# Patient Record
Sex: Male | Born: 1980 | Race: White | Hispanic: No | Marital: Single | Smoking: Current every day smoker
Health system: Southern US, Community
[De-identification: ages and names within clinical notes are randomized; demographics above are authoritative.]

## PROBLEM LIST (undated history)

## (undated) ENCOUNTER — Emergency Department (HOSPITAL_COMMUNITY): Admission: EM | Discharge status: Absent without leave | Payer: Self-pay

## (undated) DIAGNOSIS — D649 Anemia, unspecified: Secondary | ICD-10-CM

## (undated) DIAGNOSIS — G43909 Migraine, unspecified, not intractable, without status migrainosus: Secondary | ICD-10-CM

## (undated) DIAGNOSIS — M1990 Unspecified osteoarthritis, unspecified site: Secondary | ICD-10-CM

## (undated) DIAGNOSIS — K5900 Constipation, unspecified: Secondary | ICD-10-CM

## (undated) DIAGNOSIS — N179 Acute kidney failure, unspecified: Secondary | ICD-10-CM

## (undated) DIAGNOSIS — R519 Headache, unspecified: Secondary | ICD-10-CM

## (undated) DIAGNOSIS — L409 Psoriasis, unspecified: Secondary | ICD-10-CM

## (undated) DIAGNOSIS — F1120 Opioid dependence, uncomplicated: Secondary | ICD-10-CM

## (undated) DIAGNOSIS — K219 Gastro-esophageal reflux disease without esophagitis: Secondary | ICD-10-CM

## (undated) DIAGNOSIS — N189 Chronic kidney disease, unspecified: Secondary | ICD-10-CM

## (undated) DIAGNOSIS — F419 Anxiety disorder, unspecified: Secondary | ICD-10-CM

## (undated) DIAGNOSIS — K759 Inflammatory liver disease, unspecified: Secondary | ICD-10-CM

## (undated) DIAGNOSIS — IMO0001 Reserved for inherently not codable concepts without codable children: Secondary | ICD-10-CM

## (undated) DIAGNOSIS — I5189 Other ill-defined heart diseases: Secondary | ICD-10-CM

## (undated) DIAGNOSIS — D696 Thrombocytopenia, unspecified: Secondary | ICD-10-CM

## (undated) DIAGNOSIS — R51 Headache: Secondary | ICD-10-CM

## (undated) DIAGNOSIS — Z992 Dependence on renal dialysis: Secondary | ICD-10-CM

## (undated) DIAGNOSIS — F1910 Other psychoactive substance abuse, uncomplicated: Secondary | ICD-10-CM

## (undated) DIAGNOSIS — I10 Essential (primary) hypertension: Secondary | ICD-10-CM

## (undated) HISTORY — PX: OTHER SURGICAL HISTORY: SHX169

---

## 2007-08-22 ENCOUNTER — Emergency Department (HOSPITAL_COMMUNITY): Admission: EM | Admit: 2007-08-22 | Discharge: 2007-08-22 | Payer: Self-pay | Admitting: Emergency Medicine

## 2014-10-18 DIAGNOSIS — K759 Inflammatory liver disease, unspecified: Secondary | ICD-10-CM

## 2014-10-18 HISTORY — DX: Inflammatory liver disease, unspecified: K75.9

## 2014-12-25 ENCOUNTER — Emergency Department (HOSPITAL_COMMUNITY): Payer: Medicaid Other

## 2014-12-25 ENCOUNTER — Encounter (HOSPITAL_COMMUNITY): Payer: Self-pay

## 2014-12-25 ENCOUNTER — Emergency Department (HOSPITAL_COMMUNITY)
Admission: EM | Admit: 2014-12-25 | Discharge: 2014-12-25 | Disposition: A | Discharge status: Home / Self Care | Payer: Medicaid Other | Attending: Emergency Medicine | Admitting: Emergency Medicine

## 2014-12-25 DIAGNOSIS — I10 Essential (primary) hypertension: Secondary | ICD-10-CM

## 2014-12-25 DIAGNOSIS — R519 Headache, unspecified: Secondary | ICD-10-CM

## 2014-12-25 DIAGNOSIS — Z720 Tobacco use: Secondary | ICD-10-CM | POA: Diagnosis not present

## 2014-12-25 DIAGNOSIS — Z79899 Other long term (current) drug therapy: Secondary | ICD-10-CM | POA: Insufficient documentation

## 2014-12-25 DIAGNOSIS — Z880 Allergy status to penicillin: Secondary | ICD-10-CM | POA: Insufficient documentation

## 2014-12-25 DIAGNOSIS — R51 Headache: Secondary | ICD-10-CM | POA: Insufficient documentation

## 2014-12-25 DIAGNOSIS — N289 Disorder of kidney and ureter, unspecified: Secondary | ICD-10-CM | POA: Insufficient documentation

## 2014-12-25 DIAGNOSIS — Z872 Personal history of diseases of the skin and subcutaneous tissue: Secondary | ICD-10-CM | POA: Diagnosis not present

## 2014-12-25 HISTORY — DX: Psoriasis, unspecified: L40.9

## 2014-12-25 LAB — CBC WITH DIFFERENTIAL/PLATELET
BASOS ABS: 0.1 10*3/uL (ref 0.0–0.1)
BASOS PCT: 1 % (ref 0–1)
EOS PCT: 1 % (ref 0–5)
Eosinophils Absolute: 0.1 10*3/uL (ref 0.0–0.7)
HEMATOCRIT: 42.9 % (ref 39.0–52.0)
Hemoglobin: 15 g/dL (ref 13.0–17.0)
LYMPHS ABS: 1.9 10*3/uL (ref 0.7–4.0)
LYMPHS PCT: 30 % (ref 12–46)
MCH: 30.3 pg (ref 26.0–34.0)
MCHC: 35 g/dL (ref 30.0–36.0)
MCV: 86.7 fL (ref 78.0–100.0)
Monocytes Absolute: 0.6 10*3/uL (ref 0.1–1.0)
Monocytes Relative: 10 % (ref 3–12)
Neutro Abs: 3.7 10*3/uL (ref 1.7–7.7)
Neutrophils Relative %: 58 % (ref 43–77)
Platelets: 249 10*3/uL (ref 150–400)
RBC: 4.95 MIL/uL (ref 4.22–5.81)
RDW: 11.8 % (ref 11.5–15.5)
WBC: 6.4 10*3/uL (ref 4.0–10.5)

## 2014-12-25 LAB — BASIC METABOLIC PANEL
ANION GAP: 9 (ref 5–15)
BUN: 35 mg/dL — AB (ref 6–23)
CALCIUM: 9.3 mg/dL (ref 8.4–10.5)
CO2: 29 mmol/L (ref 19–32)
Chloride: 95 mmol/L — ABNORMAL LOW (ref 96–112)
Creatinine, Ser: 2.42 mg/dL — ABNORMAL HIGH (ref 0.50–1.35)
GFR calc non Af Amer: 33 mL/min — ABNORMAL LOW (ref >90)
GFR, EST AFRICAN AMERICAN: 38 mL/min — AB (ref >90)
GLUCOSE: 145 mg/dL — AB (ref 70–99)
Potassium: 3.7 mmol/L (ref 3.5–5.1)
Sodium: 133 mmol/L — ABNORMAL LOW (ref 135–145)

## 2014-12-25 MED ORDER — ONDANSETRON HCL 4 MG/2ML IJ SOLN
4.0000 mg | Freq: Once | INTRAMUSCULAR | Status: AC
  Administered 2014-12-25: 4 mg via INTRAVENOUS
  Filled 2014-12-25: qty 2

## 2014-12-25 MED ORDER — METOCLOPRAMIDE HCL 5 MG/ML IJ SOLN
10.0000 mg | Freq: Once | INTRAMUSCULAR | Status: AC
  Administered 2014-12-25: 10 mg via INTRAVENOUS
  Filled 2014-12-25: qty 2

## 2014-12-25 MED ORDER — ENALAPRILAT 1.25 MG/ML IV SOLN
1.2500 mg | Freq: Once | INTRAVENOUS | Status: AC
  Administered 2014-12-25: 1.25 mg via INTRAVENOUS
  Filled 2014-12-25: qty 2

## 2014-12-25 MED ORDER — CLONIDINE HCL 0.1 MG PO TABS: 0.1000 mg | ORAL_TABLET | Freq: Two times a day (BID) | ORAL | Status: DC

## 2014-12-25 MED ORDER — MORPHINE SULFATE 4 MG/ML IJ SOLN: 4.0000 mg | INTRAMUSCULAR | Status: DC | PRN

## 2014-12-25 MED ORDER — SODIUM CHLORIDE 0.9 % IV BOLUS (SEPSIS)
1000.0000 mL | Freq: Once | INTRAVENOUS | Status: AC
  Administered 2014-12-25: 1000 mL via INTRAVENOUS

## 2014-12-25 MED ORDER — VALPROATE SODIUM 500 MG/5ML IV SOLN
500.0000 mg | Freq: Once | INTRAVENOUS | Status: AC
  Administered 2014-12-25: 500 mg via INTRAVENOUS
  Filled 2014-12-25: qty 5

## 2014-12-25 MED ORDER — ONDANSETRON 4 MG PO TBDP: 4.0000 mg | ORAL_TABLET | Freq: Three times a day (TID) | ORAL | Status: DC | PRN

## 2014-12-25 MED ORDER — KETOROLAC TROMETHAMINE 30 MG/ML IJ SOLN
30.0000 mg | Freq: Once | INTRAMUSCULAR | Status: AC
  Administered 2014-12-25: 30 mg via INTRAVENOUS
  Filled 2014-12-25: qty 1

## 2014-12-25 NOTE — ED Notes (Signed)
Pt c/o constant headache x 4 days.  Reports vomiting the past 2 days.  Pt says took someone else's migraine medication last night and says it helped a little.

## 2014-12-25 NOTE — Discharge Instructions (Signed)
Take prescribed blood pressure medicine. Do not take aspirin, Motrin, naproxen, BC powders. Tylenol only for pain. Check your kidney function blood tests and a follow-up appointment with primary care physician within the week. Return if any worsening vomiting, swelling in her feet, shortness of breath, return of headache, or uncontrolled blood pressure.  [facility] Primary Care Doctor List    Sinda Du MD. Specialty: Pulmonary Disease Contact information: [address]  [place] [address]  [phone]   Tula Nakayama, MD. Specialty: [facility] Medicine Contact information: [address] [place] [address]  [phone]   Sallee Lange, MD. Specialty: [facility] Medicine Contact information: [address]  [place] [address]  [phone]   Rosita Fire, MD Specialty: Internal Medicine Contact information: [place] [address]  [phone]   Delphina Cahill, MD. Specialty: Internal Medicine Contact information: [place] [address]  [phone]   Marjean Donna, MD. Specialty: Family Medicine Contact information: [place] [address]  [phone]   Leslie Andrea, MD. Specialty: Family Medicine Contact information: [place] [address]  [phone]   Asencion Noble, MD. Specialty: Internal Medicine Contact information: [address]  [place] [address]  [phone]     General Headache Without Cause A headache is pain or discomfort felt around the head or neck area. The specific cause of a headache may not be found. There are many causes and types of headaches. A few common ones are:  Tension headaches.  Migraine headaches.  Cluster headaches.  Chronic daily headaches. HOME CARE INSTRUCTIONS   Keep all follow-up appointments with your caregiver or any specialist referral.  Only take over-the-counter  or prescription medicines for pain or discomfort as directed by your caregiver.  Lie down in a dark, quiet room when you have a headache.  Keep a headache journal to find out what may trigger your migraine headaches. For example, write down:  What you eat and drink.  How much sleep you get.  Any change to your diet or medicines.  Try massage or other relaxation techniques.  Put ice packs or heat on the head and neck. Use these 3 to 4 times per day for 15 to 20 minutes each time, or as needed.  Limit stress.  Sit up straight, and do not tense your muscles.  Quit smoking if you smoke.  Limit alcohol use.  Decrease the amount of caffeine you drink, or stop drinking caffeine.  Eat and sleep on a regular schedule.  Get 7 to 9 hours of sleep, or as recommended by your caregiver.  Keep lights dim if bright lights bother you and make your headaches worse. SEEK MEDICAL CARE IF:   You have problems with the medicines you were prescribed.  Your medicines are not working.  You have a change from the usual headache.  You have nausea or vomiting. SEEK IMMEDIATE MEDICAL CARE IF:   Your headache becomes severe.  You have a fever.  You have a stiff neck.  You have loss of vision.  You have muscular weakness or loss of muscle control.  You start losing your balance or have trouble walking.  You feel faint or pass out.  You have severe symptoms that are different from your first symptoms. MAKE SURE YOU:   Understand these instructions.  Will watch your condition.  Will get help right away if you are not doing well or get worse. Document Released: 10/04/2005 Document Revised: 12/27/2011 Document Reviewed: 10/20/2011 [facility] Patient Information 2015 [place], [place]. This information is not intended to replace advice given to you by your health care provider. Make sure you discuss any questions you have with your health care provider.  Hypertension Hypertension is  another name for high blood pressure. High blood pressure forces your heart to work harder to pump blood. A blood pressure reading has two numbers, which includes a higher number over a lower number (example: 110/72). HOME CARE   Have your blood pressure rechecked by your doctor.  Only take medicine as told by your doctor. Follow the directions carefully. The medicine does not work as well if you skip doses. Skipping doses also puts you at risk for problems.  Do not smoke.  Monitor your blood pressure at home as told by your doctor. GET HELP IF:  You think you are having a reaction to the medicine you are taking.  You have repeat headaches or feel dizzy.  You have puffiness (swelling) in your ankles.  You have trouble with your vision. GET HELP RIGHT AWAY IF:   You get a very bad headache and are confused.  You feel weak, numb, or faint.  You get chest or belly (abdominal) pain.  You throw up (vomit).  You cannot breathe very well. MAKE SURE YOU:   Understand these instructions.  Will watch your condition.  Will get help right away if you are not doing well or get worse. Document Released: 03/22/2008 Document Revised: 10/09/2013 Document Reviewed: 07/27/2013 [facility] Patient Information 2015 [place], [place]. This information is not intended to replace advice given to you by your health care provider. Make sure you discuss any questions you have with your health care provider.  Kidney Failure +  Your kidneys show signs of not removing all of the waste products in your blood. This can be reversible, when it is related to recent medication use, or dehydration. +  This can also be caused by use of anti-inflammatory medicines such as PCP powder, Motrin, aspirin, Naprosyn. +  Uncontrolled hypertension can cause renal insufficiency or renal failure as well. HOME CARE  Follow your diet as told by your doctor.  Take all medicines as told by your doctor.   GET HELP RIGHT AWAY  IF:   You make a lot more or very little pee (urine).  Your face or ankles puff up (swell).  You develop shortness of breath.  You develop weakness, feel tired, or you do not feel hungry (appetite loss).  You feel poorly for no known reason. MAKE SURE YOU:   Understand these instructions.  Will watch your condition.  Will get help right away if you are not doing well or get worse. Document Released: 12/29/2009 Document Revised: 12/27/2011 Document Reviewed: 02/04/2010 [facility] Patient Information 2015 [place], [place]. This information is not intended to replace advice given to you by your health care provider. Make sure you discuss any questions you have with your health care provider.

## 2014-12-25 NOTE — ED Provider Notes (Signed)
CSN: [id number]     Arrival date & time 12/25/14  1430 History   This chart was scribed for Tanna Furry, MD by Einar Pheasant, ED Scribe. This patient was seen in room APA06/APA06 and the patient's care was started at 3:08 PM.    Chief Complaint  Patient presents with  . Headache   The history is provided by the patient and medical records. No language interpreter was used.   HPI Comments: Terrance Reynolds is a 34 y.o. male who presents to the Emergency Department complaining of gradual onset persistent temporal and frontal HA that has been going on for approximately 4 days. He reports an associated elevated BP, which he states has been running around 0000000 with systolic number. He endorses some mild photophobia and intermittent leg swelling. Pt reports some episodes of emesis yesterday with persistent nausea today. He states that in the past he has seen here in the ED for elevated BP for which he was given fluid pills for. Pt reports taking tylenol, Advil, BC's, and other OTC medication without adequate relief of his symptoms. He admits to drinking 4 caffeinated drinks daily. Pt denies fever, neck pain, sore throat, visual disturbance, CP, cough, SOB, abdominal pain,  diarrhea, urinary symptoms, back pain, weakness, numbness and rash as associated symptoms.    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 He reports taking some oxycodone mixed with morphine orally this morning.  Past Medical History  Diagnosis Date  . Psoriasis    History reviewed. No pertinent past surgical history. No family history on file. History  Substance Use Topics  . Smoking status: Current Every Day Smoker  . Smokeless tobacco: Not on file  . Alcohol Use: No    Review of Systems  Constitutional: Negative for fever, chills, diaphoresis, appetite change and fatigue.  HENT: Negative for mouth sores, sore throat and trouble swallowing.   Eyes: Positive for photophobia. Negative for visual disturbance.   Respiratory: Negative for cough, chest tightness, shortness of breath and wheezing.   Cardiovascular: Positive for leg swelling. Negative for chest pain.  Gastrointestinal: Positive for nausea and vomiting. Negative for abdominal pain, diarrhea and abdominal distention.  Endocrine: Negative for polydipsia, polyphagia and polyuria.  Genitourinary: Negative for dysuria, frequency and hematuria.  Musculoskeletal: Negative for gait problem.  Skin: Negative for color change, pallor and rash.  Neurological: Positive for headaches. Negative for dizziness, syncope and light-headedness.  Hematological: Does not bruise/bleed easily.  Psychiatric/Behavioral: Negative for behavioral problems and confusion.   Allergies  Penicillins  Home Medications   Prior to Admission medications   Medication Sig Start Date End Date Taking? Authorizing Provider  cloNIDine (CATAPRES) 0.1 MG tablet Take 1 tablet (0.1 mg total) by mouth 2 (two) times daily. 12/25/14   Tanna Furry, MD  ondansetron (ZOFRAN ODT) 4 MG disintegrating tablet Take 1 tablet (4 mg total) by mouth every 8 (eight) hours as needed for nausea. 12/25/14   Tanna Furry, MD   BP 197/142 mmHg  Pulse 116  Temp(Src) 97.8 F (36.6 C) (Oral)  Resp 18  Ht 6\' 1"  (1.854 m)  Wt 185 lb (83.915 kg)  BMI 24.41 kg/m2  SpO2 96%  Physical Exam  Constitutional: He is oriented to person, place, and time. He appears well-developed and well-nourished. No distress.  HENT:  Head: Normocephalic.  Eyes: Conjunctivae are normal. Pupils are equal, round, and reactive to light. No scleral icterus.  Neck: Normal range of motion. Neck supple. No thyromegaly present.  Cardiovascular:  Normal rate and regular rhythm.  Exam reveals no gallop and no friction rub.   No murmur heard. Pulmonary/Chest: Effort normal and breath sounds normal. No respiratory distress. He has no wheezes. He has no rales.  Abdominal: Soft. Bowel sounds are normal. He exhibits no distension. There is  no tenderness. There is no rebound.  Musculoskeletal: Normal range of motion.  No leg swelling today  Neurological: He is alert and oriented to person, place, and time.  Skin: Skin is warm and dry. No rash noted.  Psychiatric: He has a normal mood and affect. His behavior is normal.    ED Course  Procedures (including critical care time)  DIAGNOSTIC STUDIES: Oxygen Saturation is 96% on RA, adequate by my interpretation.    COORDINATION OF CARE: 3:13 PM- Pt advised of plan for treatment and pt agrees.  3:15 PM- pt will not be treated with narcotics secondary to his substance abuse history. Pt advised and is compliant.   Labs Review Labs Reviewed  BASIC METABOLIC PANEL - Abnormal; Notable for the following:    Sodium 133 (*)    Chloride 95 (*)    Glucose, Bld 145 (*)    BUN 35 (*)    Creatinine, Ser 2.42 (*)    GFR calc non Af Amer 33 (*)    GFR calc Af Amer 38 (*)    All other components within normal limits  CBC WITH DIFFERENTIAL/PLATELET    Imaging Review No results found.   EKG Interpretation   Date/Time:  Wednesday December 25 2014 15:04:31 EST Ventricular Rate:  100 PR Interval:  158 QRS Duration: 97 QT Interval:  392 QTC Calculation: 506 R Axis:   -71 Text Interpretation:  Sinus tachycardia Probable left atrial enlargement  Prolonged QT interval Confirmed by Jeneen Rinks  MD, [place] ([id number]) on 12/25/2014  3:12:22 PM      MDM   Final diagnoses:  Headache  Essential hypertension  Renal insufficiency    On recheck patient states his headache is relieved "almost completely". Recheck blood pressure 144/97. CT head without acute abnormalities.  BUN slightly high at 35, creatinine 2.42.  I discussed with him the etiology of his elevated creatinine. This may be partially related to his over-the-counter "fluid pills" that he started taking a week ago. This may be related to his anti-inflammatory use for his headache, likely related to chronic hypertension as well.  Was  given a liter fluid here. He is taking by mouth without difficulty. I stressed to him the importance of blood pressure control, hydration, avoiding any additional renal insult such as anti-inflammatories.  Think it is safe and appropriate plan to treat his blood pressure, ask him to continue to orally hydrate, and be seen by primary care physician within the week.  If unable to be seen by primary care, or if he has any recurrence of symptoms with nausea, extremity swelling, or other symptoms and recheck here.   I personally performed the services described in this documentation, which was scribed in my presence. The recorded information has been reviewed and is accurate.    Tanna Furry, MD 12/30/14 [phone]

## 2015-03-02 ENCOUNTER — Emergency Department (HOSPITAL_COMMUNITY)
Admission: EM | Admit: 2015-03-02 | Discharge: 2015-03-02 | Disposition: A | Discharge status: Home / Self Care | Payer: Medicaid Other | Attending: Emergency Medicine | Admitting: Emergency Medicine

## 2015-03-02 ENCOUNTER — Encounter (HOSPITAL_COMMUNITY): Payer: Self-pay | Admitting: Emergency Medicine

## 2015-03-02 DIAGNOSIS — Z79899 Other long term (current) drug therapy: Secondary | ICD-10-CM | POA: Insufficient documentation

## 2015-03-02 DIAGNOSIS — Z880 Allergy status to penicillin: Secondary | ICD-10-CM | POA: Insufficient documentation

## 2015-03-02 DIAGNOSIS — R030 Elevated blood-pressure reading, without diagnosis of hypertension: Secondary | ICD-10-CM | POA: Diagnosis not present

## 2015-03-02 DIAGNOSIS — L237 Allergic contact dermatitis due to plants, except food: Secondary | ICD-10-CM | POA: Diagnosis not present

## 2015-03-02 DIAGNOSIS — R21 Rash and other nonspecific skin eruption: Secondary | ICD-10-CM | POA: Diagnosis present

## 2015-03-02 DIAGNOSIS — IMO0001 Reserved for inherently not codable concepts without codable children: Secondary | ICD-10-CM

## 2015-03-02 DIAGNOSIS — Z720 Tobacco use: Secondary | ICD-10-CM | POA: Diagnosis not present

## 2015-03-02 MED ORDER — DIPHENHYDRAMINE HCL 25 MG PO CAPS
50.0000 mg | ORAL_CAPSULE | Freq: Once | ORAL | Status: AC
  Administered 2015-03-02: 50 mg via ORAL
  Filled 2015-03-02: qty 2

## 2015-03-02 MED ORDER — ONDANSETRON 8 MG PO TBDP: 8.0000 mg | ORAL_TABLET | Freq: Once | ORAL | Status: DC

## 2015-03-02 MED ORDER — DEXAMETHASONE SODIUM PHOSPHATE 10 MG/ML IJ SOLN
10.0000 mg | Freq: Once | INTRAMUSCULAR | Status: AC
  Administered 2015-03-02: 10 mg via INTRAMUSCULAR
  Filled 2015-03-02: qty 1

## 2015-03-02 MED ORDER — PREDNISONE 10 MG PO TABS: ORAL_TABLET | ORAL | Status: DC

## 2015-03-02 NOTE — Discharge Instructions (Signed)
Poison [employer] ivy is a inflammation of the skin (contact dermatitis) caused by touching the allergens on the leaves of the ivy plant following previous exposure to the plant. The rash usually appears 48 hours after exposure. The rash is usually bumps (papules) or blisters (vesicles) in a linear pattern. Depending on your own sensitivity, the rash may simply cause redness and itching, or it may also progress to blisters which may break open. These must be well cared for to prevent secondary bacterial (germ) infection, followed by scarring. Keep any open areas dry, clean, dressed, and covered with an antibacterial ointment if needed. The eyes may also get puffy. The puffiness is worst in the morning and gets better as the day progresses. This dermatitis usually heals without scarring, within 2 to 3 weeks without treatment. HOME CARE INSTRUCTIONS  Thoroughly wash with soap and water as soon as you have been exposed to poison ivy. You have about one half hour to remove the plant resin before it will cause the rash. This washing will destroy the oil or antigen on the skin that is causing, or will cause, the rash. Be sure to wash under your fingernails as any plant resin there will continue to spread the rash. Do not rub skin vigorously when washing affected area. Poison ivy cannot spread if no oil from the plant remains on your body. A rash that has progressed to weeping sores will not spread the rash unless you have not washed thoroughly. It is also important to wash any clothes you have been wearing as these may carry active allergens. The rash will return if you wear the unwashed clothing, even several days later. Avoidance of the plant in the future is the best measure. Poison ivy plant can be recognized by the number of leaves. Generally, poison ivy has three leaves with flowering branches on a single stem. Diphenhydramine may be purchased over the counter and used as needed for itching. Do not drive with  this medication if it makes you drowsy.Ask your caregiver about medication for children. SEEK MEDICAL CARE IF:  Open sores develop.  Redness spreads beyond area of rash.  You notice purulent (pus-like) discharge.  You have increased pain.  Other signs of infection develop (such as fever). Document Released: 10/01/2000 Document Revised: 12/27/2011 Document Reviewed: 03/14/2009 [facility] Patient Information 2015 [place], [place]. This information is not intended to replace advice given to you by your health care provider. Make sure you discuss any questions you have with your health care provider.  Hypertension Hypertension is another name for high blood pressure. High blood pressure forces your heart to work harder to pump blood. A blood pressure reading has two numbers, which includes a higher number over a lower number (example: 110/72). HOME CARE   Have your blood pressure rechecked by your doctor.  Only take medicine as told by your doctor. Follow the directions carefully. The medicine does not work as well if you skip doses. Skipping doses also puts you at risk for problems.  Do not smoke.  Monitor your blood pressure at home as told by your doctor. GET HELP IF:  You think you are having a reaction to the medicine you are taking.  You have repeat headaches or feel dizzy.  You have puffiness (swelling) in your ankles.  You have trouble with your vision. GET HELP RIGHT AWAY IF:   You get a very bad headache and are confused.  You feel weak, numb, or faint.  You get chest or  belly (abdominal) pain.  You throw up (vomit).  You cannot breathe very well. MAKE SURE YOU:   Understand these instructions.  Will watch your condition.  Will get help right away if you are not doing well or get worse. Document Released: 03/22/2008 Document Revised: 10/09/2013 Document Reviewed: 07/27/2013 [facility] Patient Information 2015 [place], [place]. This information is not intended to  replace advice given to you by your health care provider. Make sure you discuss any questions you have with your health care provider.   Start taking the prednisone prescription on Tuesday, as the shot given tonight will cover you for the next 24 hours.  Use cool compresses, even ice can help with itching.  I recommend caladryl for the areas that are oozing (will dry up plus the benadryl in this product can help with itch.).  You may also take oral benadryl if needed for itch relief.  Get rechecked for any worsened symptoms.  Your blood pressure is elevated tonight at 177/102.  You should have this rechecked when you are feeling better - this may be elevated from being aggravated with your rash.

## 2015-03-02 NOTE — ED Notes (Signed)
Patient states was weed eating Friday after and has rash all over with itching since.

## 2015-03-03 NOTE — ED Provider Notes (Signed)
CSN: [id number]     Arrival date & time 03/02/15  1916 History   First MD Initiated Contact with Patient 03/02/15 2036     Chief Complaint  Patient presents with  . Rash     (Consider location/radiation/quality/duration/timing/severity/associated sxs/prior Treatment) Patient is a 34 y.o. male presenting with rash. The history is provided by the patient.  Rash Location:  Shoulder/arm, leg and torso Quality: blistering, itchiness, redness and weeping   Severity:  Moderate Onset quality:  Gradual Duration:  2 days Timing:  Constant Progression:  Worsening Chronicity:  New Context: plant contact   Context comment:  He was weed eating 2 days ago and suspects he was exposed to poison oak Relieved by:  Nothing Worsened by:  Nothing tried Ineffective treatments:  Topical steroids Associated symptoms: no abdominal pain, no fever, no nausea, no periorbital edema, no shortness of breath, no throat swelling, no tongue swelling and not wheezing     Past Medical History  Diagnosis Date  . Psoriasis    History reviewed. No pertinent past surgical history. History reviewed. No pertinent family history. History  Substance Use Topics  . Smoking status: Current Every Day Smoker  . Smokeless tobacco: Not on file  . Alcohol Use: No    Review of Systems  Constitutional: Negative for fever and chills.  Respiratory: Negative for shortness of breath and wheezing.   Gastrointestinal: Negative for nausea and abdominal pain.  Skin: Positive for rash.  Neurological: Negative for numbness.      Allergies  Penicillins  Home Medications   Prior to Admission medications   Medication Sig Start Date End Date Taking? Authorizing Provider  cloNIDine (CATAPRES) 0.1 MG tablet Take 1 tablet (0.1 mg total) by mouth 2 (two) times daily. 12/25/14   Tanna Furry, MD  ondansetron (ZOFRAN ODT) 4 MG disintegrating tablet Take 1 tablet (4 mg total) by mouth every 8 (eight) hours as needed for nausea. 12/25/14    Tanna Furry, MD  predniSONE (DELTASONE) 10 MG tablet 6,6, 5, 5, 4, 3, 2 then 1 tablet by mouth daily for 6 days total. 03/02/15   Evalee Jefferson, PA-C   BP 177/102 mmHg  Pulse 79  Temp(Src) 98.2 F (36.8 C) (Oral)  Resp 20  Ht 6\' 2"  (1.88 m)  Wt 190 lb (86.183 kg)  BMI 24.38 kg/m2  SpO2 100% Physical Exam  Constitutional: He appears well-developed and well-nourished. No distress.  Elevated bp.  HENT:  Head: Normocephalic.  Neck: Neck supple.  Cardiovascular: Normal rate.   Pulmonary/Chest: Effort normal. He has no wheezes.  Musculoskeletal: Normal range of motion. He exhibits no edema.  Skin: Rash noted. Rash is vesicular.  Scattered patches of small vesicles on large swathes of erythematous macular patches on all 4 extremities, also on abdomen and lower back, bilateral neck.    ED Course  Procedures (including critical care time) Labs Review Labs Reviewed - No data to display  Imaging Review No results found.   EKG Interpretation None      MDM   Final diagnoses:  Contact dermatitis due to poison ivy  Elevated blood pressure    Suspect pt actually used the trimmer on the poison plant, causing spray of the plant oils, given his  widespread  Involvement. Pt was given decadron injection and benadryl tablet here (has driver).  Prescribed 10 day prednisone taper to start tomorrow.  Cool soaks, caladryl to dry the weeping areas.  Gold bond anti itch cream, oral benadryl. F/u for a recheck for any  worsened sx.      Evalee Jefferson, PA-C 03/03/15 1347  Dorie Rank, MD 03/06/15 1159

## 2015-04-01 ENCOUNTER — Inpatient Hospital Stay (HOSPITAL_COMMUNITY): Payer: Medicaid Other

## 2015-04-01 ENCOUNTER — Inpatient Hospital Stay (HOSPITAL_COMMUNITY)
Admission: EM | Admit: 2015-04-01 | Discharge: 2015-04-07 | DRG: 683 | Disposition: A | Discharge status: Home / Self Care | Payer: Medicaid Other | Attending: Internal Medicine | Admitting: Internal Medicine

## 2015-04-01 ENCOUNTER — Inpatient Hospital Stay (HOSPITAL_BASED_OUTPATIENT_CLINIC_OR_DEPARTMENT_OTHER): Payer: Medicaid Other

## 2015-04-01 ENCOUNTER — Encounter (HOSPITAL_COMMUNITY): Payer: Self-pay

## 2015-04-01 DIAGNOSIS — F1910 Other psychoactive substance abuse, uncomplicated: Secondary | ICD-10-CM | POA: Diagnosis present

## 2015-04-01 DIAGNOSIS — R51 Headache: Secondary | ICD-10-CM

## 2015-04-01 DIAGNOSIS — N183 Chronic kidney disease, stage 3 (moderate): Secondary | ICD-10-CM | POA: Diagnosis present

## 2015-04-01 DIAGNOSIS — D509 Iron deficiency anemia, unspecified: Secondary | ICD-10-CM | POA: Diagnosis present

## 2015-04-01 DIAGNOSIS — Z9114 Patient's other noncompliance with medication regimen: Secondary | ICD-10-CM | POA: Diagnosis present

## 2015-04-01 DIAGNOSIS — R0602 Shortness of breath: Secondary | ICD-10-CM

## 2015-04-01 DIAGNOSIS — Z8249 Family history of ischemic heart disease and other diseases of the circulatory system: Secondary | ICD-10-CM

## 2015-04-01 DIAGNOSIS — I5189 Other ill-defined heart diseases: Secondary | ICD-10-CM

## 2015-04-01 DIAGNOSIS — R519 Headache, unspecified: Secondary | ICD-10-CM

## 2015-04-01 DIAGNOSIS — R0600 Dyspnea, unspecified: Secondary | ICD-10-CM | POA: Diagnosis present

## 2015-04-01 DIAGNOSIS — Z79891 Long term (current) use of opiate analgesic: Secondary | ICD-10-CM

## 2015-04-01 DIAGNOSIS — Z9119 Patient's noncompliance with other medical treatment and regimen: Secondary | ICD-10-CM | POA: Diagnosis present

## 2015-04-01 DIAGNOSIS — E871 Hypo-osmolality and hyponatremia: Secondary | ICD-10-CM | POA: Diagnosis present

## 2015-04-01 DIAGNOSIS — E860 Dehydration: Secondary | ICD-10-CM | POA: Diagnosis present

## 2015-04-01 DIAGNOSIS — R778 Other specified abnormalities of plasma proteins: Secondary | ICD-10-CM | POA: Diagnosis present

## 2015-04-01 DIAGNOSIS — D890 Polyclonal hypergammaglobulinemia: Secondary | ICD-10-CM | POA: Diagnosis present

## 2015-04-01 DIAGNOSIS — E876 Hypokalemia: Secondary | ICD-10-CM | POA: Diagnosis present

## 2015-04-01 DIAGNOSIS — B182 Chronic viral hepatitis C: Secondary | ICD-10-CM | POA: Diagnosis present

## 2015-04-01 DIAGNOSIS — R600 Localized edema: Secondary | ICD-10-CM | POA: Diagnosis present

## 2015-04-01 DIAGNOSIS — N179 Acute kidney failure, unspecified: Principal | ICD-10-CM | POA: Diagnosis present

## 2015-04-01 DIAGNOSIS — D594 Other nonautoimmune hemolytic anemias: Secondary | ICD-10-CM | POA: Diagnosis present

## 2015-04-01 DIAGNOSIS — D649 Anemia, unspecified: Secondary | ICD-10-CM | POA: Diagnosis present

## 2015-04-01 DIAGNOSIS — D696 Thrombocytopenia, unspecified: Secondary | ICD-10-CM | POA: Diagnosis present

## 2015-04-01 DIAGNOSIS — Z8619 Personal history of other infectious and parasitic diseases: Secondary | ICD-10-CM

## 2015-04-01 DIAGNOSIS — I129 Hypertensive chronic kidney disease with stage 1 through stage 4 chronic kidney disease, or unspecified chronic kidney disease: Secondary | ICD-10-CM | POA: Diagnosis present

## 2015-04-01 DIAGNOSIS — N189 Chronic kidney disease, unspecified: Secondary | ICD-10-CM

## 2015-04-01 DIAGNOSIS — I169 Hypertensive crisis, unspecified: Secondary | ICD-10-CM | POA: Diagnosis present

## 2015-04-01 DIAGNOSIS — R112 Nausea with vomiting, unspecified: Secondary | ICD-10-CM | POA: Diagnosis present

## 2015-04-01 DIAGNOSIS — B1920 Unspecified viral hepatitis C without hepatic coma: Secondary | ICD-10-CM | POA: Diagnosis present

## 2015-04-01 DIAGNOSIS — F17210 Nicotine dependence, cigarettes, uncomplicated: Secondary | ICD-10-CM | POA: Diagnosis present

## 2015-04-01 DIAGNOSIS — R7989 Other specified abnormal findings of blood chemistry: Secondary | ICD-10-CM

## 2015-04-01 HISTORY — DX: Chronic kidney disease, unspecified: N18.9

## 2015-04-01 HISTORY — DX: Chronic kidney disease, unspecified: N17.9

## 2015-04-01 HISTORY — DX: Essential (primary) hypertension: I10

## 2015-04-01 HISTORY — DX: Other ill-defined heart diseases: I51.89

## 2015-04-01 HISTORY — DX: Other psychoactive substance abuse, uncomplicated: F19.10

## 2015-04-01 HISTORY — DX: Thrombocytopenia, unspecified: D69.6

## 2015-04-01 HISTORY — DX: Anemia, unspecified: D64.9

## 2015-04-01 LAB — RAPID URINE DRUG SCREEN, HOSP PERFORMED
Amphetamines: NOT DETECTED
BARBITURATES: NOT DETECTED
Benzodiazepines: NOT DETECTED
Cocaine: NOT DETECTED
OPIATES: NOT DETECTED
Tetrahydrocannabinol: NOT DETECTED

## 2015-04-01 LAB — COMPREHENSIVE METABOLIC PANEL
ALK PHOS: 92 U/L (ref 38–126)
ALT: 46 U/L (ref 17–63)
AST: 35 U/L (ref 15–41)
Albumin: 3.4 g/dL — ABNORMAL LOW (ref 3.5–5.0)
Anion gap: 16 — ABNORMAL HIGH (ref 5–15)
BUN: 58 mg/dL — ABNORMAL HIGH (ref 6–20)
CHLORIDE: 95 mmol/L — AB (ref 101–111)
CO2: 26 mmol/L (ref 22–32)
Calcium: 8.2 mg/dL — ABNORMAL LOW (ref 8.9–10.3)
Creatinine, Ser: 6.83 mg/dL — ABNORMAL HIGH (ref 0.61–1.24)
GFR calc Af Amer: 11 mL/min — ABNORMAL LOW (ref >60)
GFR calc non Af Amer: 9 mL/min — ABNORMAL LOW (ref >60)
GLUCOSE: 96 mg/dL (ref 65–99)
POTASSIUM: 3.9 mmol/L (ref 3.5–5.1)
SODIUM: 137 mmol/L (ref 135–145)
Total Bilirubin: 0.6 mg/dL (ref 0.3–1.2)
Total Protein: 6.7 g/dL (ref 6.5–8.1)

## 2015-04-01 LAB — URINALYSIS, ROUTINE W REFLEX MICROSCOPIC
BILIRUBIN URINE: NEGATIVE
Glucose, UA: 100 mg/dL — AB
Ketones, ur: NEGATIVE mg/dL
Leukocytes, UA: NEGATIVE
Nitrite: NEGATIVE
Protein, ur: >300 mg/dL — AB
SPECIFIC GRAVITY, URINE: 1.02 (ref 1.005–1.030)
UROBILINOGEN UA: 0.2 mg/dL (ref 0.0–1.0)
pH: 7 (ref 5.0–8.0)

## 2015-04-01 LAB — TROPONIN I
TROPONIN I: 0.04 ng/mL — AB (ref <0.031)
TROPONIN I: 0.05 ng/mL — AB (ref <0.031)
Troponin I: 0.05 ng/mL — ABNORMAL HIGH (ref <0.031)

## 2015-04-01 LAB — CBC WITH DIFFERENTIAL/PLATELET
BASOS PCT: 1 % (ref 0–1)
Basophils Absolute: 0 10*3/uL (ref 0.0–0.1)
EOS ABS: 0.2 10*3/uL (ref 0.0–0.7)
EOS PCT: 4 % (ref 0–5)
HEMATOCRIT: 32.7 % — AB (ref 39.0–52.0)
HEMOGLOBIN: 11.5 g/dL — AB (ref 13.0–17.0)
LYMPHS ABS: 1.1 10*3/uL (ref 0.7–4.0)
Lymphocytes Relative: 25 % (ref 12–46)
MCH: 30.4 pg (ref 26.0–34.0)
MCHC: 35.2 g/dL (ref 30.0–36.0)
MCV: 86.5 fL (ref 78.0–100.0)
MONO ABS: 0.5 10*3/uL (ref 0.1–1.0)
MONOS PCT: 12 % (ref 3–12)
NEUTROS PCT: 58 % (ref 43–77)
Neutro Abs: 2.6 10*3/uL (ref 1.7–7.7)
Platelets: 145 10*3/uL — ABNORMAL LOW (ref 150–400)
RBC: 3.78 MIL/uL — ABNORMAL LOW (ref 4.22–5.81)
RDW: 12.4 % (ref 11.5–15.5)
WBC: 4.4 10*3/uL (ref 4.0–10.5)

## 2015-04-01 LAB — LIPASE, BLOOD: LIPASE: 51 U/L (ref 22–51)

## 2015-04-01 LAB — LACTATE DEHYDROGENASE: LDH: 441 U/L — ABNORMAL HIGH (ref 98–192)

## 2015-04-01 LAB — URINE MICROSCOPIC-ADD ON

## 2015-04-01 LAB — BRAIN NATRIURETIC PEPTIDE: B Natriuretic Peptide: 447 pg/mL — ABNORMAL HIGH (ref 0.0–100.0)

## 2015-04-01 LAB — MRSA PCR SCREENING: MRSA by PCR: NEGATIVE

## 2015-04-01 LAB — CK: Total CK: 189 U/L (ref 49–397)

## 2015-04-01 MED ORDER — ONDANSETRON HCL 4 MG/2ML IJ SOLN
4.0000 mg | Freq: Once | INTRAMUSCULAR | Status: AC
  Administered 2015-04-01: 4 mg via INTRAVENOUS
  Filled 2015-04-01: qty 2

## 2015-04-01 MED ORDER — ONDANSETRON HCL 4 MG/2ML IJ SOLN
4.0000 mg | Freq: Four times a day (QID) | INTRAMUSCULAR | Status: DC | PRN
Start: 2015-04-01 — End: 2015-04-05
  Administered 2015-04-01 – 2015-04-05 (×7): 4 mg via INTRAVENOUS
  Filled 2015-04-01 (×8): qty 2

## 2015-04-01 MED ORDER — SENNA 8.6 MG PO TABS
1.0000 | ORAL_TABLET | Freq: Two times a day (BID) | ORAL | Status: DC
  Administered 2015-04-01 – 2015-04-07 (×12): 8.6 mg via ORAL
  Filled 2015-04-01 (×13): qty 1

## 2015-04-01 MED ORDER — TRAZODONE HCL 50 MG PO TABS
25.0000 mg | ORAL_TABLET | Freq: Every evening | ORAL | Status: DC | PRN
  Administered 2015-04-01: 25 mg via ORAL
  Filled 2015-04-01: qty 1

## 2015-04-01 MED ORDER — LABETALOL HCL 5 MG/ML IV SOLN
INTRAVENOUS | Status: AC
  Filled 2015-04-01: qty 20

## 2015-04-01 MED ORDER — ENOXAPARIN SODIUM 30 MG/0.3ML ~~LOC~~ SOLN
30.0000 mg | Freq: Every day | SUBCUTANEOUS | Status: DC
  Administered 2015-04-01 – 2015-04-07 (×7): 30 mg via SUBCUTANEOUS
  Filled 2015-04-01 (×7): qty 0.3

## 2015-04-01 MED ORDER — DEXTROSE 5 % IV SOLN
0.5000 mg/min | INTRAVENOUS | Status: DC
  Administered 2015-04-01: 1.5 mg/min via INTRAVENOUS
  Administered 2015-04-01: 1 mg/min via INTRAVENOUS
  Administered 2015-04-02: 1.5 mg/min via INTRAVENOUS
  Filled 2015-04-01: qty 100

## 2015-04-01 MED ORDER — SODIUM CHLORIDE 0.9 % IJ SOLN
3.0000 mL | Freq: Two times a day (BID) | INTRAMUSCULAR | Status: DC
  Administered 2015-04-01 – 2015-04-07 (×6): 3 mL via INTRAVENOUS

## 2015-04-01 MED ORDER — SODIUM CHLORIDE 0.9 % IV SOLN: 0.1000 ug/kg/min | INTRAVENOUS | Status: DC

## 2015-04-01 MED ORDER — ACETAMINOPHEN 325 MG PO TABS
650.0000 mg | ORAL_TABLET | Freq: Four times a day (QID) | ORAL | Status: DC | PRN
  Administered 2015-04-03 – 2015-04-05 (×3): 650 mg via ORAL
  Filled 2015-04-01 (×4): qty 2

## 2015-04-01 MED ORDER — HYDRALAZINE HCL 20 MG/ML IJ SOLN
20.0000 mg | Freq: Once | INTRAMUSCULAR | Status: AC
  Administered 2015-04-01: 20 mg via INTRAVENOUS
  Filled 2015-04-01: qty 1

## 2015-04-01 MED ORDER — ONDANSETRON HCL 4 MG PO TABS: 4.0000 mg | ORAL_TABLET | Freq: Four times a day (QID) | ORAL | Status: DC | PRN

## 2015-04-01 MED ORDER — MORPHINE SULFATE 2 MG/ML IJ SOLN
2.0000 mg | INTRAMUSCULAR | Status: DC | PRN
  Administered 2015-04-01 – 2015-04-05 (×8): 2 mg via INTRAVENOUS
  Filled 2015-04-01 (×9): qty 1

## 2015-04-01 MED ORDER — ALUM & MAG HYDROXIDE-SIMETH 200-200-20 MG/5ML PO SUSP: 30.0000 mL | Freq: Four times a day (QID) | ORAL | Status: DC | PRN

## 2015-04-01 MED ORDER — LABETALOL HCL 200 MG PO TABS: 100.0000 mg | ORAL_TABLET | Freq: Three times a day (TID) | ORAL | Status: DC

## 2015-04-01 MED ORDER — MORPHINE SULFATE 2 MG/ML IJ SOLN
1.0000 mg | INTRAMUSCULAR | Status: DC | PRN
  Administered 2015-04-01: 1 mg via INTRAVENOUS
  Filled 2015-04-01: qty 1

## 2015-04-01 MED ORDER — SORBITOL 70 % SOLN
30.0000 mL | Freq: Every day | Status: DC | PRN
Start: 2015-04-01 — End: 2015-04-07

## 2015-04-01 MED ORDER — HYDRALAZINE HCL 20 MG/ML IJ SOLN
5.0000 mg | INTRAMUSCULAR | Status: DC | PRN
  Administered 2015-04-01 – 2015-04-03 (×3): 5 mg via INTRAVENOUS
  Filled 2015-04-01 (×3): qty 1

## 2015-04-01 MED ORDER — HYDROCODONE-ACETAMINOPHEN 5-325 MG PO TABS
1.0000 | ORAL_TABLET | ORAL | Status: DC | PRN
  Administered 2015-04-04 – 2015-04-05 (×3): 2 via ORAL
  Filled 2015-04-01 (×3): qty 2

## 2015-04-01 MED ORDER — ENOXAPARIN SODIUM 40 MG/0.4ML ~~LOC~~ SOLN: 40.0000 mg | Freq: Every day | SUBCUTANEOUS | Status: DC

## 2015-04-01 MED ORDER — ACETAMINOPHEN 650 MG RE SUPP
650.0000 mg | Freq: Four times a day (QID) | RECTAL | Status: DC | PRN
Start: 2015-04-01 — End: 2015-04-07

## 2015-04-01 MED ORDER — SODIUM CHLORIDE 0.9 % IV BOLUS (SEPSIS)
1000.0000 mL | Freq: Once | INTRAVENOUS | Status: AC
  Administered 2015-04-01: 1000 mL via INTRAVENOUS

## 2015-04-01 NOTE — Progress Notes (Signed)
eLink Physician-Brief Progress Note Patient Name: ZEKIEL FERRELLI DOB: 1980-10-20 MRN: [id number]   Date of Service  04/01/2015  HPI/Events of Note  34 yo male with PMH of HTN, Chronic Kidney Disease and non compliance. Admitted to ICU with Hypertensive Crisis. Creatinine = 6.83. Nephrology has been consulted. Current medical regimen includes a Labetalol IV infusion. BP = 170/94 and HR = 84. O2 sat = 97% and RR = 16.  eICU Interventions  Continue current management.      Intervention Category Evaluation Type: New Patient Evaluation  Lysle Dingwall 04/01/2015, 6:35 PM

## 2015-04-01 NOTE — Progress Notes (Signed)
Transferred patient to ICU. Danae Chen, RN received report. Patient was alert and oriented x4. Patients BP remained elevated and patient was started on a labetalol drip per MD order.

## 2015-04-01 NOTE — Consult Note (Addendum)
Terrance Reynolds MRN: [id number] DOB/AGE: [birth year] 36 y.o. Primary Care Physician:No PCP Per Patient Admit date: 04/01/2015 Chief Complaint:  Chief Complaint  Patient presents with  . Headache  . Hypertension   HPI: Pt  is a 34 y.o. male with past medical hx of  HTN who presented  to ER with chief complaint of headache .  HPI dates back to past 2 3-3 days when pt started having headaches. Pt also c/o of and nausea and vomiting.   Upon evaluation in ER pt was found to have blood pressure if 184/134   Pt was admitted with hypertensive crisis, worsening renal failure, and  elevated troponin . Pt also c/o decreased intake.  Pt gave hx that  he has not had his blood pressure medicine for about a month.  Pt does not have a PCP and says  he gets his any hypertensive medication from the emergency department. Pt has had 2 visit in this year of  2016 . Pt offers no c/o chest pain  no c/o dyspnea NO c/o diarrhera NO c/o coffee ground emesis No c/o fever/ chills/ cough.  NO c/o change in speech/vision. NO c/o  abdominal pain / constipation.  Past Medical History  Diagnosis Date  . Psoriasis   . Hypertension       History reviewed. No pertinent family history. NO hx of ESRD  Social History:  reports that he has been smoking.  He does not have any smokeless tobacco history on file. He reports that he does not drink alcohol or use illicit drugs.   Allergies:  Allergies  Allergen Reactions  . Penicillins Other (See Comments)    Child hood allergy    Medications Prior to Admission  Medication Sig Dispense Refill  . aspirin 325 MG tablet Take 325-650 mg by mouth daily as needed for headache.    . Multiple Vitamin (MULTIVITAMIN WITH MINERALS) TABS tablet Take 1 tablet by mouth daily.         [id number] from the symptoms mentioned above,there are no other symptoms referable to all systems reviewed.  . enoxaparin (LOVENOX) injection  30 mg Subcutaneous Daily  . labetalol   100 mg Oral TID  . senna  1 tablet Oral BID  . sodium chloride  3 mL Intravenous Q12H     Physical Exam: Vital signs in last 24 hours: Temp:  [98.1 F (36.7 C)-98.2 F (36.8 C)] 98.2 F (36.8 C) (06/14 1229) Pulse Rate:  [65-81] 81 (06/14 1229) Resp:  [10-18] 18 (06/14 1229) BP: (148-202)/(105-138) 177/116 mmHg (06/14 1326) SpO2:  [98 %-100 %] 100 % (06/14 1229) Weight:  [190 lb (86.183 kg)] 190 lb (86.183 kg) (06/14 0814) Weight change:  Last BM Date: 04/01/15  Intake/Output from previous day:   Total I/O In: 10 [I.V.:10] Out: -    Physical Exam: General- pt is awake,alert, oriented to time place and person Resp- No acute REsp distress, CTA B/L NO Rhonchi CVS- S1S2 regular in rate and rhythm GIT- BS+, soft, NT, ND EXT- trace lE Edema, Cyanosis CNS- CN 2-12 grossly intact. Moving all 4 extremities Psych- normal mood and affect    Lab Results: CBC  Recent Labs  04/01/15 0847  WBC 4.4  HGB 11.5*  HCT 32.7*  PLT 145*   Hgb 15=>11.5 plts 249=>145  BMET  Recent Labs  04/01/15 0847  NA 137  K 3.9  CL 95*  CO2 26  GLUCOSE 96  BUN 58*  CREATININE 6.83*  CALCIUM 8.2*   Creat  2.42=>6.83  MICRO No results found for this or any previous visit (from the past 240 hour(s)).    Lab Results  Component Value Date   CALCIUM 8.2* 04/01/2015      Impression: 1)Renal  AKI sec to HTN emergency                  Data in favor                     Rise in creat                     Hematuria                     High BP                                   CKD stage 3 .               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 Secondary Hyperparathyroidism w/u pending. Phosphorus & Vitamin 25-OH will check.   5)Substance abuse- Pt gave hx of using illegal drugs Primary team has  checked urine tox screen Primary MD following  6)Electrolytes Normokalemic NOrmonatremic  7)Acid base Co2 at goal     Plan:  Renal  Will ask for CPK Will initiate CKD work up Will initiate Proteinuria work up wwil initiate autoimmune work up  HTN Will ask for plasma metanephrine's Will ask for aldosterone, renin and 24hr urine aldosterone   Hemolytic Anemia Will ask for Haptoglobin, LDH   Educated pt at length about his GFR and need of renal replacement therapy if GFr does not improve Case d/w primary team.    Liana Gerold 04/01/2015, 1:45 PM

## 2015-04-01 NOTE — H&P (Signed)
[facility] History and Physical  Terrance Reynolds [id number] DOB: 1981-03-26 DOA: 04/01/2015  Referring physician: Betsey Holiday PCP: No PCP Per Patient   Chief Complaint:   HPI: Terrance Reynolds is a 34 y.o. male with past medical hx includes HTN, CKD and non-compliance presents to ED with chief complaint of headache and nausea and vomiting. Initial evaluation revealed hypertensive crisis, worsening renal failure, elevated troponin and abnormal EKG. Patient states 2 day history of nausea vomiting and decreased by mouth intake. He reports he has not had his blood pressure medicine for about a month. He does not have a PCP and claims he gets his any hypertensive medication from the emergency department. Patient works outside and reports he thought he was possibly dehydrated but felt no better after drinking water. He had 2 episodes of emesis. Reports no coffee ground emesis. Denies chest pain palpitation but does endorse mild shortness of breath since he came to the emergency department. He denies fever chills cough visual disturbances numbness tingling of extremities. He denies abdominal pain diarrhea constipation.  Workup in the emergency department reveals hemoglobin of 11.5, platelets of 145 chloride 95 BUN 58 creatinine 6.83 calcium 8.2, troponin 0.04. EKG RSR' in V1 or V2, probably normal variant LVH with secondary repolarization abnormality Borderline prolonged QT interval Baseline wander in lead(s) V3 V4. Urinalysis moderate hemoglobin.  In the emergency department blood pressure is 184/134 with a heart rate of 86 he's afebrile not hypoxic. He is given IV fluids and 1 dose of hydralazine as well as Zofran.   Review of Systems:  10 point review of systems complete and all systems are negative except as indicated in the history of present illness  Past Medical History  Diagnosis Date  . Psoriasis   . Hypertension    History reviewed. No pertinent past surgical history. Social  History:  reports that he has been smoking.  He does not have any smokeless tobacco history on file. He reports that he does not drink alcohol or use illicit drugs.  Allergies  Allergen Reactions  . Penicillins Other (See Comments)    Child hood allergy    No family history on file. reports father is deceased at age 3 due to a stroke. He has siblings that he does not know. His biological mother was not known to him either  Prior to Admission medications   Medication Sig Start Date End Date Taking? Authorizing Provider  aspirin 325 MG tablet Take 325-650 mg by mouth daily as needed for headache.   Yes Historical Provider, MD  Multiple Vitamin (MULTIVITAMIN WITH MINERALS) TABS tablet Take 1 tablet by mouth daily.   Yes Historical Provider, MD   Physical Exam: Filed Vitals:   04/01/15 0930 04/01/15 1000 04/01/15 1015 04/01/15 1030  BP: 159/112 160/107  184/134  Pulse: 67 72 73   Temp:      TempSrc:      Resp: 13 16 13 13   Height:      Weight:      SpO2: 99% 100% 100%     Wt Readings from Last 3 Encounters:  04/01/15 86.183 kg (190 lb)  03/02/15 86.183 kg (190 lb)  12/25/14 83.915 kg (185 lb)    General:  Appears calm and comfortable Eyes: PERRL, normal lids, irises & conjunctiva ENT: grossly normal hearing, lips & tongue Neck: no LAD, masses or thyromegaly Cardiovascular: RRR, no m/r/g. Trace to 1+ lower extremity edema Respiratory: CTA bilaterally, no w/r/r. Normal respiratory effort. Abdomen: soft, ntnd positive bowel  sounds throughout Skin: no rash or induration seen on limited exam Musculoskeletal: grossly normal tone BUE/BLE Psychiatric: grossly normal mood and affect, speech fluent and appropriate Neurologic: grossly non-focal. Speech clear facial symmetry           Labs on Admission:  Basic Metabolic Panel:  Recent Labs Lab 04/01/15 0847  NA 137  K 3.9  CL 95*  CO2 26  GLUCOSE 96  BUN 58*  CREATININE 6.83*  CALCIUM 8.2*   Liver Function  Tests:  Recent Labs Lab 04/01/15 0847  AST 35  ALT 46  ALKPHOS 92  BILITOT 0.6  PROT 6.7  ALBUMIN 3.4*    Recent Labs Lab 04/01/15 0847  LIPASE 51   No results for input(s): AMMONIA in the last 168 hours. CBC:  Recent Labs Lab 04/01/15 0847  WBC 4.4  NEUTROABS 2.6  HGB 11.5*  HCT 32.7*  MCV 86.5  PLT 145*   Cardiac Enzymes:  Recent Labs Lab 04/01/15 0847  TROPONINI 0.04*    BNP (last 3 results) No results for input(s): BNP in the last 8760 hours.  ProBNP (last 3 results) No results for input(s): PROBNP in the last 8760 hours.  CBG: No results for input(s): GLUCAP in the last 168 hours.  Radiological Exams on Admission: No results found.  EKG: Independently reviewed as noted above  Assessment/Plan Principal Problem:   Hypertensive crisis: Likely related to noncompliance. Will admit to telemetry. Will provide labetalol. Likely need more than one agent. Active Problems:     Acute on chronic renal failure; concern for end organ damage due to #1. He reports urine output good. Will obtain a renal ultrasound. Request nephrology consult. Will defer fluids/diarrhetic's to nephrology.    Elevated troponin: Denies chest pain. Likely related to above. We'll cycle troponins. Check a lipid panel monitor on telemetry. Will obtain a 2-D echo as patient exhibits lower extremity edema and complains of intermittent shortness of breath.   Leg edema: EKG with LVH. See above.    Dyspnea: We'll obtain a chest x-ray. Breath sounds good on exam. He is not hypoxic. Will monitor.    Code Status: full DVT Prophylaxis: Family Communication: none present Disposition Plan: home when ready  Time spent: 74 minutes  [facility] Pager [phone]

## 2015-04-01 NOTE — ED Notes (Signed)
EDP at bedside  

## 2015-04-01 NOTE — ED Provider Notes (Signed)
CSN: [id number]     Arrival date & time 04/01/15  0808 History  This chart was scribed for Orpah Greek, MD by Rayna Sexton, ED scribe. This patient was seen in room APA12/APA12 and the patient's care was started at 8:20 AM.    Chief Complaint  Patient presents with  . Headache  . Hypertension    The history is provided by the patient. No language interpreter was used.    HPI Comments: Terrance Reynolds is a 34 y.o. male who presents to the Emergency Department complaining of nausea and vomiting with onset 2 days ago. Pt notes HA as an associated symptom and drank Pedialyte due to possible dehydration but notes "throwing it back up" and experiencing no relief of symptoms. He additionally notes chronic hypertension and currently takes 50 mg of Metoprolol daily. He denies diarrhea, fever, cough, SOB and blurred vision.   Past Medical History  Diagnosis Date  . Psoriasis   . Hypertension    History reviewed. No pertinent past surgical history. No family history on file. History  Substance Use Topics  . Smoking status: Current Every Day Smoker  . Smokeless tobacco: Not on file  . Alcohol Use: No    Review of Systems  Constitutional: Negative for fever and chills.  Eyes: Negative for photophobia and visual disturbance.  Respiratory: Negative for cough.   Gastrointestinal: Positive for nausea and vomiting. Negative for diarrhea.  Neurological: Positive for headaches.  All other systems reviewed and are negative.     Allergies  Penicillins  Home Medications   Prior to Admission medications   Medication Sig Start Date End Date Taking? Authorizing Provider  aspirin 325 MG tablet Take 325-650 mg by mouth daily as needed for headache.   Yes Historical Provider, MD  Multiple Vitamin (MULTIVITAMIN WITH MINERALS) TABS tablet Take 1 tablet by mouth daily.   Yes Historical Provider, MD   BP 174/122 mmHg  Pulse 69  Temp(Src) 98.1 F (36.7 C) (Oral)  Resp 13  Ht 6\' 2"   (1.88 m)  Wt 190 lb (86.183 kg)  BMI 24.38 kg/m2  SpO2 98% Physical Exam  Constitutional: He is oriented to person, place, and time. He appears well-developed and well-nourished. No distress.  HENT:  Head: Normocephalic and atraumatic.  Right Ear: Hearing normal.  Left Ear: Hearing normal.  Nose: Nose normal.  Mouth/Throat: Oropharynx is clear and moist and mucous membranes are normal.  Eyes: Conjunctivae and EOM are normal. Pupils are equal, round, and reactive to light.  Neck: Normal range of motion. Neck supple.  Cardiovascular: Regular rhythm, S1 normal and S2 normal.  Exam reveals no gallop and no friction rub.   No murmur heard. Pulmonary/Chest: Effort normal and breath sounds normal. No respiratory distress. He exhibits no tenderness.  Abdominal: Soft. Normal appearance and bowel sounds are normal. There is no hepatosplenomegaly. There is no tenderness. There is no rebound, no guarding, no tenderness at McBurney's point and negative Murphy's sign. No hernia.  Musculoskeletal: Normal range of motion.  Neurological: He is alert and oriented to person, place, and time. He has normal strength. No cranial nerve deficit or sensory deficit. Coordination normal. GCS eye subscore is 4. GCS verbal subscore is 5. GCS motor subscore is 6.  Skin: Skin is warm, dry and intact. No rash noted. No cyanosis.  Psychiatric: He has a normal mood and affect. His speech is normal and behavior is normal. Thought content normal.  Nursing note and vitals reviewed.   ED Course  Procedures  DIAGNOSTIC STUDIES: Oxygen Saturation is 100% on RA, normal by my interpretation.    COORDINATION OF CARE: 8:22 AM Discussed treatment plan with pt at bedside and pt agreed to plan.  Labs Review Labs Reviewed  CBC WITH DIFFERENTIAL/PLATELET - Abnormal; Notable for the following:    RBC 3.78 (*)    Hemoglobin 11.5 (*)    HCT 32.7 (*)    Platelets 145 (*)    All other components within normal limits   COMPREHENSIVE METABOLIC PANEL - Abnormal; Notable for the following:    Chloride 95 (*)    BUN 58 (*)    Creatinine, Ser 6.83 (*)    Calcium 8.2 (*)    Albumin 3.4 (*)    GFR calc non Af Amer 9 (*)    GFR calc Af Amer 11 (*)    Anion gap 16 (*)    All other components within normal limits  TROPONIN I - Abnormal; Notable for the following:    Troponin I 0.04 (*)    All other components within normal limits  LIPASE, BLOOD  URINALYSIS, ROUTINE W REFLEX MICROSCOPIC (NOT AT [facility])    Imaging Review No results found.   EKG Interpretation   Date/Time:  Tuesday April 01 2015 08:18:18 EDT Ventricular Rate:  72 PR Interval:  154 QRS Duration: 101 QT Interval:  445 QTC Calculation: 487 R Axis:   -42 Text Interpretation:  Sinus rhythm RSR' in V1 or V2, probably normal  variant LVH with secondary repolarization abnormality Borderline prolonged  QT interval Baseline wander in lead(s) V3 V4 Confirmed by Salvator Seppala  MD,  Oshay Stranahan [id number]) on 04/01/2015 8:24:55 AM      MDM   Final diagnoses:  None   uncontrolled hypertension  Acute on chronic renal failure  Patient presents to the ER for evaluation of elevated blood pressure and vomiting. Patient reports that he has been experiencing nausea and vomiting, has not been able to hold on his blood pressure medications. He does report that he has a history of high blood pressure. He is not sure what medication he takes for his blood pressure. Patient reports that he has been working outside in the heat the last couple of days and thinks he is dehydrated. Reviewing his records reveals history of hypertension and renal insufficiency, baseline creatinine around 2. Patient is not experiencing chest pain or shortness of breath.  EKG performed at arrival to show LVH with repolarization abnormality. Borderline T-wave inversions and ST depressions laterally, could be ischemia versus LVH. This was not seen on patient's last EKG. Patient's troponin is  borderline elevated at 0.04. Again, he is not experiencing chest pain or shortness of breath currently.  Laboratory reveals marked worsening of his renal insufficiency. Creatinine is 6.83 today. This is likely multifactorial. Patient has a history of chronic renal failure that is felt to be secondary to his hypertension. He does have a history of ibuprofen use, but he reports that he has not been using any ibuprofen or NSAIDs, only takes aspirin for pain medication. He has been working in the heat and likely has an element of dehydration. Patient was given a liter of normal saline solution here in the ER. He also was markedly hypertensive here in the ER. Elevated creatinine could be secondary to end organ damage from hypertensive crisis. Patient administered hydralazine here in the ER with some improvement of his blood pressure.  Patient will require hospitalization for further management of hypertension, cycling of cardiac enzymes and nephrology consultation.  Case was discussed with Dr. Theador Hawthorne, on call for nephrology, who will perform consultation on patient.  CRITICAL CARE Performed by: Orpah Greek   Total critical care time: 66min  Critical care time was exclusive of separately billable procedures and treating other patients.  Critical care was necessary to treat or prevent imminent or life-threatening deterioration.  Critical care was time spent personally by me on the following activities: development of treatment plan with patient and/or surrogate as well as nursing, discussions with consultants, evaluation of patient's response to treatment, examination of patient, obtaining history from patient or surrogate, ordering and performing treatments and interventions, ordering and review of laboratory studies, ordering and review of radiographic studies, pulse oximetry and re-evaluation of patient's condition.   I personally performed the services described in this documentation,  which was scribed in my presence. The recorded information has been reviewed and is accurate.    Orpah Greek, MD 04/01/15 1013

## 2015-04-01 NOTE — ED Notes (Signed)
Pt reports vomiting x 2 days and unable to keep his medications down.  Reports bp this morning was 200's/193 and c/o headache.

## 2015-04-02 ENCOUNTER — Inpatient Hospital Stay (HOSPITAL_COMMUNITY): Payer: Medicaid Other

## 2015-04-02 DIAGNOSIS — N189 Chronic kidney disease, unspecified: Secondary | ICD-10-CM

## 2015-04-02 DIAGNOSIS — F1910 Other psychoactive substance abuse, uncomplicated: Secondary | ICD-10-CM

## 2015-04-02 DIAGNOSIS — Z720 Tobacco use: Secondary | ICD-10-CM

## 2015-04-02 DIAGNOSIS — E611 Iron deficiency: Secondary | ICD-10-CM

## 2015-04-02 DIAGNOSIS — B1920 Unspecified viral hepatitis C without hepatic coma: Secondary | ICD-10-CM

## 2015-04-02 DIAGNOSIS — D649 Anemia, unspecified: Secondary | ICD-10-CM

## 2015-04-02 DIAGNOSIS — D631 Anemia in chronic kidney disease: Secondary | ICD-10-CM

## 2015-04-02 DIAGNOSIS — D594 Other nonautoimmune hemolytic anemias: Secondary | ICD-10-CM

## 2015-04-02 DIAGNOSIS — N179 Acute kidney failure, unspecified: Principal | ICD-10-CM

## 2015-04-02 DIAGNOSIS — D890 Polyclonal hypergammaglobulinemia: Secondary | ICD-10-CM

## 2015-04-02 DIAGNOSIS — K769 Liver disease, unspecified: Secondary | ICD-10-CM

## 2015-04-02 DIAGNOSIS — R740 Nonspecific elevation of levels of transaminase and lactic acid dehydrogenase [LDH]: Secondary | ICD-10-CM

## 2015-04-02 DIAGNOSIS — Z8619 Personal history of other infectious and parasitic diseases: Secondary | ICD-10-CM

## 2015-04-02 DIAGNOSIS — K759 Inflammatory liver disease, unspecified: Secondary | ICD-10-CM

## 2015-04-02 DIAGNOSIS — I10 Essential (primary) hypertension: Secondary | ICD-10-CM

## 2015-04-02 LAB — C4 COMPLEMENT: Complement C4, Body Fluid: 25 mg/dL (ref 14–44)

## 2015-04-02 LAB — ANTINUCLEAR ANTIBODIES, IFA: ANA Ab, IFA: NEGATIVE

## 2015-04-02 LAB — CBC
HCT: 31.2 % — ABNORMAL LOW (ref 39.0–52.0)
Hemoglobin: 10.9 g/dL — ABNORMAL LOW (ref 13.0–17.0)
MCH: 30.4 pg (ref 26.0–34.0)
MCHC: 34.9 g/dL (ref 30.0–36.0)
MCV: 87.2 fL (ref 78.0–100.0)
PLATELETS: 147 10*3/uL — AB (ref 150–400)
RBC: 3.58 MIL/uL — AB (ref 4.22–5.81)
RDW: 12.5 % (ref 11.5–15.5)
WBC: 5.4 10*3/uL (ref 4.0–10.5)

## 2015-04-02 LAB — VITAMIN B12: VITAMIN B 12: 765 pg/mL (ref 180–914)

## 2015-04-02 LAB — PROTEIN ELECTROPHORESIS, SERUM
A/G Ratio: 1 (ref 0.7–1.7)
Albumin ELP: 3.4 g/dL (ref 2.9–4.4)
Alpha-1-Globulin: 0.3 g/dL (ref 0.0–0.4)
Alpha-2-Globulin: 0.6 g/dL (ref 0.4–1.0)
Beta Globulin: 1.3 g/dL (ref 0.7–1.3)
Gamma Globulin: 1.1 g/dL (ref 0.4–1.8)
Globulin, Total: 3.3 g/dL (ref 2.2–3.9)
Total Protein ELP: 6.7 g/dL (ref 6.0–8.5)

## 2015-04-02 LAB — ANCA TITERS
Atypical P-ANCA titer: <1:20 {titer}
C-ANCA: <1:20 {titer}
P-ANCA: <1:20 {titer}

## 2015-04-02 LAB — SAVE SMEAR

## 2015-04-02 LAB — IMMUNOFIXATION ELECTROPHORESIS
IgA: 206 mg/dL (ref 90–386)
IgG (Immunoglobin G), Serum: 678 mg/dL — ABNORMAL LOW (ref 700–1600)
IgM, Serum: 575 mg/dL — ABNORMAL HIGH (ref 20–172)
Total Protein ELP: 6.9 g/dL (ref 6.0–8.5)

## 2015-04-02 LAB — VITAMIN D 25 HYDROXY (VIT D DEFICIENCY, FRACTURES): Vit D, 25-Hydroxy: 45.8 ng/mL (ref 30.0–100.0)

## 2015-04-02 LAB — COMPREHENSIVE METABOLIC PANEL
ALT: 35 U/L (ref 17–63)
ANION GAP: 14 (ref 5–15)
AST: 24 U/L (ref 15–41)
Albumin: 3 g/dL — ABNORMAL LOW (ref 3.5–5.0)
Alkaline Phosphatase: 75 U/L (ref 38–126)
BUN: 67 mg/dL — AB (ref 6–20)
CALCIUM: 8 mg/dL — AB (ref 8.9–10.3)
CO2: 26 mmol/L (ref 22–32)
CREATININE: 7.21 mg/dL — AB (ref 0.61–1.24)
Chloride: 98 mmol/L — ABNORMAL LOW (ref 101–111)
GFR calc Af Amer: 10 mL/min — ABNORMAL LOW (ref >60)
GFR calc non Af Amer: 9 mL/min — ABNORMAL LOW (ref >60)
Glucose, Bld: 100 mg/dL — ABNORMAL HIGH (ref 65–99)
Potassium: 3.6 mmol/L (ref 3.5–5.1)
SODIUM: 138 mmol/L (ref 135–145)
Total Bilirubin: 0.7 mg/dL (ref 0.3–1.2)
Total Protein: 5.9 g/dL — ABNORMAL LOW (ref 6.5–8.1)

## 2015-04-02 LAB — RETICULOCYTES
RBC.: 3.56 MIL/uL — ABNORMAL LOW (ref 4.22–5.81)
Retic Count, Absolute: 39.2 10*3/uL (ref 19.0–186.0)
Retic Ct Pct: 1.1 % (ref 0.4–3.1)

## 2015-04-02 LAB — HEPATITIS PANEL, ACUTE
HCV Ab: >11 s/co ratio — ABNORMAL HIGH (ref 0.0–0.9)
Hep A IgM: NEGATIVE
Hep B C IgM: NEGATIVE
Hepatitis B Surface Ag: NEGATIVE

## 2015-04-02 LAB — LACTATE DEHYDROGENASE: LDH: 349 U/L — AB (ref 98–192)

## 2015-04-02 LAB — DIRECT ANTIGLOBULIN TEST (NOT AT ARMC)
DAT, IgG: NEGATIVE
DAT, complement: NEGATIVE

## 2015-04-02 LAB — HAPTOGLOBIN: Haptoglobin: <10 mg/dL — ABNORMAL LOW (ref 34–200)

## 2015-04-02 LAB — CREATININE, URINE, RANDOM: Creatinine, Urine: 83.02 mg/dL

## 2015-04-02 LAB — C3 COMPLEMENT: C3 Complement: 114 mg/dL (ref 82–167)

## 2015-04-02 LAB — ANTI-DNA ANTIBODY, DOUBLE-STRANDED: ds DNA Ab: 11 IU/mL — ABNORMAL HIGH (ref 0–9)

## 2015-04-02 LAB — FOLATE: Folate: 11.9 ng/mL (ref >5.9)

## 2015-04-02 LAB — PHOSPHORUS: Phosphorus: 8 mg/dL — ABNORMAL HIGH (ref 2.5–4.6)

## 2015-04-02 LAB — MPO/PR-3 (ANCA) ANTIBODIES
ANCA Proteinase 3: <3.5 U/mL (ref 0.0–3.5)
Myeloperoxidase Abs: <9 U/mL (ref 0.0–9.0)

## 2015-04-02 LAB — SODIUM, URINE, RANDOM: Sodium, Ur: 59 mmol/L

## 2015-04-02 LAB — IRON AND TIBC
Iron: 58 ug/dL (ref 45–182)
SATURATION RATIOS: 16 % — AB (ref 17.9–39.5)
TIBC: 361 ug/dL (ref 250–450)
UIBC: 303 ug/dL

## 2015-04-02 LAB — COMPLEMENT, TOTAL: Compl, Total (CH50): >60 U/mL (ref 42–60)

## 2015-04-02 LAB — FERRITIN: FERRITIN: 87 ng/mL (ref 24–336)

## 2015-04-02 LAB — GLOMERULAR BASEMENT MEMBRANE ANTIBODIES: GBM Ab: 3 units (ref 0–20)

## 2015-04-02 MED ORDER — CALCIUM ACETATE (PHOS BINDER) 667 MG PO CAPS
667.0000 mg | ORAL_CAPSULE | Freq: Three times a day (TID) | ORAL | Status: DC
  Administered 2015-04-02 – 2015-04-07 (×13): 667 mg via ORAL
  Filled 2015-04-02 (×14): qty 1

## 2015-04-02 MED ORDER — LABETALOL HCL 200 MG PO TABS
200.0000 mg | ORAL_TABLET | Freq: Two times a day (BID) | ORAL | Status: DC
  Administered 2015-04-02 (×2): 200 mg via ORAL
  Filled 2015-04-02 (×2): qty 1

## 2015-04-02 MED ORDER — METHADONE HCL 10 MG PO TABS
75.0000 mg | ORAL_TABLET | Freq: Every day | ORAL | Status: DC
  Administered 2015-04-02 – 2015-04-07 (×6): 75 mg via ORAL
  Filled 2015-04-02 (×6): qty 8

## 2015-04-02 NOTE — Progress Notes (Signed)
Pt BP was 161/101 and I gave the scheduled 200 mg of labetolol. After rechecking pt's BP, it is now 164/102. Dr.Kirby made aware. Will continue to monitor.

## 2015-04-02 NOTE — Consult Note (Signed)
Referring Provider: No ref. provider found Primary Care Physician:  No PCP Per Patient Primary Gastroenterologist:  Dr. Laural Golden  Reason for Consultation:  Positive or reactive HCV antibody.  HPI:   Patient is 34 year old Caucasian male who presented to emergency room yesterday morning with headache, nausea and vomiting. He has history of hypertension and kidney disease and has been noncompliant with his medications. His blood pressure admission was 174/122. He was also noted to have serum creatinine of 6.83. Patient was admitted to ICU for management of his hypertension. Patient has been evaluated by Dr. Hinda Lenis ordered multiple tests looking for etiology of his acute on chronic kidney disease. Appetite is seen widest antibody came back positive. Hepatitis B surface antigen, hepatitis A IgM antibody and hepatitis B core IgM antibody were negative. He was also noted to be anemic and mythology consultation has also been requested. Patient denies history of hepatitis. There is no history of elevated transaminases either. He gives history of IV drug use with cocaine few years ago and more recently narcotics. He states he has not used any since he has been on methadone which was one month ago. He does not remember if he has been vaccinated for hepatitis A or B. He denies melena or rectal bleeding diarrhea constipation or chronic abdominal pain. He also denies hematemesis or dysphagia. He has not been taking his antihypertensives. He's been taking aspirin or when necessary basis for headache which is not daily. Patient is single. He is presently living in [facility]. 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 He drinks alcohol occasionally no more than a few drinks a year. He smokes anywhere from half to 1 pack of cigarettes per day. His mother is in nursing home. He has three1/2 brothers and one sister.  living. One sister is deceased and he does not know why.   Past Medical  History  Diagnosis Date  . 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 He is not taking any medications.    . Hypertension   . Substance abuse     methadone clinic  .  chronic kidney disease diagnosed in March 2016    .              History reviewed. No pertinent past surgical history.  Prior to Admission medications   Medication Sig Start Date End Date Taking? Authorizing Provider  aspirin 325 MG tablet Take 325-650 mg by mouth daily as needed for headache.   Yes Historical Provider, MD  Multiple Vitamin (MULTIVITAMIN WITH MINERALS) TABS tablet Take 1 tablet by mouth daily.   Yes Historical Provider, MD    Current Facility-Administered Medications  Medication Dose Route Frequency Provider Last Rate Last Dose  . acetaminophen (TYLENOL) tablet 650 mg  650 mg Oral Q6H PRN Radene Gunning, NP       Or  . acetaminophen (TYLENOL) suppository 650 mg  650 mg Rectal Q6H PRN Radene Gunning, NP      . alum & mag hydroxide-simeth (MAALOX/MYLANTA) 200-200-20 MG/5ML suspension 30 mL  30 mL Oral Q6H PRN Radene Gunning, NP      . calcium acetate (PHOSLO) capsule 667 mg  667 mg Oral TID WC Manpreet Toya Smothers, MD      . enoxaparin (LOVENOX) injection 30 mg  30 mg Subcutaneous Daily Orvan Falconer, MD   30 mg at 04/02/15 0900  . hydrALAZINE (APRESOLINE) injection 5 mg  5 mg Intravenous Q4H PRN Radene Gunning, NP   5 mg at 04/01/15  1326  . HYDROcodone-acetaminophen (NORCO/VICODIN) 5-325 MG per tablet 1-2 tablet  1-2 tablet Oral Q4H PRN Radene Gunning, NP      . labetalol (NORMODYNE) tablet 200 mg  200 mg Oral BID Radene Gunning, NP   200 mg at 04/02/15 0940  . methadone (DOLOPHINE) tablet 75 mg  75 mg Oral Daily Radene Gunning, NP   75 mg at 04/02/15 1002  . morphine 2 MG/ML injection 1 mg  1 mg Intravenous Q3H PRN Radene Gunning, NP   1 mg at 04/01/15 1235  . morphine 2 MG/ML injection 2 mg  2 mg Intravenous Q4H PRN Orvan Falconer, MD   2 mg at 04/02/15 0741  . ondansetron (ZOFRAN) tablet 4 mg  4 mg Oral Q6H PRN Radene Gunning, NP        Or  . ondansetron [facility]) injection 4 mg  4 mg Intravenous Q6H PRN Radene Gunning, NP   4 mg at 04/01/15 1933  . senna (SENOKOT) tablet 8.6 mg  1 tablet Oral BID Lezlie Octave Black, NP   8.6 mg at 04/02/15 0900  . sodium chloride 0.9 % injection 3 mL  3 mL Intravenous Q12H Lezlie Octave Black, NP   3 mL at 04/02/15 0900  . sorbitol 70 % solution 30 mL  30 mL Oral Daily PRN Radene Gunning, NP      . traZODone (DESYREL) tablet 25 mg  25 mg Oral QHS PRN Radene Gunning, NP   25 mg at 04/01/15 2143    Allergies as of 04/01/2015 - Review Complete 04/01/2015  Allergen Reaction Noted  . Penicillins Other (See Comments) 12/25/2014    History reviewed. No pertinent family history.  History   Social History  . Marital Status: Single    Spouse Name: N/A  . Number of Children: N/A  . Years of Education: N/A   Occupational History  . Not on file.   Social History Main Topics  . Smoking status: Current Every Day Smoker  . Smokeless tobacco: Not on file  . Alcohol Use: No  . Drug Use: No     Comment: former IV drug user  . Sexual Activity: Not on file   Other Topics Concern  . Not on file   Social History Narrative    Review of Systems: See HPI, otherwise normal ROS  Physical Exam: Temp:  [97.8 F (36.6 C)-98.7 F (37.1 C)] 97.8 F (36.6 C) (06/15 1528) Pulse Rate:  [66-89] 66 (06/15 1500) Resp:  [9-21] 14 (06/15 1500) BP: (117-183)/(61-103) 139/91 mmHg (06/15 1500) SpO2:  [92 %-99 %] 97 % (06/15 1500) Weight:  [175 lb 4.3 oz (79.5 kg)] 175 lb 4.3 oz (79.5 kg) (06/15 0500) Last BM Date: 04/01/15 Well-developed thin Caucasian male who is alert and in no acute distress. He does not have asterixis. Conjunctiva was pink. Sclerae nonicteric. Oropharyngeal mucosa is unremarkable. Neck masses or thyromegaly noted. He not have gynecomastia or spider angiomata Cardiac exam with regular rhythm normal S1 and S2. No murmur or gallop noted. Abdomen is flat and soft. Spleen is not palpable. Liver  edge is easily palpable below RCM and does not appear to be enlarged. No peripheral edema or clubbing noted.  Intake/Output from previous day: 06/14 0701 - 06/15 0700 In: 274.1 [I.V.:274.1] Out: 800 [Urine:800] Intake/Output this shift:    Lab Results:  Recent Labs  04/01/15 0847 04/02/15 0425  WBC 4.4 5.4  HGB 11.5* 10.9*  HCT 32.7* 31.2*  PLT 145*  147*   BMET  Recent Labs  04/01/15 0847 04/02/15 0425  NA 137 138  K 3.9 3.6  CL 95* 98*  CO2 26 26  GLUCOSE 96 100*  BUN 58* 67*  CREATININE 6.83* 7.21*  CALCIUM 8.2* 8.0*   LFT  Recent Labs  04/02/15 0425  PROT 5.9*  ALBUMIN 3.0*  AST 24  ALT 35  ALKPHOS 75  BILITOT 0.7   PT/INR No results for input(s): LABPROT, INR in the last 72 hours. Hepatitis Panel  Recent Labs  04/01/15 1702  HEPBSAG Negative  HCVAB >11.0*  HEPAIGM Negative  HEPBIGM Negative    Studies/Results: X-ray Chest Pa And Lateral  04/01/2015   CLINICAL DATA:  One day history of shortness of breath  EXAM: CHEST  2 VIEW  COMPARISON:  None.  FINDINGS: There is no edema or consolidation. The heart size and pulmonary vascularity are normal. No adenopathy. No bone lesions.  IMPRESSION: No edema or consolidation.   Electronically Signed   By: Lowella Grip III M.D.   On: 04/01/2015 15:25   Ct Head Wo Contrast  04/01/2015   CLINICAL DATA:  Headache for 2 days with nausea and vomiting and blurred vision.  EXAM: CT HEAD WITHOUT CONTRAST  TECHNIQUE: Contiguous axial images were obtained from the base of the skull through the vertex without intravenous contrast.  COMPARISON:  12/25/2014  FINDINGS: No mass lesion. No midline shift. No acute hemorrhage or hematoma. No extra-axial fluid collections. No evidence of acute infarction. Brain parenchyma is normal. Osseous structures are normal.  IMPRESSION: Normal exam.   Electronically Signed   By: Lorriane Shire M.D.   On: 04/01/2015 13:57   US Renal  04/01/2015   CLINICAL DATA:  Increased creatinine   EXAM: RENAL / URINARY TRACT ULTRASOUND COMPLETE  COMPARISON:  None.  FINDINGS: Right Kidney:  Length: 11 cm.  Echogenic cortex.  No hydronephrosis or mass lesion  Left Kidney:  Length: 11 cm.  Echogenic cortex.  No hydronephrosis or mass lesion  Bladder:  Appears normal for degree of bladder distention. Patent bilateral ureters with jets.  IMPRESSION: 1. Medical renal disease without atrophy. 2. No hydronephrosis.   Electronically Signed   By: Monte Fantasia M.D.   On: 04/01/2015 16:13    Assessment;  #1. Positive hepatitis C virus antibody. This test was performed as a workup for kidney disease and patient is at high risk for chronic hepatitis C given history of IV drug use. Transaminases since March this year have been normal. There is no history of icteric hepatitis. He does not have stigmata of chronic liver disease. His platelet count is borderline and it remains to be seen if he has splenomegaly on ultrasound to be performed in a.m. He will need follow-up testing to determine if he has a viremia or active infection in which case he would be candidate for anti-viral therapy.  #2. Anemia. No history of melena or rectal bleeding. B12 and folate levels are normal. Serum iron is normal but saturation is 16% serum ferritin is 85. Suspect patient has anemia due to chronic kidney disease. Haptoglobin has been ordered to rule out hemolysis.  #3. Hypertension.  #4. Renal failure. Patient is undergoing evaluation by Dr. Hinda Lenis and associates to determine the etiology of this worsening renal failure. Serum creatinine 3 months ago was 2.42 and admission was 6.83.  #5. Drug abuse. Patient is now on methadone and denies using IV or oral medications.   Recommendations;  Upper abdominal ultrasound in a.m.  INR, hepatitis B surface antibody and HCVRNA quantitative by PCR and if positive to be followed by genotype. Other recommendations to follow.    LOS: 1 day   REHMAN,NAJEEB U  04/02/2015, 4:17  PM

## 2015-04-02 NOTE — Consult Note (Signed)
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 Came to [facility] ER, treated patient for HTN and told him he had some damage to kidneys. They gave him follow up information but he choose to not follow up.He states that ahe felt well and "did not take it seriously."   He came to ER 04/01/15 secondary to his "head pounding, vomiting, and high BP." He was seen in the methadone clinic and advised his BP was markedly elevated. He was instructed to go to the ED.    He currently reports feeling better. Nausea is resolved. Headaches are gone. Hematology consulted secondary to suspicion of hemolysis.  PAST MEDICAL HISTORY:   Past Medical History  Diagnosis Date  . Psoriasis   . Hypertension   . Substance abuse     methadone clinic  . Anemia   . Thrombocytopenia   . Acute on chronic renal failure   . CKD (chronic kidney disease)     ALLERGIES: Allergies  Allergen Reactions  . Penicillins Other (See Comments)    Child hood allergy      MEDICATIONS: I have reviewed the patient's current medications.     PAST SURGICAL HISTORY History reviewed. No pertinent past surgical history.  FAMILY HISTORY:   Biological mother age 67 living. Biological father died. Father had hx of HTN. 2 half sisters (1 died). 3 half brothers.   SOCIAL HISTORY: Single. No children. 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 No alcohol. [facility] for treatment of IV oxycodone drug usage x several years. Mother helped pt get into methadone clinic.  PERFORMANCE STATUS: The patient's  performance status is 1 - Symptomatic but completely ambulatory  Review of Systems  Constitutional: Negative for fever, chills, weight loss and malaise/fatigue.  HENT: Negative for congestion, hearing loss, nosebleeds, sore throat and tinnitus.   Eyes: Negative for blurred vision, double vision, pain and discharge.  Respiratory: Negative for cough, hemoptysis, sputum production, shortness of breath and wheezing.   Cardiovascular: Negative for chest pain, palpitations, claudication, leg swelling and PND.  Gastrointestinal: Negative for heartburn, nausea, vomiting, abdominal pain, diarrhea, constipation, blood in stool and melena.  Genitourinary: Negative for dysuria, urgency, frequency and hematuria.  Musculoskeletal: Negative for myalgias, joint pain and falls.  Skin: Negative for itching and rash.  Neurological: Negative for dizziness, tingling, tremors, sensory change, speech change, focal weakness, seizures, loss of consciousness, weakness and headaches.  Endo/Heme/Allergies: Does not bruise/bleed easily.  Psychiatric/Behavioral: Negative for depression, suicidal ideas, memory loss and substance abuse. The patient is not nervous/anxious and does not have insomnia.     PHYSICAL EXAMINATION  ECOG PERFORMANCE STATUS: 1 - Symptomatic but completely ambulatory  Filed Vitals:   04/02/15 1100  BP: 145/88  Pulse: 75  Temp:   Resp: 12    Physical Exam  Constitutional: He is oriented to person, place, and time and well-developed, well-nourished, and in no distress.  HENT:  Head: Normocephalic and atraumatic.  Nose: Nose normal.  Mouth/Throat: Oropharynx is clear and moist. No oropharyngeal exudate.  Eyes: Conjunctivae and EOM are normal. Pupils  are equal, round, and reactive to light. Right eye exhibits no discharge. Left eye exhibits no discharge. No scleral icterus.  Neck: Normal range of motion. Neck supple. No tracheal deviation present. No thyromegaly present.  Cardiovascular:  Normal rate, regular rhythm and normal heart sounds.  Exam reveals no gallop and no friction rub.   No murmur heard. Pulmonary/Chest: Effort normal and breath sounds normal. He has no wheezes. He has no rales.  Abdominal: Soft. Bowel sounds are normal. He exhibits no distension and no mass. There is no tenderness. There is no rebound and no guarding.  Musculoskeletal: Normal range of motion. He exhibits no edema.  Lymphadenopathy:    He has no cervical adenopathy.  Neurological: He is alert and oriented to person, place, and time. He has normal reflexes. No cranial nerve deficit. Gait normal. Coordination normal.  Skin: Skin is warm and dry. No rash noted.  Psychiatric: Mood, memory, affect and judgment normal.  Nursing note and vitals reviewed.   LABORATORY DATA:  Results for orders placed or performed during the hospital encounter of 04/01/15 (from the past 48 hour(s))  CBC with Differential/Platelet     Status: Abnormal   Collection Time: 04/01/15  8:47 AM  Result Value Ref Range   WBC 4.4 4.0 - 10.5 K/uL   RBC 3.78 (L) 4.22 - 5.81 MIL/uL   Hemoglobin 11.5 (L) 13.0 - 17.0 g/dL   HCT 32.7 (L) 39.0 - 52.0 %   MCV 86.5 78.0 - 100.0 fL   MCH 30.4 26.0 - 34.0 pg   MCHC 35.2 30.0 - 36.0 g/dL   RDW 12.4 11.5 - 15.5 %   Platelets 145 (L) 150 - 400 K/uL   Neutrophils Relative % 58 43 - 77 %   Neutro Abs 2.6 1.7 - 7.7 K/uL   Lymphocytes Relative 25 12 - 46 %   Lymphs Abs 1.1 0.7 - 4.0 K/uL   Monocytes Relative 12 3 - 12 %   Monocytes Absolute 0.5 0.1 - 1.0 K/uL   Eosinophils Relative 4 0 - 5 %   Eosinophils Absolute 0.2 0.0 - 0.7 K/uL   Basophils Relative 1 0 - 1 %   Basophils Absolute 0.0 0.0 - 0.1 K/uL  Comprehensive metabolic panel     Status: Abnormal   Collection Time: 04/01/15  8:47 AM  Result Value Ref Range   Sodium 137 135 - 145 mmol/L   Potassium 3.9 3.5 - 5.1 mmol/L   Chloride 95 (L) 101 - 111 mmol/L   CO2 26 22 - 32 mmol/L   Glucose, Bld 96 65 - 99 mg/dL   BUN 58 (H)  6 - 20 mg/dL   Creatinine, Ser 6.83 (H) 0.61 - 1.24 mg/dL   Calcium 8.2 (L) 8.9 - 10.3 mg/dL   Total Protein 6.7 6.5 - 8.1 g/dL   Albumin 3.4 (L) 3.5 - 5.0 g/dL   AST 35 15 - 41 U/L   ALT 46 17 - 63 U/L   Alkaline Phosphatase 92 38 - 126 U/L   Total Bilirubin 0.6 0.3 - 1.2 mg/dL   GFR calc non Af Amer 9 (L) >60 mL/min   GFR calc Af Amer 11 (L) >60 mL/min    Comment: (NOTE) The eGFR has been calculated using the CKD EPI equation. This calculation has not been validated in all clinical situations. eGFR's persistently <60 mL/min signify possible Chronic Kidney Disease.    Anion gap 16 (H) 5 - 15  Lipase, blood     Status: None  Collection Time: 04/01/15  8:47 AM  Result Value Ref Range   Lipase 51 22 - 51 U/L  Troponin I     Status: Abnormal   Collection Time: 04/01/15  8:47 AM  Result Value Ref Range   Troponin I 0.04 (H) <0.031 ng/mL    Comment:        PERSISTENTLY INCREASED TROPONIN VALUES IN THE RANGE OF 0.04-0.49 ng/mL CAN BE SEEN IN:       -UNSTABLE ANGINA       -CONGESTIVE HEART FAILURE       -MYOCARDITIS       -CHEST TRAUMA       -ARRYHTHMIAS       -LATE PRESENTING MYOCARDIAL INFARCTION       -COPD   CLINICAL FOLLOW-UP RECOMMENDED.   Brain natriuretic peptide     Status: Abnormal   Collection Time: 04/01/15  8:47 AM  Result Value Ref Range   B Natriuretic Peptide 447.0 (H) 0.0 - 100.0 pg/mL  Urinalysis, Routine w reflex microscopic (not at [facility])     Status: Abnormal   Collection Time: 04/01/15 10:10 AM  Result Value Ref Range   Color, Urine YELLOW YELLOW   APPearance CLEAR CLEAR   Specific Gravity, Urine 1.020 1.005 - 1.030   pH 7.0 5.0 - 8.0   Glucose, UA 100 (A) NEGATIVE mg/dL   Hgb urine dipstick MODERATE (A) NEGATIVE   Bilirubin Urine NEGATIVE NEGATIVE   Ketones, ur NEGATIVE NEGATIVE mg/dL   Protein, ur >300 (A) NEGATIVE mg/dL   Urobilinogen, UA 0.2 0.0 - 1.0 mg/dL   Nitrite NEGATIVE NEGATIVE   Leukocytes, UA NEGATIVE NEGATIVE  Urine microscopic-add  on     Status: None   Collection Time: 04/01/15 10:10 AM  Result Value Ref Range   RBC / HPF 7-10 <3 RBC/hpf  Urine rapid drug screen (hosp performed)     Status: None   Collection Time: 04/01/15 10:10 AM  Result Value Ref Range   Opiates NONE DETECTED NONE DETECTED   Cocaine NONE DETECTED NONE DETECTED   Benzodiazepines NONE DETECTED NONE DETECTED   Amphetamines NONE DETECTED NONE DETECTED   Tetrahydrocannabinol NONE DETECTED NONE DETECTED   Barbiturates NONE DETECTED NONE DETECTED    Comment:        DRUG SCREEN FOR MEDICAL PURPOSES ONLY.  IF CONFIRMATION IS NEEDED FOR ANY PURPOSE, NOTIFY LAB WITHIN 5 DAYS.        LOWEST DETECTABLE LIMITS FOR URINE DRUG SCREEN Drug Class       Cutoff (ng/mL) Amphetamine      1000 Barbiturate      200 Benzodiazepine   474 Tricyclics       259 Opiates          300 Cocaine          300 THC              50   Troponin I     Status: Abnormal   Collection Time: 04/01/15  5:02 PM  Result Value Ref Range   Troponin I 0.05 (H) <0.031 ng/mL    Comment:        PERSISTENTLY INCREASED TROPONIN VALUES IN THE RANGE OF 0.04-0.49 ng/mL CAN BE SEEN IN:       -UNSTABLE ANGINA       -CONGESTIVE HEART FAILURE       -MYOCARDITIS       -CHEST TRAUMA       -ARRYHTHMIAS       -LATE PRESENTING MYOCARDIAL  INFARCTION       -COPD   CLINICAL FOLLOW-UP RECOMMENDED.   Vit D  25 hydroxy (rtn osteoporosis monitoring)     Status: None   Collection Time: 04/01/15  5:02 PM  Result Value Ref Range   Vit D, 25-Hydroxy 45.8 30.0 - 100.0 ng/mL    Comment: (NOTE) Vitamin D deficiency has been defined by the [place] practice guideline as a level of serum 25-OH vitamin D less than 20 ng/mL (1,2). The Endocrine Society went on to further define vitamin D insufficiency as a level between 21 and 29 ng/mL (2). 1. IOM (Institute of Medicine). 2010. Dietary reference   intakes for calcium and D. [place]: The   [employer]. 2. Holick MF, Binkley Barber, Bischoff-Ferrari HA, et al.   Evaluation, treatment, and prevention of vitamin D   deficiency: an Endocrine Society clinical practice   guideline. JCEM. 2011 Jul; 96(7):1911-30. Performed At: [facility] [place], [place] [id number] Lindon Romp MD [id number]   Hepatitis panel, acute     Status: Abnormal   Collection Time: 04/01/15  5:02 PM  Result Value Ref Range   Hepatitis B Surface Ag Negative Negative   HCV Ab >11.0 (H) 0.0 - 0.9 s/co ratio    Comment: (NOTE)                                  Negative:     < 0.8                             Indeterminate: 0.8 - 0.9                                  Positive:     > 0.9 The CDC recommends that a positive HCV antibody result be followed up with a HCV Nucleic Acid Amplification test ([id number]). Performed At: [facility] [place], [place] [id number] Lindon Romp MD [id number]    Hep A IgM Negative Negative   Hep B C IgM Negative Negative  C3 complement     Status: None   Collection Time: 04/01/15  5:02 PM  Result Value Ref Range   C3 Complement 114 82 - 167 mg/dL    Comment: (NOTE) Performed At: [facility] [address] [place], [place] [id number] Lindon Romp MD [id number]   C4 complement     Status: None   Collection Time: 04/01/15  5:02 PM  Result Value Ref Range   Complement C4, Body Fluid 25 14 - 44 mg/dL    Comment: (NOTE) Performed At: [facility] [place], [place] [id number] Lindon Romp MD [id number]   CK     Status: None   Collection Time: 04/01/15  5:02 PM  Result Value Ref Range   Total CK 189 49 - 397 U/L  Haptoglobin     Status: Abnormal   Collection Time: 04/01/15  5:02 PM  Result Value Ref Range   Haptoglobin <10 (L) 34 - 200 mg/dL    Comment: (NOTE) Performed At: [facility] [place], [place] [id number] Lindon Romp MD [id number]    Lactate dehydrogenase     Status: Abnormal   Collection Time: 04/01/15  5:02 PM  Result Value Ref Range   LDH 441 (H) 98 - 192 U/L  MRSA PCR Screening     Status: None   Collection Time: 04/01/15  6:30 PM  Result Value Ref Range   MRSA by PCR NEGATIVE NEGATIVE    Comment:        The GeneXpert MRSA Assay (FDA approved for NASAL specimens only), is one component of a comprehensive MRSA colonization surveillance program. It is not intended to diagnose MRSA infection nor to guide or monitor treatment for MRSA infections.   Troponin I     Status: Abnormal   Collection Time: 04/01/15 10:32 PM  Result Value Ref Range   Troponin I 0.05 (H) <0.031 ng/mL    Comment:        PERSISTENTLY INCREASED TROPONIN VALUES IN THE RANGE OF 0.04-0.49 ng/mL CAN BE SEEN IN:       -UNSTABLE ANGINA       -CONGESTIVE HEART FAILURE       -MYOCARDITIS       -CHEST TRAUMA       -ARRYHTHMIAS       -LATE PRESENTING MYOCARDIAL INFARCTION       -COPD   CLINICAL FOLLOW-UP RECOMMENDED.   Comprehensive metabolic panel     Status: Abnormal   Collection Time: 04/02/15  4:25 AM  Result Value Ref Range   Sodium 138 135 - 145 mmol/L   Potassium 3.6 3.5 - 5.1 mmol/L   Chloride 98 (L) 101 - 111 mmol/L   CO2 26 22 - 32 mmol/L   Glucose, Bld 100 (H) 65 - 99 mg/dL   BUN 67 (H) 6 - 20 mg/dL   Creatinine, Ser 7.21 (H) 0.61 - 1.24 mg/dL   Calcium 8.0 (L) 8.9 - 10.3 mg/dL   Total Protein 5.9 (L) 6.5 - 8.1 g/dL   Albumin 3.0 (L) 3.5 - 5.0 g/dL   AST 24 15 - 41 U/L   ALT 35 17 - 63 U/L   Alkaline Phosphatase 75 38 - 126 U/L   Total Bilirubin 0.7 0.3 - 1.2 mg/dL   GFR calc non Af Amer 9 (L) >60 mL/min   GFR calc Af Amer 10 (L) >60 mL/min    Comment: (NOTE) The eGFR has been calculated using the CKD EPI equation. This calculation has not been validated in all clinical situations. eGFR's persistently <60 mL/min signify possible Chronic Kidney Disease.    Anion gap 14 5 - 15  CBC     Status: Abnormal    Collection Time: 04/02/15  4:25 AM  Result Value Ref Range   WBC 5.4 4.0 - 10.5 K/uL   RBC 3.58 (L) 4.22 - 5.81 MIL/uL   Hemoglobin 10.9 (L) 13.0 - 17.0 g/dL   HCT 31.2 (L) 39.0 - 52.0 %   MCV 87.2 78.0 - 100.0 fL   MCH 30.4 26.0 - 34.0 pg   MCHC 34.9 30.0 - 36.0 g/dL   RDW 12.5 11.5 - 15.5 %   Platelets 147 (L) 150 - 400 K/uL  Phosphorus     Status: Abnormal   Collection Time: 04/02/15  4:25 AM  Result Value Ref Range   Phosphorus 8.0 (H) 2.5 - 4.6 mg/dL  Save smear     Status: None   Collection Time: 04/02/15  4:25 AM  Result Value Ref Range   Smear Review SMEAR STAINED AND AVAILABLE FOR REVIEW   Lactate dehydrogenase     Status: Abnormal   Collection Time: 04/02/15  4:25 AM  Result Value  Ref Range   LDH 349 (H) 98 - 192 U/L  Reticulocytes     Status: Abnormal   Collection Time: 04/02/15 10:16 AM  Result Value Ref Range   Retic Ct Pct 1.1 0.4 - 3.1 %   RBC. 3.56 (L) 4.22 - 5.81 MIL/uL   Retic Count, Manual 39.2 19.0 - 186.0 K/uL      RADIOGRAPHY: X-ray Chest Pa And Lateral  04/01/2015   CLINICAL DATA:  One day history of shortness of breath  EXAM: CHEST  2 VIEW  COMPARISON:  None.  FINDINGS: There is no edema or consolidation. The heart size and pulmonary vascularity are normal. No adenopathy. No bone lesions.  IMPRESSION: No edema or consolidation.   Electronically Signed   By: Lowella Grip III M.D.   On: 04/01/2015 15:25   Ct Head Wo Contrast  04/01/2015   CLINICAL DATA:  Headache for 2 days with nausea and vomiting and blurred vision.  EXAM: CT HEAD WITHOUT CONTRAST  TECHNIQUE: Contiguous axial images were obtained from the base of the skull through the vertex without intravenous contrast.  COMPARISON:  12/25/2014  FINDINGS: No mass lesion. No midline shift. No acute hemorrhage or hematoma. No extra-axial fluid collections. No evidence of acute infarction. Brain parenchyma is normal. Osseous structures are normal.  IMPRESSION: Normal exam.   Electronically Signed   By:  Lorriane Shire M.D.   On: 04/01/2015 13:57   US Renal  04/01/2015   CLINICAL DATA:  Increased creatinine  EXAM: RENAL / URINARY TRACT ULTRASOUND COMPLETE  COMPARISON:  None.  FINDINGS: Right Kidney:  Length: 11 cm.  Echogenic cortex.  No hydronephrosis or mass lesion  Left Kidney:  Length: 11 cm.  Echogenic cortex.  No hydronephrosis or mass lesion  Bladder:  Appears normal for degree of bladder distention. Patent bilateral ureters with jets.  IMPRESSION: 1. Medical renal disease without atrophy. 2. No hydronephrosis.   Electronically Signed   By: Monte Fantasia M.D.   On: 04/01/2015 16:13       ASSESSMENT:  Anemia Hypertensive Crisis Acute on Chronic kidney disease Positive hepatitis C antibody History of IVDA Elevated LDH, low haptoglobin, coombs negative Polyclonal gammopathy  PLAN:  He most likely had microangiopathic hemolytic anemia secondary to his hypertensive crisis. Although anemic, counts appear to have stabilized with improvement in his BP. Would repeat a CBC in the am. Microangiopathic hemolytic anemia resolves by treating the initiating cause.  He may also have a baseline anemia secondary to his CKD. He also has evidence of mild iron deficiency and potentially hep C which may also contribute to his anemia.  His low haptoglobin and elevated LDH may be secondary to liver disease as well. Although I do believe he had a component of hemolysis on admission. Will continue to follow LDH. Smear will be reviewed.  Nephrology and GI following as well. Plasma metanephrine's ordered.  All questions were answered. Will follow-up in am. Have ordered additional labs for am as well. I advised patient that I would like follow-up as outpatient as well. This note was electronically signed. Molli Hazard MD

## 2015-04-02 NOTE — Progress Notes (Signed)
[facility] PROGRESS NOTE  Terrance Reynolds [id number] DOB: [birth year] DOA: 04/01/2015 PCP: No PCP Per Patient  Assessment/Plan: Hypertensive crisis: Likely related to noncompliance. Labetalol gttt started and BP controlled. Will transition to oral. Discussed at length importance of compliance and end organ damage. Will monitor.  Active Problems:   Acute on chronic renal failure; Creatinine trending upward.  Renal US reveal medical renal disease without atrophy. No hydronephrosis. Evaluated by nephrology. Labs pending.    Elevated troponin: Denies chest pain. Likely related to above. Check a lipid panel monitor on telemetry. Will obtain a 2-D echo as patient exhibits lower extremity edema and complains of intermittent shortness of breath.  Leg edema: EKG with LVH. BNP 447. Await echo results. No events on tele.    Dyspnea: resolved this am. Chest x-ray unremarkable.  Breath sounds good on exam. He is not hypoxic. Will monitor.  Anemia: mild and trending down which may be dilutional. Concern for hemolytic. LDH elevated with low haptaglobin. Will obtain anemia panel. Request hematology consult.  Monitor  Thrombocytopenia: related to #1. See above. Stable. No signs symptoms bleeding. Monitor.  Hepatitis C: LFT within limits of normal. No hx of same. Will request GI consult.   Hx substance abuse: daily methadone at clinic. Methadone dose verified with clinic [phone]). Will resume   Code Status: full Family Communication: none present Disposition Plan: home when able   Consultants:  nephrology  Procedures:  none  Antibiotics:  none  HPI/Subjective: Sitting up reports feeling better.requesting food. Denies pain/discomfort  Objective: Filed Vitals:   04/02/15 0830  BP: 159/94  Pulse: 73  Temp:   Resp: 13    Intake/Output Summary (Last 24 hours) at 04/02/15 0851 Last data filed at 04/02/15 0600  Gross per 24 hour  Intake 274.14 ml  Output     800 ml  Net -525.86 ml   Filed Weights   04/01/15 0814 04/02/15 0500  Weight: 86.183 kg (190 lb) 79.5 kg (175 lb 4.3 oz)    Exam:   General:  Well nourished appears comfortable  Cardiovascular: RRR no MGR no LE edema  Respiratory: normal effort BS clear bilaterally no wheeze. Somewhat distant in bases  Abdomen: non-distended non-tender +BS  Musculoskeletal: no clubbing or cyanosis  Data Reviewed: Basic Metabolic Panel:  Recent Labs Lab 04/01/15 0847 04/02/15 0425  NA 137 138  K 3.9 3.6  CL 95* 98*  CO2 26 26  GLUCOSE 96 100*  BUN 58* 67*  CREATININE 6.83* 7.21*  CALCIUM 8.2* 8.0*  PHOS  --  8.0*   Liver Function Tests:  Recent Labs Lab 04/01/15 0847 04/02/15 0425  AST 35 24  ALT 46 35  ALKPHOS 92 75  BILITOT 0.6 0.7  PROT 6.7 5.9*  ALBUMIN 3.4* 3.0*    Recent Labs Lab 04/01/15 0847  LIPASE 51   No results for input(s): AMMONIA in the last 168 hours. CBC:  Recent Labs Lab 04/01/15 0847 04/02/15 0425  WBC 4.4 5.4  NEUTROABS 2.6  --   HGB 11.5* 10.9*  HCT 32.7* 31.2*  MCV 86.5 87.2  PLT 145* 147*   Cardiac Enzymes:  Recent Labs Lab 04/01/15 0847 04/01/15 1702 04/01/15 2232  CKTOTAL  --  189  --   TROPONINI 0.04* 0.05* 0.05*   BNP (last 3 results)  Recent Labs  04/01/15 0847  BNP 447.0*    ProBNP (last 3 results) No results for input(s): PROBNP in the last 8760 hours.  CBG: No results for input(s): GLUCAP in  the last 168 hours.  Recent Results (from the past 240 hour(s))  MRSA PCR Screening     Status: None   Collection Time: 04/01/15  6:30 PM  Result Value Ref Range Status   MRSA by PCR NEGATIVE NEGATIVE Final    Comment:        The GeneXpert MRSA Assay (FDA approved for NASAL specimens only), is one component of a comprehensive MRSA colonization surveillance program. It is not intended to diagnose MRSA infection nor to guide or monitor treatment for MRSA infections.      Studies: X-ray Chest Pa And  Lateral  04/01/2015   CLINICAL DATA:  One day history of shortness of breath  EXAM: CHEST  2 VIEW  COMPARISON:  None.  FINDINGS: There is no edema or consolidation. The heart size and pulmonary vascularity are normal. No adenopathy. No bone lesions.  IMPRESSION: No edema or consolidation.   Electronically Signed   By: Lowella Grip III M.D.   On: 04/01/2015 15:25   Ct Head Wo Contrast  04/01/2015   CLINICAL DATA:  Headache for 2 days with nausea and vomiting and blurred vision.  EXAM: CT HEAD WITHOUT CONTRAST  TECHNIQUE: Contiguous axial images were obtained from the base of the skull through the vertex without intravenous contrast.  COMPARISON:  12/25/2014  FINDINGS: No mass lesion. No midline shift. No acute hemorrhage or hematoma. No extra-axial fluid collections. No evidence of acute infarction. Brain parenchyma is normal. Osseous structures are normal.  IMPRESSION: Normal exam.   Electronically Signed   By: Lorriane Shire M.D.   On: 04/01/2015 13:57   US Renal  04/01/2015   CLINICAL DATA:  Increased creatinine  EXAM: RENAL / URINARY TRACT ULTRASOUND COMPLETE  COMPARISON:  None.  FINDINGS: Right Kidney:  Length: 11 cm.  Echogenic cortex.  No hydronephrosis or mass lesion  Left Kidney:  Length: 11 cm.  Echogenic cortex.  No hydronephrosis or mass lesion  Bladder:  Appears normal for degree of bladder distention. Patent bilateral ureters with jets.  IMPRESSION: 1. Medical renal disease without atrophy. 2. No hydronephrosis.   Electronically Signed   By: Monte Fantasia M.D.   On: 04/01/2015 16:13    Scheduled Meds: . enoxaparin (LOVENOX) injection  30 mg Subcutaneous Daily  . senna  1 tablet Oral BID  . sodium chloride  3 mL Intravenous Q12H   Continuous Infusions: . labetalol (NORMODYNE) infusion 1 mg/min (04/02/15 0742)    Principal Problem:   Hypertensive crisis Active Problems:   Acute renal failure   Acute on chronic renal failure   Elevated troponin   Dyspnea   Leg edema    Anemia   Thrombocytopenia   Substance abuse    Time spent: 35 minutes    [facility] Pager [phone]. If 7PM-7AM, please contact night-coverage at [email or web address], password [email or web address] 04/02/2015, 8:51 AM  LOS: 1 day

## 2015-04-02 NOTE — Progress Notes (Signed)
Report given to B.Marica Otter, RN. Pt transferred to dept 300 via wheelchair. Family accompanied at the bedside.

## 2015-04-02 NOTE — Progress Notes (Signed)
Terrance Reynolds  MRN: [id number]  DOB/AGE: [birth year] 34 y.o.  Primary Care Physician:No PCP Per Patient  Admit date: 04/01/2015  Chief Complaint:  Chief Complaint  Patient presents with  . Headache  . Hypertension    S-Pt presented on  04/01/2015 with  Chief Complaint  Patient presents with  . Headache  . Hypertension  .    Pt today feels better  Meds . enoxaparin (LOVENOX) injection  30 mg Subcutaneous Daily  . senna  1 tablet Oral BID  . sodium chloride  3 mL Intravenous Q12H      Physical Exam: Vital signs in last 24 hours: Temp:  [97.9 F (36.6 C)-98.7 F (37.1 C)] 98.3 F (36.8 C) (06/15 0800) Pulse Rate:  [65-89] 73 (06/15 0830) Resp:  [9-21] 13 (06/15 0830) BP: (117-191)/(61-134) 159/94 mmHg (06/15 0830) SpO2:  [95 %-100 %] 98 % (06/15 0830) Weight:  [175 lb 4.3 oz (79.5 kg)] 175 lb 4.3 oz (79.5 kg) (06/15 0500) Weight change:  Last BM Date: 04/01/15  Intake/Output from previous day: 06/14 0701 - 06/15 0700 In: 274.1 [I.V.:274.1] Out: 800 [Urine:800]     Physical Exam: General- pt is awake,alert, oriented to time place and person Resp- No acute REsp distress, CTA B/L NO Rhonchi CVS- S1S2 regular in rate and rhythm GIT- BS+, soft, NT, ND EXT- NO LE Edema, Cyanosis   Lab Results: CBC  Recent Labs  04/01/15 0847 04/02/15 0425  WBC 4.4 5.4  HGB 11.5* 10.9*  HCT 32.7* 31.2*  PLT 145* 147*    Hgb 15=>11.5=>10.9 plts 249=>145=>147   BMET  Recent Labs  04/01/15 0847 04/02/15 0425  NA 137 138  K 3.9 3.6  CL 95* 98*  CO2 26 26  GLUCOSE 96 100*  BUN 58* 67*  CREATININE 6.83* 7.21*  CALCIUM 8.2* 8.0*   Creat  2.42=>6.83=>7.21   MICRO Recent Results (from the past 240 hour(s))  MRSA PCR Screening     Status: None   Collection Time: 04/01/15  6:30 PM  Result Value Ref Range Status   MRSA by PCR NEGATIVE NEGATIVE Final    Comment:        The GeneXpert MRSA Assay (FDA approved for NASAL specimens only), is one  component of a comprehensive MRSA colonization surveillance program. It is not intended to diagnose MRSA infection nor to guide or monitor treatment for MRSA infections.       Lab Results  Component Value Date   CALCIUM 8.0* 04/02/2015   PHOS 8.0* 04/02/2015         Renal U/s Right Kidney:11 cm.  Left Kidney: 11 cm.  No hydronephrosis.      Impression: 1)Renal AKI sec to HTN emergency   Data in favor  Rise in creat  Hematuria  High BP    CKD stage 3 .  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 Secondary Hyperparathyroidism w/u pending. Phosphorus high Vitamin 25-OH 45-at goal  5)Substance abuse- Pt gave hx of using illegal drugs Primary team has checked urine tox screen Primary MD following  6)Electrolytes Normokalemic NOrmonatremic  7)Acid base Co2 at goal   Plan:  Will suggest primary team to ask for    Haem help- hemolytic anemia     GI help- hep C +    May need HIV testing  Will start binders for high phos  Will follow bmet  NO need of HD yet.  Will decide regarding need of renal   Biopsy depending upon the        proteinuria results        BP control       Clinical course.    Niel Peretti S 04/02/2015, 8:57 AM

## 2015-04-02 NOTE — Progress Notes (Signed)
UR chart review completed.  

## 2015-04-03 ENCOUNTER — Inpatient Hospital Stay (HOSPITAL_COMMUNITY): Payer: Medicaid Other

## 2015-04-03 DIAGNOSIS — R7989 Other specified abnormal findings of blood chemistry: Secondary | ICD-10-CM

## 2015-04-03 LAB — BASIC METABOLIC PANEL
Anion gap: 13 (ref 5–15)
BUN: 67 mg/dL — ABNORMAL HIGH (ref 6–20)
CO2: 26 mmol/L (ref 22–32)
Calcium: 8.1 mg/dL — ABNORMAL LOW (ref 8.9–10.3)
Chloride: 97 mmol/L — ABNORMAL LOW (ref 101–111)
Creatinine, Ser: 7.63 mg/dL — ABNORMAL HIGH (ref 0.61–1.24)
GFR calc Af Amer: 10 mL/min — ABNORMAL LOW (ref >60)
GFR, EST NON AFRICAN AMERICAN: 8 mL/min — AB (ref >60)
Glucose, Bld: 98 mg/dL (ref 65–99)
Potassium: 3.4 mmol/L — ABNORMAL LOW (ref 3.5–5.1)
SODIUM: 136 mmol/L (ref 135–145)

## 2015-04-03 LAB — CBC
HCT: 31.2 % — ABNORMAL LOW (ref 39.0–52.0)
Hemoglobin: 10.7 g/dL — ABNORMAL LOW (ref 13.0–17.0)
MCH: 30 pg (ref 26.0–34.0)
MCHC: 34.3 g/dL (ref 30.0–36.0)
MCV: 87.4 fL (ref 78.0–100.0)
Platelets: 142 10*3/uL — ABNORMAL LOW (ref 150–400)
RBC: 3.57 MIL/uL — ABNORMAL LOW (ref 4.22–5.81)
RDW: 12.4 % (ref 11.5–15.5)
WBC: 4.1 10*3/uL (ref 4.0–10.5)

## 2015-04-03 LAB — HAPTOGLOBIN

## 2015-04-03 LAB — UIFE/LIGHT CHAINS/TP QN, 24-HR UR
% BETA, Urine: 14.4 %
ALPHA 1 URINE: 7.5 %
Albumin, U: 63.2 %
Alpha 2, Urine: 6.7 %
Free Kappa/Lambda Ratio: 3.41 (ref 2.04–10.37)
Free Lambda Lt Chains,Ur: 122 mg/L — ABNORMAL HIGH (ref 0.24–6.66)
Free Lt Chn Excr Rate: 416 mg/L — ABNORMAL HIGH (ref 1.35–24.19)
GAMMA GLOBULIN URINE: 8.1 %
Total Protein, Urine: 566 mg/dL

## 2015-04-03 LAB — METANEPHRINES, PLASMA
Metanephrine, Free: 50 pg/mL (ref 0–62)
Normetanephrine, Free: 151 pg/mL — ABNORMAL HIGH (ref 0–145)

## 2015-04-03 LAB — ALDOSTERONE + RENIN ACTIVITY W/ RATIO
ALDO / PRA Ratio: 1.6 (ref 0.0–30.0)
Aldosterone: 4.9 ng/dL (ref 0.0–30.0)
PRA LC/MS/MS: 2.99 ng/mL/hr

## 2015-04-03 LAB — PROTIME-INR
INR: 1.06 (ref 0.00–1.49)
Prothrombin Time: 14 seconds (ref 11.6–15.2)

## 2015-04-03 LAB — PTH, INTACT AND CALCIUM
Calcium, Total (PTH): 8.2 mg/dL — ABNORMAL LOW (ref 8.7–10.2)
PTH: 118 pg/mL — ABNORMAL HIGH (ref 15–65)

## 2015-04-03 MED ORDER — AMLODIPINE BESYLATE 5 MG PO TABS
5.0000 mg | ORAL_TABLET | Freq: Every day | ORAL | Status: DC
  Administered 2015-04-03: 5 mg via ORAL
  Filled 2015-04-03: qty 1

## 2015-04-03 MED ORDER — LABETALOL HCL 200 MG PO TABS
300.0000 mg | ORAL_TABLET | Freq: Two times a day (BID) | ORAL | Status: DC
  Administered 2015-04-03 – 2015-04-07 (×9): 300 mg via ORAL
  Filled 2015-04-03 (×9): qty 2

## 2015-04-03 MED ORDER — HYDRALAZINE HCL 20 MG/ML IJ SOLN
10.0000 mg | INTRAMUSCULAR | Status: DC | PRN
  Administered 2015-04-03 – 2015-04-05 (×3): 10 mg via INTRAVENOUS
  Filled 2015-04-03 (×3): qty 1

## 2015-04-03 MED ORDER — SODIUM CHLORIDE 0.45 % IV SOLN
INTRAVENOUS | Status: DC
  Administered 2015-04-03 (×2): via INTRAVENOUS
  Filled 2015-04-03 (×3): qty 1000

## 2015-04-03 MED ORDER — POTASSIUM CHLORIDE CRYS ER 20 MEQ PO TBCR
40.0000 meq | EXTENDED_RELEASE_TABLET | ORAL | Status: AC
  Administered 2015-04-03 (×2): 40 meq via ORAL
  Filled 2015-04-03 (×2): qty 2

## 2015-04-03 NOTE — Progress Notes (Signed)
[facility] PROGRESS NOTE  Terrance Reynolds [id number] DOB: [birth year] DOA: 04/01/2015 PCP: No PCP Per Patient  Assessment/Plan: Hypertensive crisis: Likely related to noncompliance. Poor control on labetelol oral. Amlodipine added. Will monitor. [name]  Active Problems:   Acute on chronic renal failure; secondary to #1.  Creatinine continues to worsen. Evaluated by nephrology who opine if continues to worsen may need dialysis.   Elevated troponin: Denies chest pain. Likely related to above. No events on tele. 2-D echo with mild LVH and grade 2 diastolic dysfunction.   Leg edema: improving.  EKG with LVH. BNP 447. See above    Dyspnea: resolved.  Anemia:  LDH elevated with low haptaglobin. 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 Treatment is to treating cause. Appreciate heme help.   Thrombocytopenia: related to #1. See above. Stable. No signs symptoms bleeding. Monitor.   Positive Hepatitis C antibody: evaluated by GI who opines stigmata of chronic liver disease. Upper abdominal US unremarkable. HCVRNA in process as is Hep B surface antigen  Hx substance abuse: daily methadone at clinic. Methadone dose verified with clinic [phone] Will resume   Code Status: full Family Communication: none Disposition Plan: home hopefully in am   Consultants:  GI  Heme  nephrology  Procedures:  none  Antibiotics:  none  HPI/Subjective: Sitting on side of bed. Reports feeling better. Denies headache  Objective: Filed Vitals:   04/03/15 1435  BP: 157/96  Pulse: 76  Temp: 98.6 F (37 C)  Resp: 20    Intake/Output Summary (Last 24 hours) at 04/03/15 1505 Last data filed at 04/03/15 1436  Gross per 24 hour  Intake    243 ml  Output    800 ml  Net   -557 ml   Filed Weights   04/01/15 0814 04/02/15 0500  Weight: 86.183 kg (190 lb)  79.5 kg (175 lb 4.3 oz)    Exam:   General:  Appears well  Cardiovascular: RRR no MGR no LE edema  Respiratory: normal effort BS clear bilaterally  Abdomen: non-distended non-tender +BS  Musculoskeletal: no clubbing or cyanosis  Data Reviewed: Basic Metabolic Panel:  Recent Labs Lab 04/01/15 0847 04/02/15 0425 04/03/15 0604  NA 137 138 136  K 3.9 3.6 3.4*  CL 95* 98* 97*  CO2 26 26 26   GLUCOSE 96 100* 98  BUN 58* 67* 67*  CREATININE 6.83* 7.21* 7.63*  CALCIUM 8.2* 8.0*  8.2* 8.1*  PHOS  --  8.0*  --    Liver Function Tests:  Recent Labs Lab 04/01/15 0847 04/02/15 0425  AST 35 24  ALT 46 35  ALKPHOS 92 75  BILITOT 0.6 0.7  PROT 6.7 5.9*  ALBUMIN 3.4* 3.0*    Recent Labs Lab 04/01/15 0847  LIPASE 51   No results for input(s): AMMONIA in the last 168 hours. CBC:  Recent Labs Lab 04/01/15 0847 04/02/15 0425 04/03/15 0604  WBC 4.4 5.4 4.1  NEUTROABS 2.6  --   --   HGB 11.5* 10.9* 10.7*  HCT 32.7* 31.2* 31.2*  MCV 86.5 87.2 87.4  PLT 145* 147* 142*   Cardiac Enzymes:  Recent Labs Lab 04/01/15 0847 04/01/15 1702 04/01/15 2232  CKTOTAL  --  189  --   TROPONINI 0.04* 0.05* 0.05*   BNP (last 3 results)  Recent Labs  04/01/15 0847  BNP 447.0*    ProBNP (last 3 results) No results  for input(s): PROBNP in the last 8760 hours.  CBG: No results for input(s): GLUCAP in the last 168 hours.  Recent Results (from the past 240 hour(s))  MRSA PCR Screening     Status: None   Collection Time: 04/01/15  6:30 PM  Result Value Ref Range Status   MRSA by PCR NEGATIVE NEGATIVE Final    Comment:        The GeneXpert MRSA Assay (FDA approved for NASAL specimens only), is one component of a comprehensive MRSA colonization surveillance program. It is not intended to diagnose MRSA infection nor to guide or monitor treatment for MRSA infections.      Studies: X-ray Chest Pa And Lateral  04/01/2015   CLINICAL DATA:  One day history of  shortness of breath  EXAM: CHEST  2 VIEW  COMPARISON:  None.  FINDINGS: There is no edema or consolidation. The heart size and pulmonary vascularity are normal. No adenopathy. No bone lesions.  IMPRESSION: No edema or consolidation.   Electronically Signed   By: Lowella Grip III M.D.   On: 04/01/2015 15:25   US Abdomen Complete  04/03/2015   CLINICAL DATA:  Hepatitis C  EXAM: ULTRASOUND ABDOMEN COMPLETE  COMPARISON:  None.  FINDINGS: Gallbladder: No gallstones, gallbladder wall thickening, or pericholecystic fluid. Negative sonographic Murphy's sign.  Common bile duct: Diameter: 5 mm  Liver: No focal lesion identified. Within normal limits in parenchymal echogenicity.  IVC: No abnormality visualized.  Pancreas: Visualized portion unremarkable. Two peripancreatic nodes measuring up to 13 mm short axis.  Spleen: Size and appearance within normal limits.  Right Kidney: Length: 11.0 cm. Echogenic renal parenchyma. No mass or hydronephrosis.  Left Kidney: Length: 11.1 cm. Echogenic renal parenchyma. No mass or hydronephrosis.  Abdominal aorta: No aneurysm visualized.  Other findings: None.  IMPRESSION: Echogenic renal parenchyma, suggesting medical renal disease. No hydronephrosis.  Otherwise negative abdominal ultrasound.   Electronically Signed   By: Julian Hy M.D.   On: 04/03/2015 11:52   US Renal  04/01/2015   CLINICAL DATA:  Increased creatinine  EXAM: RENAL / URINARY TRACT ULTRASOUND COMPLETE  COMPARISON:  None.  FINDINGS: Right Kidney:  Length: 11 cm.  Echogenic cortex.  No hydronephrosis or mass lesion  Left Kidney:  Length: 11 cm.  Echogenic cortex.  No hydronephrosis or mass lesion  Bladder:  Appears normal for degree of bladder distention. Patent bilateral ureters with jets.  IMPRESSION: 1. Medical renal disease without atrophy. 2. No hydronephrosis.   Electronically Signed   By: Monte Fantasia M.D.   On: 04/01/2015 16:13    Scheduled Meds: . amLODipine  5 mg Oral Daily  . calcium  acetate  667 mg Oral TID WC  . enoxaparin (LOVENOX) injection  30 mg Subcutaneous Daily  . labetalol  300 mg Oral BID  . methadone  75 mg Oral Daily  . senna  1 tablet Oral BID  . sodium chloride  3 mL Intravenous Q12H   Continuous Infusions: . sodium chloride 0.45 % with kcl 100 mL/hr at 04/03/15 1313    Principal Problem:   Hypertensive crisis Active Problems:   Acute renal failure   Acute on chronic renal failure   Elevated troponin   Dyspnea   Leg edema   Anemia   Thrombocytopenia   Substance abuse   Hepatitis C    Time spent: 35 minutes    [facility] Pager [phone] f 7PM-7AM, please contact night-coverage at [email or web address], password [facility] 04/03/2015, 3:05 PM  LOS: 2 days

## 2015-04-03 NOTE — Progress Notes (Signed)
  Subjective:  Patient feels better this afternoon. States he had headache and nausea when he woke up. His appetite is not good. He is eating at least 50% of each meal. He denies abdominal pain.   Objective: Blood pressure 166/105, pulse 70, temperature 97.8 F (36.6 C), temperature source Oral, resp. rate 20, height 6\' 2"  (1.88 m), weight 175 lb 4.3 oz (79.5 kg), SpO2 100 %. Patient is alert and in no acute distress.   Labs/studies Results:   Recent Labs  04/01/15 0847 04/02/15 0425 04/03/15 0604  WBC 4.4 5.4 4.1  HGB 11.5* 10.9* 10.7*  HCT 32.7* 31.2* 31.2*  PLT 145* 147* 142*    BMET   Recent Labs  04/01/15 0847 04/02/15 0425 04/03/15 0604  NA 137 138 136  K 3.9 3.6 3.4*  CL 95* 98* 97*  CO2 26 26 26   GLUCOSE 96 100* 98  BUN 58* 67* 67*  CREATININE 6.83* 7.21* 7.63*  CALCIUM 8.2* 8.0*  8.2* 8.1*    LFT   Recent Labs  04/01/15 0847 04/02/15 0425  PROT 6.7 5.9*  ALBUMIN 3.4* 3.0*  AST 35 24  ALT 46 35  ALKPHOS 92 75  BILITOT 0.6 0.7    PT/INR   Recent Labs  04/03/15 0604  LABPROT 14.0  INR 1.06    Hepatitis B surface antibody is pending HCVRNA by PCR is pending.  Ultrasound reveals normal liver and spleen. CBD is 5 mm. Echogenic renal parenchyma.  Assessment:  #1. Positive HCV antibody. Ultrasound does not show any abnormality to suggest chronic liver disease. Transaminases are normal. Will have to wait for result of HCVRNA before making further recommendations. #2. Anemia. Hemoglobin has dropped by 0.8 g since admission no evidence of overt GI bleed or gross immature year. Anemia felt to be multifactorial including low-grade hemolysis and Dr. Whitney Muse feels he has evidence of mild iron deficiency. #3. Renal failure. Renal function is not improving. He may need to go on dialysis as indicated by Dr. Hinda Lenis. Etiology of kidney disease felt to be hypertension and/or polysubstance abuse. It remains to be confirmed whether or not he has  hepatitis  C. #4. Hypertension.  Recommendations:  Hemoccults 1 Await results of HCV RNA,.

## 2015-04-03 NOTE — Progress Notes (Signed)
Recheck pt BP 177/104, notified Dr Roderic Palau.  PRN Hydralazine modified and ok to give another dose now.  Will continue to monitor

## 2015-04-03 NOTE — Progress Notes (Signed)
   04/03/15 1907  Vitals  BP (!) 173/109 mmHg  Paged on call for Hospitalists with pt BP remaining elevated after PRN Hydralazine given at 1615 and 1715.  Recheck BP around 2100 and page with results for any new orders.

## 2015-04-03 NOTE — Progress Notes (Addendum)
Paged Dr.Kirby again regarding pt's previous bp. No new orders at this time. Will continue to monitor  At [place] called and states "I didn't call back because I am ok with that blood pressure due to his diagnosis." I will continue to monitor pt.

## 2015-04-03 NOTE — Progress Notes (Signed)
Subjective: Patient is complaining of headache this morning otherwise he feels okay. He denies any difficulty breathing. No nausea or vomiting.   Objective: Vital signs in last 24 hours: Temp:  [97.8 F (36.6 C)-98 F (36.7 C)] 97.9 F (36.6 C) (06/16 0619) Pulse Rate:  [66-86] 73 (06/16 0619) Resp:  [11-18] 14 (06/16 0619) BP: (132-191)/(68-115) 182/100 mmHg (06/16 0829) SpO2:  [92 %-100 %] 100 % (06/16 0619)  Intake/Output from previous day: 06/15 0701 - 06/16 0700 In: -  Out: 800 [Urine:800] Intake/Output this shift: Total I/O In: 3 [I.V.:3] Out: -    Recent Labs  04/01/15 0847 04/02/15 0425 04/03/15 0604  HGB 11.5* 10.9* 10.7*    Recent Labs  04/02/15 0425 04/02/15 1016 04/03/15 0604  WBC 5.4  --  4.1  RBC 3.58* 3.56* 3.57*  HCT 31.2*  --  31.2*  PLT 147*  --  142*    Recent Labs  04/02/15 0425 04/03/15 0604  NA 138 136  K 3.6 3.4*  CL 98* 97*  CO2 26 26  BUN 67* 67*  CREATININE 7.21* 7.63*  GLUCOSE 100* 98  CALCIUM 8.0*  8.2* 8.1*    Recent Labs  04/03/15 0604  INR 1.06    Generally patient is alert and in no apparent distress Chest is clear to auscultation His heart exam revealed regular rate and rhythm no murmur Extremities no edema.  Assessment/Plan: Problem #1 acute kidney injury superimposed on chronic: Most likely secondary to malignant hypertension. His renal function seems to be worsening. At this moment superimposed prerenal syndrome cannot be ruled out. Patient denies any nausea vomiting and presently doesn't require dialysis. Problem #2 hypertension: Primary versus secondary hypertension. Patient with hypokalemia at this moment primary primary and secondary hyperaldosteronism cannot be ruled out. Renin and aldo level is pending. His blood pressure is high in dose of labetalol has been increased this morning. Problem #3 anemia: Patient with elevated LDH, low heptoglobin, thrombocytopenia. This could be also related to his  malignant hypertension it was of other etiologies cannot be ruled out. Patient CPK is normal Problem #4 chronic renal failure: Most likely from hypertension/hepatitis C infection/history of polysubstance abuse Problem #5 proteinuria: Possibly a compression of hepatitis C infection and also hypertension. Problem #6 secondary hyperparathyroidism: His PTH is 118. Problem #7 hypokalemia: Potassium 3.4. Plan: We'll start patient on half-normal saline at 100 mL per hour We'll check his basic metabolic panel in the morning If his renal function continued to get worse to become symptomatic would consider initiating dialysis. We'll check serum for cryoglobulin.    Haidan Nhan S 04/03/2015, 9:13 AM

## 2015-04-03 NOTE — Progress Notes (Signed)
Pt requested that his BP be checked again and it is 178/110. Spoke with Dr.David who asked was he symptomatic and the pt is not. She stated that they will address it in the morning. Will continue to monitor.

## 2015-04-03 NOTE — Progress Notes (Signed)
Paged Dr.David regarding Pt's BP. There are now new orders at this time. Will continue to monitor.

## 2015-04-03 NOTE — Progress Notes (Signed)
   04/03/15 1601  Vitals  BP (!) 166/105 mmHg  BP Location Left Arm  BP Method Automatic  Patient Position (if appropriate) Sitting  Pulse Rate 70  PRN Hydralazine 5MG  IV given at this time, will continue to monitor pt

## 2015-04-04 ENCOUNTER — Inpatient Hospital Stay (HOSPITAL_COMMUNITY): Payer: Medicaid Other

## 2015-04-04 DIAGNOSIS — R51 Headache: Secondary | ICD-10-CM

## 2015-04-04 DIAGNOSIS — R519 Headache, unspecified: Secondary | ICD-10-CM | POA: Insufficient documentation

## 2015-04-04 LAB — CBC
HCT: 32 % — ABNORMAL LOW (ref 39.0–52.0)
Hemoglobin: 10.7 g/dL — ABNORMAL LOW (ref 13.0–17.0)
MCH: 29.6 pg (ref 26.0–34.0)
MCHC: 33.4 g/dL (ref 30.0–36.0)
MCV: 88.6 fL (ref 78.0–100.0)
Platelets: 129 10*3/uL — ABNORMAL LOW (ref 150–400)
RBC: 3.61 MIL/uL — ABNORMAL LOW (ref 4.22–5.81)
RDW: 12.5 % (ref 11.5–15.5)
WBC: 4.4 10*3/uL (ref 4.0–10.5)

## 2015-04-04 LAB — BASIC METABOLIC PANEL
Anion gap: 10 (ref 5–15)
BUN: 64 mg/dL — ABNORMAL HIGH (ref 6–20)
CO2: 24 mmol/L (ref 22–32)
Calcium: 8.6 mg/dL — ABNORMAL LOW (ref 8.9–10.3)
Chloride: 101 mmol/L (ref 101–111)
Creatinine, Ser: 7.5 mg/dL — ABNORMAL HIGH (ref 0.61–1.24)
GFR, EST AFRICAN AMERICAN: 10 mL/min — AB (ref >60)
GFR, EST NON AFRICAN AMERICAN: 8 mL/min — AB (ref >60)
Glucose, Bld: 99 mg/dL (ref 65–99)
POTASSIUM: 4.5 mmol/L (ref 3.5–5.1)
SODIUM: 135 mmol/L (ref 135–145)

## 2015-04-04 LAB — HEPATITIS B SURFACE ANTIBODY, QUANTITATIVE

## 2015-04-04 MED ORDER — HYDRALAZINE HCL 25 MG PO TABS
50.0000 mg | ORAL_TABLET | Freq: Three times a day (TID) | ORAL | Status: DC
  Administered 2015-04-04 – 2015-04-07 (×11): 50 mg via ORAL
  Filled 2015-04-04 (×11): qty 2

## 2015-04-04 MED ORDER — NIFEDIPINE ER OSMOTIC RELEASE 30 MG PO TB24
90.0000 mg | ORAL_TABLET | Freq: Every day | ORAL | Status: DC
  Administered 2015-04-04 – 2015-04-07 (×4): 90 mg via ORAL
  Filled 2015-04-04 (×4): qty 3

## 2015-04-04 MED ORDER — SODIUM CHLORIDE 0.45 % IV SOLN
INTRAVENOUS | Status: DC
  Administered 2015-04-04 – 2015-04-06 (×7): via INTRAVENOUS

## 2015-04-04 MED ORDER — METOCLOPRAMIDE HCL 5 MG/ML IJ SOLN
10.0000 mg | Freq: Once | INTRAMUSCULAR | Status: AC
  Administered 2015-04-04: 10 mg via INTRAVENOUS
  Filled 2015-04-04: qty 2

## 2015-04-04 NOTE — Progress Notes (Signed)
Subjective: Patient  complains of some nausea and vomiting last night. He complains of on and off headache. Presently has some nausea but no vomiting.  Objective: Vital signs in last 24 hours: Temp:  [97.8 F (36.6 C)-98.6 F (37 C)] 98.4 F (36.9 C) (06/17 0437) Pulse Rate:  [70-92] 92 (06/17 0437) Resp:  [18-20] 18 (06/17 0437) BP: (151-182)/(89-116) 151/89 mmHg (06/17 0437) SpO2:  [98 %-100 %] 100 % (06/17 0437)  Intake/Output from previous day: 06/16 0701 - 06/17 0700 In: 821.3 [P.O.:240; I.V.:581.3] Out: 1 [Emesis/NG output:1] Intake/Output this shift:     Recent Labs  04/01/15 0847 04/02/15 0425 04/03/15 0604 04/04/15 0617  HGB 11.5* 10.9* 10.7* 10.7*    Recent Labs  04/03/15 0604 04/04/15 0617  WBC 4.1 4.4  RBC 3.57* 3.61*  HCT 31.2* 32.0*  PLT 142* 129*    Recent Labs  04/03/15 0604 04/04/15 0617  NA 136 135  K 3.4* 4.5  CL 97* 101  CO2 26 24  BUN 67* 64*  CREATININE 7.63* 7.50*  GLUCOSE 98 99  CALCIUM 8.1* 8.6*    Recent Labs  04/03/15 0604  INR 1.06    Generally patient is alert and in no apparent distress Chest is clear to auscultation His heart exam revealed regular rate and rhythm no murmur Extremities no edema.  Assessment/Plan: Problem #1 acute kidney injury superimposed on chronic: Most likely secondary to malignant hypertension but prerenal syndrome and other etiologies cannot be ruled out. Presently patient is nonoliguric and his BUN and creatinine is improving. Problem #2 hypertension: Possibly primary hypertension. Plasma ALDO/PRA ratio is 1.6 which is within normal range. His normetanephrine level is 151 slightly high. Metanephrine is normal. This is inconclusive. Problem #3 anemia: Patient with elevated LDH, low heptoglobin, thrombocytopenia. This could be also related to his malignant hypertension it was of other etiologies cannot be ruled out. Patient CPK is normal Problem #4 chronic renal failure: Most likely from  hypertension/hepatitis C infection/ Problem #5 proteinuria: Possibly a compression of hepatitis C infection and also hypertension. Patient with polyclonal gammopathy. Problem #6 secondary hyperparathyroidism: His PTH is 118. Problem #7 hypokalemia: Potassium is normal. Problem #8 metabolic bone disease: His calcium is range but his phosphorus is high. Plan: We'll increase his IV fluid to 135 mL per hour We'll DC amlodipine We'll use Procardia xl 90 mg po once a day We'll start patient on hydralazine 50 mg by mouth 3 times a day We'll check plasma catecholamine's and urinary fractionated metanephrines. If the urinary catecholamine's if he becomes borderline we may need to DC labetalol and recheck it. We'll check his basic metabolic panel in the morning     Terrance Reynolds S 04/04/2015, 7:47 AM

## 2015-04-04 NOTE — Care Management Note (Signed)
Case Management Note  Patient Details  Name: PRATHAM BEISSEL MRN: [id number] Date of Birth: 1980-10-26  Subjective/Objective:                  Pt admitted from home with HTN crisis and renal failure. Pt lives with his mother and will return home at discharge. Pt is independent with ADl's.  Action/Plan: PCP appt made and documented on AVs. Pt made aware as well. [place] voucher will be placed on shadow chart for discharge. No further CM needs noted.  Expected Discharge Date:                  Expected Discharge Plan:  Home/Self Care  In-House Referral:  Financial Counselor  Discharge planning Services  CM Consult, [facility], Follow-up appt scheduled  Post Acute Care Choice:  NA Choice offered to:  NA  DME Arranged:    DME Agency:     HH Arranged:    [place] Agency:     Status of Service:  Completed, signed off  Medicare Important Message Given:    Date Medicare IM Given:    Medicare IM give by:    Date Additional Medicare IM Given:    Additional Medicare Important Message give by:     If discussed at [place] of Stay Meetings, dates discussed:    Additional Comments:  Joylene Draft, RN 04/04/2015, 12:04 PM

## 2015-04-04 NOTE — Progress Notes (Signed)
Pt is complaining of a severe headache while vomiting. He is sitting on the side of the bed holding his head and groaning. I have checked his vitals and his BP IS 171/99, HR 93. I have paged [place] regarding this issue. Will continue to monitor.

## 2015-04-04 NOTE — Progress Notes (Signed)
[facility] PROGRESS NOTE  Terrance Reynolds [id number] DOB: 1981-09-17 DOA: 04/01/2015 PCP: No PCP Per Patient  Assessment/Plan: Hypertensive crisis: Likely related to noncompliance. Only fair control. meds changed to hydralazine and procardia and amlodipine discontinues. continue labetelol. Continue to monitor  Active Problems:   Acute on chronic renal failure; secondary to #1. Creatinine slightly improved. Evaluated by nephrology who opine if continues to worsen may need dialysis.   Elevated troponin: No chest pain. Likely related to above. No events on tele. 2-D echo with mild LVH and grade 2 diastolic dysfunction.   Leg edema: resolved. EKG with LVH. BNP 447. See above    Dyspnea: resolved.  Anemia: LDH elevated with low haptaglobin. 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 Treatment is to treating cause. Appreciate heme help.   Thrombocytopenia: related to #1. Trending down. See above.  No signs symptoms bleeding. Monitor.  Positive Hepatitis C antibody: evaluated by GI who opines stigmata of chronic liver disease. Upper abdominal US unremarkable. HCVRNA in process.  Hep B surface antigen <3.1.  Hx substance abuse: daily methadone at clinic. Methadone dose verified with clinic [phone]    Code Status: full Family Communication: none present Disposition Plan: home when ready   Consultants:  Nephrology  Hematology  gastroenterology  Procedures:  Echo: The cavity size was mildly dilated. Wall thickness was increased in a pattern of mild LVH. The estimated ejection fraction was 55%. Wall motion was normal; there were no regional wall motion abnormalities. Features are consistent with a pseudonormal left ventricular filling pattern, with concomitant abnormal relaxation and increased filling pressure (grade  2 diastolic dysfunction).  Antibiotics:  none  HPI/Subjective: Reports continued headache. No further nausea  Objective: Filed Vitals:   04/04/15 0830  BP: 144/89  Pulse: 76  Temp:   Resp: 18    Intake/Output Summary (Last 24 hours) at 04/04/15 1134 Last data filed at 04/04/15 0956  Gross per 24 hour  Intake 2258.33 ml  Output      1 ml  Net 2257.33 ml   Filed Weights   04/01/15 0814 04/02/15 0500  Weight: 86.183 kg (190 lb) 79.5 kg (175 lb 4.3 oz)    Exam:   General:  Well nourished appears well  Cardiovascular: RRR no MGR no LE edema  Respiratory: normal effort BS clear bilaterally  Abdomen: soft +BS non-tender  Musculoskeletal: no clubbing or cyanosis   Data Reviewed: Basic Metabolic Panel:  Recent Labs Lab 04/01/15 0847 04/02/15 0425 04/03/15 0604 04/04/15 0617  NA 137 138 136 135  K 3.9 3.6 3.4* 4.5  CL 95* 98* 97* 101  CO2 26 26 26 24   GLUCOSE 96 100* 98 99  BUN 58* 67* 67* 64*  CREATININE 6.83* 7.21* 7.63* 7.50*  CALCIUM 8.2* 8.0*  8.2* 8.1* 8.6*  PHOS  --  8.0*  --   --    Liver Function Tests:  Recent Labs Lab 04/01/15 0847 04/02/15 0425  AST 35 24  ALT 46 35  ALKPHOS 92 75  BILITOT 0.6 0.7  PROT 6.7 5.9*  ALBUMIN 3.4* 3.0*    Recent Labs Lab 04/01/15 0847  LIPASE 51   No results for input(s): AMMONIA in the last 168 hours. CBC:  Recent Labs Lab 04/01/15 0847 04/02/15 0425 04/03/15 0604 04/04/15 0617  WBC 4.4 5.4 4.1 4.4  NEUTROABS 2.6  --   --   --   HGB  11.5* 10.9* 10.7* 10.7*  HCT 32.7* 31.2* 31.2* 32.0*  MCV 86.5 87.2 87.4 88.6  PLT 145* 147* 142* 129*   Cardiac Enzymes:  Recent Labs Lab 04/01/15 0847 04/01/15 1702 04/01/15 2232  CKTOTAL  --  189  --   TROPONINI 0.04* 0.05* 0.05*   BNP (last 3 results)  Recent Labs  04/01/15 0847  BNP 447.0*    ProBNP (last 3 results) No results for input(s): PROBNP in the last 8760 hours.  CBG: No results for input(s): GLUCAP in the last 168  hours.  Recent Results (from the past 240 hour(s))  MRSA PCR Screening     Status: None   Collection Time: 04/01/15  6:30 PM  Result Value Ref Range Status   MRSA by PCR NEGATIVE NEGATIVE Final    Comment:        The GeneXpert MRSA Assay (FDA approved for NASAL specimens only), is one component of a comprehensive MRSA colonization surveillance program. It is not intended to diagnose MRSA infection nor to guide or monitor treatment for MRSA infections.      Studies: Ct Head Wo Contrast  04/04/2015   CLINICAL DATA:  Patient woke up with severe headache in elevated blood pressure.  EXAM: CT HEAD WITHOUT CONTRAST  TECHNIQUE: Contiguous axial images were obtained from the base of the skull through the vertex without intravenous contrast.  COMPARISON:  04/01/2015  FINDINGS: Intracranial contents are symmetrical. No ventricular dilatation. No mass effect or midline shift. No abnormal extra-axial fluid collections. Gray-white matter junctions are distinct. Basal cisterns are not effaced. No evidence of acute intracranial hemorrhage. No depressed skull fractures. Mucosal thickening in the paranasal sinuses. Mastoid air cells are not opacified.  IMPRESSION: No acute intracranial abnormalities.   Electronically Signed   By: Lucienne Capers M.D.   On: 04/04/2015 02:04   US Abdomen Complete  04/03/2015   CLINICAL DATA:  Hepatitis C  EXAM: ULTRASOUND ABDOMEN COMPLETE  COMPARISON:  None.  FINDINGS: Gallbladder: No gallstones, gallbladder wall thickening, or pericholecystic fluid. Negative sonographic Murphy's sign.  Common bile duct: Diameter: 5 mm  Liver: No focal lesion identified. Within normal limits in parenchymal echogenicity.  IVC: No abnormality visualized.  Pancreas: Visualized portion unremarkable. Two peripancreatic nodes measuring up to 13 mm short axis.  Spleen: Size and appearance within normal limits.  Right Kidney: Length: 11.0 cm. Echogenic renal parenchyma. No mass or hydronephrosis.   Left Kidney: Length: 11.1 cm. Echogenic renal parenchyma. No mass or hydronephrosis.  Abdominal aorta: No aneurysm visualized.  Other findings: None.  IMPRESSION: Echogenic renal parenchyma, suggesting medical renal disease. No hydronephrosis.  Otherwise negative abdominal ultrasound.   Electronically Signed   By: Julian Hy M.D.   On: 04/03/2015 11:52    Scheduled Meds: . calcium acetate  667 mg Oral TID WC  . enoxaparin (LOVENOX) injection  30 mg Subcutaneous Daily  . hydrALAZINE  50 mg Oral 3 times per day  . labetalol  300 mg Oral BID  . methadone  75 mg Oral Daily  . NIFEdipine  90 mg Oral Daily  . senna  1 tablet Oral BID  . sodium chloride  3 mL Intravenous Q12H   Continuous Infusions: . sodium chloride 135 mL/hr at 04/04/15 0827    Principal Problem:   Hypertensive crisis Active Problems:   Acute renal failure   Acute on chronic renal failure   Elevated troponin   Dyspnea   Leg edema   Anemia   Thrombocytopenia   Substance abuse  Hepatitis C    Time spent: 30 minutes    [facility] Pager [phone]. If 7PM-7AM, please contact night-coverage at [email or web address], password [facility] 04/04/2015, 11:34 AM  LOS: 3 days

## 2015-04-04 NOTE — Progress Notes (Signed)
Educated patient earlier urine needed to be collected for 24hr urine collection. Informed patient to notify staff each time he voids and do not discard his urine. Patient went to bathroom and did not inform staff. Patient re-educated regarding urine collection.

## 2015-04-05 DIAGNOSIS — D696 Thrombocytopenia, unspecified: Secondary | ICD-10-CM

## 2015-04-05 LAB — CBC
HEMATOCRIT: 31.6 % — AB (ref 39.0–52.0)
HEMOGLOBIN: 10.6 g/dL — AB (ref 13.0–17.0)
MCH: 29.8 pg (ref 26.0–34.0)
MCHC: 33.5 g/dL (ref 30.0–36.0)
MCV: 88.8 fL (ref 78.0–100.0)
Platelets: 126 10*3/uL — ABNORMAL LOW (ref 150–400)
RBC: 3.56 MIL/uL — ABNORMAL LOW (ref 4.22–5.81)
RDW: 12.5 % (ref 11.5–15.5)
WBC: 5.6 10*3/uL (ref 4.0–10.5)

## 2015-04-05 LAB — BASIC METABOLIC PANEL
ANION GAP: 10 (ref 5–15)
BUN: 61 mg/dL — ABNORMAL HIGH (ref 6–20)
CALCIUM: 8.5 mg/dL — AB (ref 8.9–10.3)
CHLORIDE: 101 mmol/L (ref 101–111)
CO2: 23 mmol/L (ref 22–32)
Creatinine, Ser: 6.86 mg/dL — ABNORMAL HIGH (ref 0.61–1.24)
GFR calc Af Amer: 11 mL/min — ABNORMAL LOW (ref >60)
GFR calc non Af Amer: 9 mL/min — ABNORMAL LOW (ref >60)
Glucose, Bld: 110 mg/dL — ABNORMAL HIGH (ref 65–99)
POTASSIUM: 4.4 mmol/L (ref 3.5–5.1)
SODIUM: 134 mmol/L — AB (ref 135–145)

## 2015-04-05 LAB — HCV RNA QUANT
HCV Quantitative Log: 2.886 log10 IU/mL (ref >1.70)
HCV Quantitative: 770 IU/mL (ref >50)

## 2015-04-05 MED ORDER — TRAMADOL HCL 50 MG PO TABS
50.0000 mg | ORAL_TABLET | Freq: Once | ORAL | Status: AC
  Administered 2015-04-05: 50 mg via ORAL
  Filled 2015-04-05: qty 1

## 2015-04-05 MED ORDER — PROMETHAZINE HCL 25 MG/ML IJ SOLN: 12.5000 mg | Freq: Four times a day (QID) | INTRAMUSCULAR | Status: DC | PRN

## 2015-04-05 MED ORDER — OXYCODONE-ACETAMINOPHEN 5-325 MG PO TABS
1.0000 | ORAL_TABLET | ORAL | Status: DC | PRN
  Administered 2015-04-05 – 2015-04-07 (×9): 2 via ORAL
  Filled 2015-04-05 (×9): qty 2

## 2015-04-05 NOTE — Progress Notes (Signed)
Subjective: Patient continued to complain frontal headache and not improving. He also states that she has some nausea and vomiting. At this moment he denies any difficulty breathing.  Objective: Vital signs in last 24 hours: Temp:  [97.9 F (36.6 C)-98 F (36.7 C)] 97.9 F (36.6 C) (06/18 0659) Pulse Rate:  [71-100] 83 (06/18 0659) Resp:  [18-20] 20 (06/18 0659) BP: (125-172)/(66-102) 155/87 mmHg (06/18 0659) SpO2:  [99 %-100 %] 99 % (06/18 0659)  Intake/Output from previous day: 06/17 0701 - 06/18 0700 In: 2853 [P.O.:360; I.V.:2493] Out: 1475 [Urine:1475] Intake/Output this shift:     Recent Labs  04/03/15 0604 04/04/15 0617 04/05/15 0624  HGB 10.7* 10.7* 10.6*    Recent Labs  04/04/15 0617 04/05/15 0624  WBC 4.4 5.6  RBC 3.61* 3.56*  HCT 32.0* 31.6*  PLT 129* 126*    Recent Labs  04/04/15 0617 04/05/15 0624  NA 135 134*  K 4.5 4.4  CL 101 101  CO2 24 23  BUN 64* 61*  CREATININE 7.50* 6.86*  GLUCOSE 99 110*  CALCIUM 8.6* 8.5*    Recent Labs  04/03/15 0604  INR 1.06    Generally patient is alert and in no apparent distress Chest is clear to auscultation His heart exam revealed regular rate and rhythm no murmur Extremities no edema.  Assessment/Plan: Problem #1 acute kidney injury superimposed on chronic: History of function continued to improve and presently he is nonoliguric. Problem #2 hypertension: Possibly primary hypertension. Plasma ALDO/PRA ratio is 1.6 which is within normal range. His normetanephrine level is 151 slightly high. Metanephrine is normal. This is inconclusive. Presently patient is on Procardia, hydralazine, labetalol and his blood pressure seems to be reasonably controlled. On the average systolic blood pressure seems to be ranging between 140 to 160. Presently is 155/87. Problem #3 anemia: His hemoglobin and hematocrit is stable Problem #4 chronic renal failure: Most likely from hypertension/hepatitis C infection/ Problem #5  proteinuria: Possibly a compression of hepatitis C infection and also hypertension. Patient with polyclonal gammopathy. Nonnephrotic range. Problem #6 secondary hyperparathyroidism: His PTH is 118. Problem #7 hypokalemia: Potassium is normal. Problem #8 metabolic bone disease: His calcium is range but his phosphorus is high. Patient presently on a binder. Problem #9 headache: Etiology at this moment but clear. Patient's CT scan didn't show any acute change. Not sure whether patient has other cause of headaches such as migraine. Plan: We'll continue his present management We'll check his basic metabolic panel in the morning     Shakea Isip S 04/05/2015, 8:47 AM

## 2015-04-05 NOTE — Progress Notes (Signed)
Patient sitting up on the side of bed. Nausea has improved. Patient continues to c/o headache medicated with po medication. 24hr urine collection completed.

## 2015-04-05 NOTE — Progress Notes (Signed)
[facility] PROGRESS NOTE  Terrance Reynolds [id number] DOB: [birth year] DOA: 04/01/2015 PCP: No PCP Per Patient  Assessment/Plan: Hypertensive crisis: Likely related to noncompliance. Appears to be improving. On labetolol, procardia and hydralazine. Continue to monitor  Active Problems:   Acute on chronic renal failure; secondary to #1. Creatinine has started to trend down. Nephrology following. Continue IV hydration and monitor urine output.   Elevated troponin: No chest pain. Likely related to above. No events on tele. 2-D echo with mild LVH and grade 2 diastolic dysfunction.   Leg edema: resolved. EKG with LVH. BNP 447. See above    Dyspnea: resolved.  Anemia: LDH elevated with low haptaglobin. 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 Treatment is to treat the cause. Appreciate heme assistance.   Thrombocytopenia: related to #1. Trending down. See above.  No signs symptoms bleeding. Monitor.  Positive Hepatitis C antibody: evaluated by GI who opines stigmata of chronic liver disease. Upper abdominal US unremarkable. HCVRNA is 770, genotype has been ordered.  Hep B surface antigen <3.1.  Hx substance abuse: daily methadone at clinic. Methadone dose verified with clinic [phone]).   Headache. Initially thought to be related to severe hypertension. May have element of migraine. Treat with pain medications.  Nausea and vomiting. Unclear etiology. Continue protonix and antiemetics. May need EGD   Code Status: full Family Communication: none present Disposition Plan: home when ready   Consultants:  Nephrology  Hematology  gastroenterology  Procedures:  Echo: The cavity size was mildly dilated. Wall thickness was increased in a pattern of mild LVH. The estimated ejection fraction was 55%. Wall motion was normal; there were  no regional wall motion abnormalities. Features are consistent with a pseudonormal left ventricular filling pattern, with concomitant abnormal relaxation and increased filling pressure (grade 2 diastolic dysfunction).  Antibiotics:  none  HPI/Subjective: Reports continued nausea and vomiting. Still has headache. No shortness of breath  Objective: Filed Vitals:   04/05/15 1456  BP: 144/95  Pulse: 89  Temp: 98.1 F (36.7 C)  Resp: 20    Intake/Output Summary (Last 24 hours) at 04/05/15 1532 Last data filed at 04/05/15 1500  Gross per 24 hour  Intake   3180 ml  Output   1475 ml  Net   1705 ml   Filed Weights   04/01/15 0814 04/02/15 0500  Weight: 86.183 kg (190 lb) 79.5 kg (175 lb 4.3 oz)    Exam:   General:  Laying in bed, does not appear to be in dsitress  Cardiovascular: RRR no MGR 1+ LE edema  Respiratory: CTA B  Abdomen: soft +BS non-tender  Musculoskeletal: no clubbing or cyanosis   Data Reviewed: Basic Metabolic Panel:  Recent Labs Lab 04/01/15 0847 04/02/15 0425 04/03/15 0604 04/04/15 0617 04/05/15 0624  NA 137 138 136 135 134*  K 3.9 3.6 3.4* 4.5 4.4  CL 95* 98* 97* 101 101  CO2 26 26 26 24 23   GLUCOSE 96 100* 98 99 110*  BUN 58* 67* 67* 64* 61*  CREATININE 6.83* 7.21* 7.63* 7.50* 6.86*  CALCIUM 8.2* 8.0*  8.2* 8.1* 8.6* 8.5*  PHOS  --  8.0*  --   --   --    Liver Function Tests:  Recent Labs Lab 04/01/15 0847 04/02/15 0425  AST 35 24  ALT 46 35  ALKPHOS 92 75  BILITOT 0.6 0.7  PROT 6.7 5.9*  ALBUMIN  3.4* 3.0*    Recent Labs Lab 04/01/15 0847  LIPASE 51   No results for input(s): AMMONIA in the last 168 hours. CBC:  Recent Labs Lab 04/01/15 0847 04/02/15 0425 04/03/15 0604 04/04/15 0617 04/05/15 0624  WBC 4.4 5.4 4.1 4.4 5.6  NEUTROABS 2.6  --   --   --   --   HGB 11.5* 10.9* 10.7* 10.7* 10.6*  HCT 32.7* 31.2* 31.2* 32.0* 31.6*  MCV 86.5 87.2 87.4 88.6 88.8  PLT 145* 147* 142* 129* 126*   Cardiac  Enzymes:  Recent Labs Lab 04/01/15 0847 04/01/15 1702 04/01/15 2232  CKTOTAL  --  189  --   TROPONINI 0.04* 0.05* 0.05*   BNP (last 3 results)  Recent Labs  04/01/15 0847  BNP 447.0*    ProBNP (last 3 results) No results for input(s): PROBNP in the last 8760 hours.  CBG: No results for input(s): GLUCAP in the last 168 hours.  Recent Results (from the past 240 hour(s))  MRSA PCR Screening     Status: None   Collection Time: 04/01/15  6:30 PM  Result Value Ref Range Status   MRSA by PCR NEGATIVE NEGATIVE Final    Comment:        The GeneXpert MRSA Assay (FDA approved for NASAL specimens only), is one component of a comprehensive MRSA colonization surveillance program. It is not intended to diagnose MRSA infection nor to guide or monitor treatment for MRSA infections.      Studies: Ct Head Wo Contrast  04/04/2015   CLINICAL DATA:  Patient woke up with severe headache in elevated blood pressure.  EXAM: CT HEAD WITHOUT CONTRAST  TECHNIQUE: Contiguous axial images were obtained from the base of the skull through the vertex without intravenous contrast.  COMPARISON:  04/01/2015  FINDINGS: Intracranial contents are symmetrical. No ventricular dilatation. No mass effect or midline shift. No abnormal extra-axial fluid collections. Gray-white matter junctions are distinct. Basal cisterns are not effaced. No evidence of acute intracranial hemorrhage. No depressed skull fractures. Mucosal thickening in the paranasal sinuses. Mastoid air cells are not opacified.  IMPRESSION: No acute intracranial abnormalities.   Electronically Signed   By: Lucienne Capers M.D.   On: 04/04/2015 02:04    Scheduled Meds: . calcium acetate  667 mg Oral TID WC  . enoxaparin (LOVENOX) injection  30 mg Subcutaneous Daily  . hydrALAZINE  50 mg Oral 3 times per day  . labetalol  300 mg Oral BID  . methadone  75 mg Oral Daily  . NIFEdipine  90 mg Oral Daily  . senna  1 tablet Oral BID  . sodium  chloride  3 mL Intravenous Q12H   Continuous Infusions: . sodium chloride 135 mL/hr at 04/05/15 1500    Principal Problem:   Hypertensive crisis Active Problems:   Acute renal failure   Acute on chronic renal failure   Elevated troponin   Dyspnea   Leg edema   Anemia   Thrombocytopenia   Substance abuse   Hepatitis C   Headache    Time spent: 30 minutes    [facility] Pager [phone]. If 7PM-7AM, please contact night-coverage at [email or web address], password [facility] 04/05/2015, 3:32 PM  LOS: 4 days

## 2015-04-05 NOTE — Progress Notes (Signed)
  Subjective:  Patient complains of headache with nausea and heaving. He did vomit earlier today and according to nursing staff was frothy clear liquid. He denies melena or rectal bleeding. He also denies abdominal pain.   Objective: Blood pressure 144/87, pulse 93, temperature 97.9 F (36.6 C), temperature source Oral, resp. rate 20, height 6\' 2"  (1.88 m), weight 175 lb 4.3 oz (79.5 kg), SpO2 99 %. Patient is resting since he received morphine sulfate for headache.  Labs/studies Results:   Recent Labs  04/03/15 0604 04/04/15 0617 04/05/15 0624  WBC 4.1 4.4 5.6  HGB 10.7* 10.7* 10.6*  HCT 31.2* 32.0* 31.6*  PLT 142* 129* 126*    BMET   Recent Labs  04/03/15 0604 04/04/15 0617 04/05/15 0624  NA 136 135 134*  K 3.4* 4.5 4.4  CL 97* 101 101  CO2 26 24 23   GLUCOSE 98 99 110*  BUN 67* 64* 61*  CREATININE 7.63* 7.50* 6.86*  CALCIUM 8.1* 8.6* 8.5*     HCVRNA by PCR positive at 770 IU/mL  Heme occult is pending.  Assessment:  #1. Chronic hepatitis C. CVR no is positive and confirms active infection. Transaminases are normal and ultrasound does not reveal stigmata of chronic liver disease. He will be genotyped. He would be candidate for anti-viral therapy down the road. #2. Mild thrombocytopenia. It does not appear to be secondary to chronic liver disease. #3. Renal failure. Non-oliguric renal failure. Serum creatinine is coming down. Dr.Befakadu is following patient. #4. Hypertension. Patient is on 3 antihypertensives and blood pressure reasonably well controlled although his diastolic has been just above 100 at times. #5. Anemia. No evidence of overt GI bleed or gross immature area. Heme occult is pending. #6. Nausea and vomiting seem to be triggered by headache. Unenhanced head CT yesterday negative for acute abnormalities. Ultrasound negative for cholelithiasis. Need to consider peptic ulcer disease and differential diagnosis since he has been taking aspirin when necessary  for headache prior to admission. #7. Headache. He remains with headache despite control of his hypertension. Head CT negative. Doubt that he is having withdrawal symptoms given that he is on methadone.   Recommendations;  HCV genotype. Pantoprazole 40 mg IV every 24 hours. If nausea and vomiting persist consider diagnostic EGD.

## 2015-04-05 NOTE — Progress Notes (Signed)
Educated pt on 24 hour urine collection and stool collection. Pt verbalized understanding and states he will notify staff when he urinates or passes stool.

## 2015-04-06 LAB — BASIC METABOLIC PANEL
ANION GAP: 10 (ref 5–15)
BUN: 60 mg/dL — ABNORMAL HIGH (ref 6–20)
CALCIUM: 8.3 mg/dL — AB (ref 8.9–10.3)
CHLORIDE: 99 mmol/L — AB (ref 101–111)
CO2: 22 mmol/L (ref 22–32)
CREATININE: 6.88 mg/dL — AB (ref 0.61–1.24)
GFR calc non Af Amer: 9 mL/min — ABNORMAL LOW (ref >60)
GFR, EST AFRICAN AMERICAN: 11 mL/min — AB (ref >60)
Glucose, Bld: 99 mg/dL (ref 65–99)
Potassium: 4 mmol/L (ref 3.5–5.1)
SODIUM: 131 mmol/L — AB (ref 135–145)

## 2015-04-06 LAB — PHOSPHORUS: Phosphorus: 7.4 mg/dL — ABNORMAL HIGH (ref 2.5–4.6)

## 2015-04-06 LAB — ALDOSTERONE + RENIN ACTIVITY W/ RATIO
ALDO / PRA RATIO: 0.8 (ref 0.0–30.0)
Aldosterone: 5.1 ng/dL (ref 0.0–30.0)
PRA LC/MS/MS: 6.31 ng/mL/hr

## 2015-04-06 MED ORDER — SODIUM CHLORIDE 0.9 % IV SOLN
INTRAVENOUS | Status: DC
  Administered 2015-04-06 – 2015-04-07 (×4): via INTRAVENOUS

## 2015-04-06 NOTE — Progress Notes (Signed)
[facility] PROGRESS NOTE  Terrance Reynolds [id number] DOB: 07/13/1981 DOA: 04/01/2015 PCP: No PCP Per Patient  Assessment/Plan: Hypertensive crisis: Likely related to noncompliance. Appears to be improving. On labetolol, procardia and hydralazine. Blood pressure is improved. Continue to monitor    Acute on chronic renal failure; secondary to #1. Creatinine unchanged today. Nephrology following. Continue IV hydration and monitor urine output. If creatinine stabilizes or start trending down, then he may be able to be followed as an outpatient.   Elevated troponin: No chest pain. Likely related to above. No events on tele. 2-D echo with mild LVH and grade 2 diastolic dysfunction.   Leg edema: resolved. EKG with LVH. BNP 447. See above    Dyspnea: resolved.  Anemia: LDH elevated with low haptaglobin. 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 Treatment is to treat the cause. Appreciate heme assistance.   Thrombocytopenia: related to #1. Trending down. See above.  No signs symptoms bleeding. Monitor.  Positive Hepatitis C antibody: evaluated by GI who opines no stigmata of chronic liver disease. Upper abdominal US unremarkable. HCVRNA is 770, genotype has been ordered.  Hep B surface antigen <3.1.  Hx substance abuse: daily methadone at clinic. Methadone dose verified with clinic [phone]).   Headache. Initially thought to be related to severe hypertension. May have element of migraine. Now resolved.  Nausea and vomiting. Related to headache. Now resolved.   Code Status: full Family Communication: none present Disposition Plan: home when ready   Consultants:  Nephrology  Hematology  gastroenterology  Procedures:  Echo: The cavity size was mildly dilated. Wall thickness was increased in a pattern of mild LVH. The  estimated ejection fraction was 55%. Wall motion was normal; there were no regional wall motion abnormalities. Features are consistent with a pseudonormal left ventricular filling pattern, with concomitant abnormal relaxation and increased filling pressure (grade 2 diastolic dysfunction).  Antibiotics:  none  HPI/Subjective: Feeling better today. Headache resolved after drinking coca cola. Feels that his headache may have been related to caffeine withdrawal. No further nausea or vomiting.  Objective: Filed Vitals:   04/06/15 0524  BP: 140/82  Pulse: 84  Temp: 98.7 F (37.1 C)  Resp: 16    Intake/Output Summary (Last 24 hours) at 04/06/15 1218 Last data filed at 04/06/15 [id number]  Gross per 24 hour  Intake 3026.25 ml  Output   1700 ml  Net 1326.25 ml   Filed Weights   04/01/15 0814 04/02/15 0500  Weight: 86.183 kg (190 lb) 79.5 kg (175 lb 4.3 oz)    Exam:   General:  Laying in bed, does not appear to be in dsitress  Cardiovascular: s1, s2 RRR no MGR 1+ LE edema  Respiratory: clear bilaterally  Abdomen: soft +BS non-tender  Musculoskeletal: no clubbing or cyanosis   Data Reviewed: Basic Metabolic Panel:  Recent Labs Lab 04/02/15 0425 04/03/15 0604 04/04/15 0617 04/05/15 0624 04/06/15 0617  NA 138 136 135 134* 131*  K 3.6 3.4* 4.5 4.4 4.0  CL 98* 97* 101 101 99*  CO2 26 26 24 23 22   GLUCOSE 100* 98 99 110* 99  BUN 67* 67* 64* 61* 60*  CREATININE 7.21* 7.63* 7.50* 6.86* 6.88*  CALCIUM 8.0*  8.2* 8.1* 8.6* 8.5* 8.3*  PHOS 8.0*  --   --   --  7.4*   Liver Function Tests:  Recent Labs Lab 04/01/15 0847 04/02/15 0425  AST 35 24  ALT 46 35  ALKPHOS 92 75  BILITOT 0.6 0.7  PROT 6.7 5.9*  ALBUMIN 3.4* 3.0*    Recent Labs Lab 04/01/15 0847  LIPASE 51   No results for input(s): AMMONIA in the last 168 hours. CBC:  Recent Labs Lab 04/01/15 0847 04/02/15 0425 04/03/15 0604 04/04/15 0617 04/05/15 0624  WBC 4.4 5.4 4.1 4.4 5.6   NEUTROABS 2.6  --   --   --   --   HGB 11.5* 10.9* 10.7* 10.7* 10.6*  HCT 32.7* 31.2* 31.2* 32.0* 31.6*  MCV 86.5 87.2 87.4 88.6 88.8  PLT 145* 147* 142* 129* 126*   Cardiac Enzymes:  Recent Labs Lab 04/01/15 0847 04/01/15 1702 04/01/15 2232  CKTOTAL  --  189  --   TROPONINI 0.04* 0.05* 0.05*   BNP (last 3 results)  Recent Labs  04/01/15 0847  BNP 447.0*    ProBNP (last 3 results) No results for input(s): PROBNP in the last 8760 hours.  CBG: No results for input(s): GLUCAP in the last 168 hours.  Recent Results (from the past 240 hour(s))  MRSA PCR Screening     Status: None   Collection Time: 04/01/15  6:30 PM  Result Value Ref Range Status   MRSA by PCR NEGATIVE NEGATIVE Final    Comment:        The GeneXpert MRSA Assay (FDA approved for NASAL specimens only), is one component of a comprehensive MRSA colonization surveillance program. It is not intended to diagnose MRSA infection nor to guide or monitor treatment for MRSA infections.      Studies: No results found.  Scheduled Meds: . calcium acetate  667 mg Oral TID WC  . enoxaparin (LOVENOX) injection  30 mg Subcutaneous Daily  . hydrALAZINE  50 mg Oral 3 times per day  . labetalol  300 mg Oral BID  . methadone  75 mg Oral Daily  . NIFEdipine  90 mg Oral Daily  . senna  1 tablet Oral BID  . sodium chloride  3 mL Intravenous Q12H   Continuous Infusions: . sodium chloride 150 mL/hr at 04/06/15 1004    Principal Problem:   Hypertensive crisis Active Problems:   Acute renal failure   Acute on chronic renal failure   Elevated troponin   Dyspnea   Leg edema   Anemia   Thrombocytopenia   Substance abuse   Hepatitis C   Headache    Time spent: 30 minutes    [facility] Pager [phone]. If 7PM-7AM, please contact night-coverage at [email or web address], password [facility] 04/06/2015, 12:18 PM  LOS: 5 days

## 2015-04-06 NOTE — Progress Notes (Signed)
Subjective: Patient feels much better and no headache today and he thinks possibly from caffeine withdrawal. No nausea or vomiting  Objective: Vital signs in last 24 hours: Temp:  [98.1 F (36.7 C)-98.7 F (37.1 C)] 98.7 F (37.1 C) (06/19 0524) Pulse Rate:  [83-93] 84 (06/19 0524) Resp:  [16-20] 16 (06/19 0524) BP: (140-145)/(82-95) 140/82 mmHg (06/19 0524) SpO2:  [97 %-100 %] 100 % (06/19 0524)  Intake/Output from previous day: 06/18 0701 - 06/19 0700 In: 3266.3 [P.O.:600; I.V.:2666.3] Out: 1700 [Urine:1700] Intake/Output this shift:     Recent Labs  04/04/15 0617 04/05/15 0624  HGB 10.7* 10.6*    Recent Labs  04/04/15 0617 04/05/15 0624  WBC 4.4 5.6  RBC 3.61* 3.56*  HCT 32.0* 31.6*  PLT 129* 126*    Recent Labs  04/05/15 0624 04/06/15 0617  NA 134* 131*  K 4.4 4.0  CL 101 99*  CO2 23 22  BUN 61* 60*  CREATININE 6.86* 6.88*  GLUCOSE 110* 99  CALCIUM 8.5* 8.3*   No results for input(s): LABPT, INR in the last 72 hours.  Generally patient is alert and in no apparent distress Chest is clear to auscultation His heart exam revealed regular rate and rhythm no murmur Extremities no edema.  Assessment/Plan: Problem #1 acute kidney injury superimposed on chronic: Possibly from malignant hypertension . Renal function does not show any change from yesterday. Patient oliguric Problem #2 hypertension: Most likely primary and long standing. His blood pressure is reasonably controlled Problem #3 anemia: Possibly hemolytic anemia. His hemoglobin is stable Problem #4 chronic renal failure: Most likely from hypertension/hepatitis C infection/ Problem #5 proteinuria: Possibly from of hepatitis C infection and also hypertension. Patient with polyclonal gammopathy. Nonnephrotic range. Problem #6 secondary hyperparathyroidism: His PTH is 118. Problem #7 hypokalemia: Potassium is normal. Problem #8 metabolic bone disease: His calcium is range but his phosphorus is high  but improving. Presently on a binder Problem #9 headache: Possible caffeine withdrawal and much better Plan:1] No need for dialysis  Change is ivf to ns at 140 cc/hr because of his hyponatrmia We'll check his basic metabolic panel in the morning     [facility] S 04/06/2015, 8:38 AM

## 2015-04-06 NOTE — Progress Notes (Signed)
  Subjective:  Patient has no complaints. He denies headache nausea and/or vomiting. He also denies abdominal pain melena or rectal bleeding. He states he ate all of his breakfast without difficulty. He does not want to undergo EGD unless nausea and vomiting relapse.  Objective: BP 140/82 mmHg  Pulse 84  Temp(Src) 98.7 F (37.1 C) (Oral)  Resp 16  Ht 6\' 2"  (1.88 m)  Wt 175 lb 4.3 oz (79.5 kg)  BMI 22.49 kg/m2  SpO2 100% Patient is alert and in no acute distress. Abdominal exam reveals symmetrical soft abdomen without tenderness organomegaly.   Labs/studies Results:   Recent Labs  04/04/15 0617 04/05/15 0624  WBC 4.4 5.6  HGB 10.7* 10.6*  HCT 32.0* 31.6*  PLT 129* 126*    BMET   Recent Labs  04/04/15 0617 04/05/15 0624 04/06/15 0617  NA 135 134* 131*  K 4.5 4.4 4.0  CL 101 101 99*  CO2 24 23 22   GLUCOSE 99 110* 99  BUN 64* 61* 60*  CREATININE 7.50* 6.86* 6.88*  CALCIUM 8.6* 8.5* 8.3*     HCVRNA genotype is pending.  Heme occult is pending.  Assessment:  #1. Chronic hepatitis C. Transaminases are normal. No evidence of chronic liver disease or portal hypertension based on clinical exam and imaging. He has low HCVRNA titer. He is a candidate for anti-viral therapy when acute issues have resolved or stabilized. Genotype is pending #2. Mild thrombocytopenia does not appear to be secondary to liver disease. #3. Renal failure. GFR is 9 and unchanged from yesterday. #4. Hypertension reasonably well controlled with therapy. #5. Anemia. Heme occult is pending. No evidence of overt GI bleed. #6. Nausea and vomiting has resolved. Patient will consider EGD if symptoms relapse. #7. Headache has resolved..   Recommendations;  Change pantoprazole to by mouth every morning LFTs with a.m. Lab.

## 2015-04-07 LAB — CATECHOLAMINES, FRACTIONATED, PLASMA
DOPAMINE: 288 pg/mL — AB (ref 0–48)
NOREPINEPHRINE: 1486 pg/mL — AB (ref 0–874)

## 2015-04-07 LAB — BASIC METABOLIC PANEL
Anion gap: 10 (ref 5–15)
BUN: 59 mg/dL — ABNORMAL HIGH (ref 6–20)
CALCIUM: 8.3 mg/dL — AB (ref 8.9–10.3)
CHLORIDE: 104 mmol/L (ref 101–111)
CO2: 22 mmol/L (ref 22–32)
Creatinine, Ser: 6.73 mg/dL — ABNORMAL HIGH (ref 0.61–1.24)
GFR calc Af Amer: 11 mL/min — ABNORMAL LOW (ref >60)
GFR, EST NON AFRICAN AMERICAN: 10 mL/min — AB (ref >60)
Glucose, Bld: 98 mg/dL (ref 65–99)
Potassium: 3.8 mmol/L (ref 3.5–5.1)
SODIUM: 136 mmol/L (ref 135–145)

## 2015-04-07 LAB — HEPATIC FUNCTION PANEL
ALK PHOS: 66 U/L (ref 38–126)
ALT: 38 U/L (ref 17–63)
AST: 25 U/L (ref 15–41)
Albumin: 3.1 g/dL — ABNORMAL LOW (ref 3.5–5.0)
Bilirubin, Direct: <0.1 mg/dL — ABNORMAL LOW (ref 0.1–0.5)
Total Bilirubin: 0.5 mg/dL (ref 0.3–1.2)
Total Protein: 6.1 g/dL — ABNORMAL LOW (ref 6.5–8.1)

## 2015-04-07 LAB — CRYOGLOBULIN

## 2015-04-07 LAB — ALDOSTERONE, URINE, 24 HOUR
Aldosterone, 24H Ur: 5.2 mcg/24 h
Creatinine, Urine mg/day-ALDU: 1.39 g/(24.h) (ref 0.63–2.50)
Volume, Urine-ALDU: 1600 mL

## 2015-04-07 MED ORDER — HYDRALAZINE HCL 50 MG PO TABS: 50.0000 mg | ORAL_TABLET | Freq: Three times a day (TID) | ORAL | Status: DC

## 2015-04-07 MED ORDER — NIFEDIPINE ER OSMOTIC RELEASE 90 MG PO TB24
90.0000 mg | ORAL_TABLET | Freq: Every day | ORAL | Status: DC
Start: 2015-04-07 — End: 2015-04-17

## 2015-04-07 MED ORDER — CALCIUM ACETATE (PHOS BINDER) 667 MG PO CAPS
667.0000 mg | ORAL_CAPSULE | Freq: Three times a day (TID) | ORAL | Status: DC
Start: 2015-04-07 — End: 2015-06-18

## 2015-04-07 MED ORDER — LABETALOL HCL 300 MG PO TABS: 300.0000 mg | ORAL_TABLET | Freq: Two times a day (BID) | ORAL | Status: DC

## 2015-04-07 NOTE — Progress Notes (Deleted)
Physician Discharge Summary  Terrance Reynolds [id number] DOB: 1981-03-28 DOA: 04/01/2015  PCP: No PCP Per Patient  Admit date: 04/01/2015 Discharge date: 04/07/2015  Time spent: 40 minutes  Recommendations for Outpatient Follow-up:   1. Lab work in 3 days to be followed by Dr Hinda Lenis for evaluation of kidney failure 2. [facility] 04/16/15 for evaluation of BP control, thrombocytopenia and Hep C  Discharge Diagnoses:  Principal Problem:   Hypertensive crisis Active Problems:   Acute renal failure   Acute on chronic renal failure   Elevated troponin   Dyspnea   Leg edema   Anemia   Thrombocytopenia   Substance abuse   Hepatitis C   Headache   Discharge Condition: stable  Diet recommendation: heart healthy  Filed Weights   04/01/15 0814 04/02/15 0500  Weight: 86.183 kg (190 lb) 79.5 kg (175 lb 4.3 oz)    History of present illness:  Terrance Reynolds is a 34 y.o. male with past medical hx of HTN, CKD and non-compliance presented to ED on 6/15=4/16 with chief complaint of headache and nausea and vomiting. Initial evaluation revealed hypertensive crisis, worsening renal failure, elevated troponin and abnormal EKG.  Patient statef 2 day history of nausea vomiting and decreased by mouth intake. He reportrf he haf not had his blood pressure medicine for about a month. He reported not having a PCP and claimed he recieves anti-hypertensive medication from the emergency department. Patient works outside and reported he thought he was possibly dehydrated but felt no better after drinking water. He had 2 episodes of emesis. Reported no coffee ground emesis. Denied chest pain palpitation but did endorse mild shortness of breath since he came to the emergency department. He denied fever chills cough visual disturbances numbness tingling of extremities. He denied abdominal pain diarrhea constipation.  Workup in the emergency department revealed hemoglobin of 11.5,  platelets of 145 chloride 95 BUN 58 creatinine 6.83 calcium 8.2, troponin 0.04. EKG RSR' in V1 or V2, probably normal variant LVH with secondary repolarization abnormality Borderline prolonged QT interval Baseline wander in lead(s) V3 V4. Urinalysis moderate hemoglobin.  In the emergency department blood pressure was 184/134 with a heart rate of 86 he was afebrile not hypoxic. He was given IV fluids and 1 dose of hydralazine as well as Zofran.  Hospital Course:  Hypertensive crisis: Likely related to noncompliance. Improved gradually. Will be discharged with  labetolol, procardia and hydralazine. Follow up with PCP 04/16/15 as noted above.     Acute on chronic renal failure; secondary to #1. Creatinine 6.73 at discharge. Evaluated by nephrology who opine renal function stable at discharge. Potassium within limits of normal. Urine output good. Will have BMET in 3 days to be followed by Dr Hinda Lenis to evaluate renal function and possible need for dialysis   Elevated troponin: No chest pain. Likely related to above. No events on tele. 2-D echo with mild LVH and grade 2 diastolic dysfunction.   Leg edema: resolved. EKG with LVH. BNP 447. See above    Dyspnea: resolved.  Anemia: LDH elevated with low haptaglobin. 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 Follow up with PCP   Thrombocytopenia: related to #1. Follow up with PCP  Positive Hepatitis C antibody: evaluated by GI who opines no stigmata of chronic liver disease. Upper abdominal US unremarkable. HCVRNA is 770, genotype has been ordered. Hep B surface antigen <  3.1.  Hx substance abuse: daily methadone at clinic. Methadone dose verified with clinic [phone]).   Headache. Initially thought to be related to severe hypertension. May have element of migraine. resolved.  Nausea and vomiting. Related to  headache. Rsolved.    Procedures:  Echo: The cavity size was mildly dilated. Wall thickness was increased in a pattern of mild LVH. The estimated ejection fraction was 55%. Wall motion was normal; there were no regional wall motion abnormalities. Features are consistent with a pseudonormal left ventricular filling pattern, with concomitant abnormal relaxation and increased filling pressure (grade 2 diastolic dysfunction).  Consultations:  Nephrology  Hematology  gastroenterology   Discharge Exam: Filed Vitals:   04/07/15 0516  BP: 153/88  Pulse: 95  Temp: 98.1 F (36.7 C)  Resp: 16    General: well nourished appears well Cardiovascular: RRR no m/g/r trace LE edema Respiratory: normal effort BS clear bilaterally  Discharge Instructions   Discharge Instructions    Diet - low sodium heart healthy    Complete by:  As directed      Increase activity slowly    Complete by:  As directed           Current Discharge Medication List    START taking these medications   Details  hydrALAZINE (APRESOLINE) 50 MG tablet Take 1 tablet (50 mg total) by mouth every 8 (eight) hours. Qty: 90 tablet, Refills: 1    labetalol (NORMODYNE) 300 MG tablet Take 1 tablet (300 mg total) by mouth 2 (two) times daily. Qty: 60 tablet, Refills: 1    NIFEdipine (PROCARDIA XL/ADALAT-CC) 90 MG 24 hr tablet Take 1 tablet (90 mg total) by mouth daily. Qty: 60 tablet, Refills: 0      CONTINUE these medications which have NOT CHANGED   Details  aspirin 325 MG tablet Take 325-650 mg by mouth daily as needed for headache.    Multiple Vitamin (MULTIVITAMIN WITH MINERALS) TABS tablet Take 1 tablet by mouth daily.       Allergies  Allergen Reactions  . Penicillins Other (See Comments)    Child hood allergy   Follow-up Information    Follow up with [facility] On 04/16/2015.   Why:  at 9:00   Contact information:   [address] [place]  [address] [phone]       Follow up with [facility] S, MD. Call in 3 days.   Specialty:  Nephrology   Why:  to do blood work to check his renal function   Contact information:   [address] [place] [address] [phone]        The results of significant diagnostics from this hospitalization (including imaging, microbiology, ancillary and laboratory) are listed below for reference.    Significant Diagnostic Studies: X-ray Chest Pa And Lateral  04/01/2015   CLINICAL DATA:  One day history of shortness of breath  EXAM: CHEST  2 VIEW  COMPARISON:  None.  FINDINGS: There is no edema or consolidation. The heart size and pulmonary vascularity are normal. No adenopathy. No bone lesions.  IMPRESSION: No edema or consolidation.   Electronically Signed   By: Lowella Grip III M.D.   On: 04/01/2015 15:25   Ct Head Wo Contrast  04/04/2015   CLINICAL DATA:  Patient woke up with severe headache in elevated blood pressure.  EXAM: CT HEAD WITHOUT CONTRAST  TECHNIQUE: Contiguous axial images were obtained from the base of the skull through the vertex without intravenous contrast.  COMPARISON:  04/01/2015  FINDINGS: Intracranial contents are symmetrical. No ventricular dilatation. No mass effect or midline shift. No abnormal extra-axial fluid collections. Gray-white matter junctions are distinct. Basal cisterns are not effaced. No evidence of acute intracranial hemorrhage. No depressed skull fractures. Mucosal thickening in the paranasal sinuses. Mastoid air cells are not opacified.  IMPRESSION: No acute intracranial abnormalities.   Electronically Signed   By: Lucienne Capers M.D.   On: 04/04/2015 02:04   Ct Head Wo Contrast  04/01/2015   CLINICAL DATA:  Headache for 2 days with nausea and vomiting and blurred vision.  EXAM: CT HEAD WITHOUT CONTRAST  TECHNIQUE: Contiguous axial images were obtained from the base of the skull through the vertex without intravenous contrast.   COMPARISON:  12/25/2014  FINDINGS: No mass lesion. No midline shift. No acute hemorrhage or hematoma. No extra-axial fluid collections. No evidence of acute infarction. Brain parenchyma is normal. Osseous structures are normal.  IMPRESSION: Normal exam.   Electronically Signed   By: Lorriane Shire M.D.   On: 04/01/2015 13:57   US Abdomen Complete  04/03/2015   CLINICAL DATA:  Hepatitis C  EXAM: ULTRASOUND ABDOMEN COMPLETE  COMPARISON:  None.  FINDINGS: Gallbladder: No gallstones, gallbladder wall thickening, or pericholecystic fluid. Negative sonographic Murphy's sign.  Common bile duct: Diameter: 5 mm  Liver: No focal lesion identified. Within normal limits in parenchymal echogenicity.  IVC: No abnormality visualized.  Pancreas: Visualized portion unremarkable. Two peripancreatic nodes measuring up to 13 mm short axis.  Spleen: Size and appearance within normal limits.  Right Kidney: Length: 11.0 cm. Echogenic renal parenchyma. No mass or hydronephrosis.  Left Kidney: Length: 11.1 cm. Echogenic renal parenchyma. No mass or hydronephrosis.  Abdominal aorta: No aneurysm visualized.  Other findings: None.  IMPRESSION: Echogenic renal parenchyma, suggesting medical renal disease. No hydronephrosis.  Otherwise negative abdominal ultrasound.   Electronically Signed   By: Julian Hy M.D.   On: 04/03/2015 11:52   US Renal  04/01/2015   CLINICAL DATA:  Increased creatinine  EXAM: RENAL / URINARY TRACT ULTRASOUND COMPLETE  COMPARISON:  None.  FINDINGS: Right Kidney:  Length: 11 cm.  Echogenic cortex.  No hydronephrosis or mass lesion  Left Kidney:  Length: 11 cm.  Echogenic cortex.  No hydronephrosis or mass lesion  Bladder:  Appears normal for degree of bladder distention. Patent bilateral ureters with jets.  IMPRESSION: 1. Medical renal disease without atrophy. 2. No hydronephrosis.   Electronically Signed   By: Monte Fantasia M.D.   On: 04/01/2015 16:13    Microbiology: Recent Results (from the past 240  hour(s))  MRSA PCR Screening     Status: None   Collection Time: 04/01/15  6:30 PM  Result Value Ref Range Status   MRSA by PCR NEGATIVE NEGATIVE Final    Comment:        The GeneXpert MRSA Assay (FDA approved for NASAL specimens only), is one component of a comprehensive MRSA colonization surveillance program. It is not intended to diagnose MRSA infection nor to guide or monitor treatment for MRSA infections.      Labs: Basic Metabolic Panel:  Recent Labs Lab 04/02/15 0425 04/03/15 0604 04/04/15 0617 04/05/15 [id number] 04/06/15 0617 04/07/15 0616  NA 138 136 135 134* 131* 136  K 3.6 3.4* 4.5 4.4 4.0 3.8  CL 98* 97* 101 101 99* 104  CO2 26 26 24 23 22 22   GLUCOSE 100* 98 99 110* 99 98  BUN 67* 67* 64* 61* 60* 59*  CREATININE 7.21* 7.63* 7.50* 6.86* 6.88* 6.73*  CALCIUM 8.0*  8.2* 8.1* 8.6* 8.5* 8.3* 8.3*  PHOS 8.0*  --   --   --  7.4*  --    Liver Function Tests:  Recent Labs Lab 04/01/15 0847 04/02/15 0425 04/07/15 0616  AST 35 24 25  ALT 46 35 38  ALKPHOS 92 75 66  BILITOT 0.6 0.7 0.5  PROT 6.7 5.9* 6.1*  ALBUMIN 3.4* 3.0* 3.1*    Recent Labs Lab 04/01/15 0847  LIPASE 51   No results for input(s): AMMONIA in the last 168 hours. CBC:  Recent Labs Lab 04/01/15 0847 04/02/15 0425 04/03/15 0604 04/04/15 0617 04/05/15 0624  WBC 4.4 5.4 4.1 4.4 5.6  NEUTROABS 2.6  --   --   --   --   HGB 11.5* 10.9* 10.7* 10.7* 10.6*  HCT 32.7* 31.2* 31.2* 32.0* 31.6*  MCV 86.5 87.2 87.4 88.6 88.8  PLT 145* 147* 142* 129* 126*   Cardiac Enzymes:  Recent Labs Lab 04/01/15 0847 04/01/15 1702 04/01/15 2232  CKTOTAL  --  189  --   TROPONINI 0.04* 0.05* 0.05*   BNP: BNP (last 3 results)  Recent Labs  04/01/15 0847  BNP 447.0*    ProBNP (last 3 results) No results for input(s): PROBNP in the last 8760 hours.  CBG: No results for input(s): GLUCAP in the last 168 hours.     SignedRadene Gunning  [facility] 04/07/2015, 11:34  AM

## 2015-04-07 NOTE — Progress Notes (Signed)
Subjective: Patient feels much better and no complaint. No difficulty in breathing  Objective: Vital signs in last 24 hours: Temp:  [98.1 F (36.7 C)-98.4 F (36.9 C)] 98.1 F (36.7 C) (06/20 0516) Pulse Rate:  [88-95] 95 (06/20 0516) Resp:  [16] 16 (06/20 0516) BP: (145-166)/(80-97) 153/88 mmHg (06/20 0516) SpO2:  [97 %-98 %] 97 % (06/20 0516)  Intake/Output from previous day: 06/19 0701 - 06/20 0700 In: 4137.5 [P.O.:1200; I.V.:2937.5] Out: 2350 [Urine:2350] Intake/Output this shift:     Recent Labs  04/05/15 0624  HGB 10.6*    Recent Labs  04/05/15 0624  WBC 5.6  RBC 3.56*  HCT 31.6*  PLT 126*    Recent Labs  04/06/15 0617 04/07/15 0616  NA 131* 136  K 4.0 3.8  CL 99* 104  CO2 22 22  BUN 60* 59*  CREATININE 6.88* 6.73*  GLUCOSE 99 98  CALCIUM 8.3* 8.3*   No results for input(s): LABPT, INR in the last 72 hours.  Generally patient is alert and in no apparent distress Chest is clear to auscultation His heart exam revealed regular rate and rhythm no murmur Extremities no edema.  Assessment/Plan: Problem #1 acute kidney injury superimposed on chronic: His renal function shows slight improvement but over all stable. His potassium is good and no uremic sign and symptoms. His urin out put has improved and had 2300 cc over the last 24 hours. Problem #2 hypertension: Most likely primary and long standing. His blood pressure is reasonably controlled Problem #3 anemia: Possibly hemolytic anemia. His hemoglobin is stable Problem #4 chronic renal failure: Most likely from hypertension/hepatitis C infection/ Problem #5 proteinuria: Possibly from of hepatitis C infection and also hypertension. Patient with polyclonal gammopathy. Nonnephrotic range. Problem #6 secondary hyperparathyroidism: secondary to chronic renal failure Problem #7 hypokalemia: Potassium is normal. Problem #8 metabolic bone disease: His calcium is range but his phosphorus is high but improving.  Presently on a binder Problem #9 headache: Possible caffeine withdrawal and much better Plan:1] No need for dialysis 2] Ok to discharge patient  3] Check BMP in 3 days . On Thursday 04/10/15 if  His renal function remains stable or shows sign of improvement we will continue to follow as out patient. If his renal function how ever declines further or become symptomatic we will bring patient back to the hospital and initiate dialysis.     Giannina Bartolome S 04/07/2015, 8:33 AM

## 2015-04-07 NOTE — Care Management Note (Signed)
Case Management Note  Patient Details  Name: Terrance Reynolds MRN: [id number] Date of Birth: 10/14/1981  Subjective/Objective:                    Action/Plan:   Expected Discharge Date:                  Expected Discharge Plan:  Home/Self Care  In-House Referral:  Financial Counselor  Discharge planning Services  CM Consult, [place] [facility], Follow-up appt scheduled  Post Acute Care Choice:  NA Choice offered to:  NA  DME Arranged:    DME Agency:     HH Arranged:    [place] Agency:     Status of Service:  Completed, signed off  Medicare Important Message Given:    Date Medicare IM Given:    Medicare IM give by:    Date Additional Medicare IM Given:    Additional Medicare Important Message give by:     If discussed at [place] of Stay Meetings, dates discussed:    Additional Comments: Pt discharged home today. Pt given MATCH voucher for medication assistance. Pt also instructed on when and where to get lab work done on 04/10/15 at [facility]. Pt and pts nurse aware of discharge arrangements. Christinia Gully [place], RN 04/07/2015, 12:45 PM

## 2015-04-07 NOTE — Progress Notes (Addendum)
Patient discharged with instructions, prescription, and care notes.  Verbalized understanding via teach back.  IV was removed and the site was WNL. Patient voiced no further complaints or concerns at the time of discharge.  Appointments scheduled per instructions.  Patient left the floor via ambulate with staff and family in stable condition.   The patient also received his medication voucher and letter for the [facility]

## 2015-04-08 NOTE — Discharge Summary (Deleted)
Physician Discharge Summary  Terrance Reynolds [id number] DOB: [birth year] DOA: 04/01/2015  PCP: No PCP Per Patient  Admit date: 04/01/2015 Discharge date: 04/08/2015  Time spent: 40 minutes  Recommendations for Outpatient Follow-up:   Lab work in 3 days to be followed by Dr Hinda Lenis for evaluation of kidney failure 1. [facility] 04/16/15 for evaluation of BP control, thrombocytopenia and Hep C  Discharge Diagnoses:  Principal Problem:   Hypertensive crisis Active Problems:   Acute renal failure   Acute on chronic renal failure   Elevated troponin   Dyspnea   Leg edema   Anemia   Thrombocytopenia   Substance abuse   Hepatitis C   Headache   Discharge Condition: stable  Diet recommendation: heart healthy  Filed Weights   04/01/15 0814 04/02/15 0500  Weight: 86.183 kg (190 lb) 79.5 kg (175 lb 4.3 oz)    History of present illness:  Terrance Reynolds is a 34 y.o. male with past medical hx of HTN, CKD and non-compliance presented to ED on 6/15=4/16 with chief complaint of headache and nausea and vomiting. Initial evaluation revealed hypertensive crisis, worsening renal failure, elevated troponin and abnormal EKG.  Patient statef 2 day history of nausea vomiting and decreased by mouth intake. He reportrf he haf not had his blood pressure medicine for about a month. He reported not having a PCP and claimed he recieves anti-hypertensive medication from the emergency department. Patient works outside and reported he thought he was possibly dehydrated but felt no better after drinking water. He had 2 episodes of emesis. Reported no coffee ground emesis. Denied chest pain palpitation but did endorse mild shortness of breath since he came to the emergency department. He denied fever chills cough visual disturbances numbness tingling of extremities. He denied abdominal pain diarrhea constipation.  Workup in the emergency department revealed hemoglobin of 11.5,  platelets of 145 chloride 95 BUN 58 creatinine 6.83 calcium 8.2, troponin 0.04. EKG RSR' in V1 or V2, probably normal variant LVH with secondary repolarization abnormality Borderline prolonged QT interval Baseline wander in lead(s) V3 V4. Urinalysis moderate hemoglobin.  In the emergency department blood pressure was 184/134 with a heart rate of 86 he was afebrile not hypoxic. He was given IV fluids and 1 dose of hydralazine as well as Zofran.   Hospital Course:  Hypertensive crisis: Likely related to noncompliance. Improved gradually. Will be discharged with labetolol, procardia and hydralazine. Follow up with PCP 04/16/15 as noted above.    Acute on chronic renal failure; secondary to #1. Creatinine 6.73 at discharge. Evaluated by nephrology who opine renal function stable at discharge. Potassium within limits of normal. Urine output good. Will have BMET in 3 days to be followed by Dr Hinda Lenis to evaluate renal function and possible need for dialysis   Elevated troponin: No chest pain. Likely related to above. No events on tele. 2-D echo with mild LVH and grade 2 diastolic dysfunction.   Leg edema: resolved. EKG with LVH. BNP 447. See above    Dyspnea: resolved.  Anemia: LDH elevated with low haptaglobin. 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 Follow up with PCP   Thrombocytopenia: related to #1. Follow up with PCP  Positive Hepatitis C antibody: evaluated by GI who opines no stigmata of chronic liver disease. Upper abdominal US unremarkable. HCVRNA is 770, genotype has been ordered. Hep B surface antigen <3.1.  Hx substance abuse: daily methadone at clinic. Methadone dose verified with clinic [phone]).   Headache. Initially thought to be related to severe hypertension. May have element of migraine. resolved.  Nausea and vomiting. Related to  headache. Rsolved.    Procedures:  Echo: The cavity size was mildly dilated. Wall thickness was increased in a pattern of mild LVH. The estimated ejection fraction was 55%. Wall motion was normal; there were no regional wall motion abnormalities. Features are consistent with a pseudonormal left ventricular filling pattern, with concomitant abnormal relaxation and increased filling pressure (grade 2 diastolic dysfunction).  Consultations:  Nephrology  Hematology  gastroenterology    Discharge Exam: Filed Vitals:   04/07/15 1301  BP: 158/97  Pulse: 73  Temp:   Resp:     General: well nourished appears well Cardiovascular: RRR no m/g/r trace LE edema Respiratory: normal effort BS clear bilaterally   Discharge Instructions   Discharge Instructions    Diet - low sodium heart healthy    Complete by:  As directed      Increase activity slowly    Complete by:  As directed           Discharge Medication List as of 04/07/2015 12:27 PM    START taking these medications   Details  hydrALAZINE (APRESOLINE) 50 MG tablet Take 1 tablet (50 mg total) by mouth every 8 (eight) hours., Starting 04/07/2015, Until Discontinued, Print    labetalol (NORMODYNE) 300 MG tablet Take 1 tablet (300 mg total) by mouth 2 (two) times daily., Starting 04/07/2015, Until Discontinued, Print    NIFEdipine (PROCARDIA XL/ADALAT-CC) 90 MG 24 hr tablet Take 1 tablet (90 mg total) by mouth daily., Starting 04/07/2015, Until Discontinued, Print      CONTINUE these medications which have NOT CHANGED   Details  aspirin 325 MG tablet Take 325-650 mg by mouth daily as needed for headache., Until Discontinued, Historical Med    Multiple Vitamin (MULTIVITAMIN WITH MINERALS) TABS tablet Take 1 tablet by mouth daily., Until Discontinued, Historical Med       Allergies  Allergen Reactions  . Penicillins Other (See Comments)    Child hood allergy   Follow-up Information    Follow up with  [facility] On 04/16/2015.   Why:  at 9:00   Contact information:   [address] [place] [address] [phone]       Follow up with [facility], MD. Call in 3 days.   Specialty:  Nephrology   Why:  to do blood work to check his renal function   Contact information:   [address] [address] [phone]       Follow up with REHMAN,NAJEEB U, MD. Schedule an appointment as soon as possible for a visit in 2 weeks.   Specialty:  Gastroenterology   Contact information:   [address], [address] [place] [address] [phone]        The results of significant diagnostics from this hospitalization (including imaging, microbiology, ancillary and laboratory) are listed below for reference.    Significant Diagnostic Studies: X-ray Chest Pa And Lateral  04/01/2015   CLINICAL DATA:  One day history of shortness of breath  EXAM: CHEST  2 VIEW  COMPARISON:  None.  FINDINGS: There is no edema or consolidation. The heart size and pulmonary vascularity are normal. No adenopathy. No bone lesions.  IMPRESSION: No edema or consolidation.   Electronically Signed   By: Lowella Grip  III M.D.   On: 04/01/2015 15:25   Ct Head Wo Contrast  04/04/2015   CLINICAL DATA:  Patient woke up with severe headache in elevated blood pressure.  EXAM: CT HEAD WITHOUT CONTRAST  TECHNIQUE: Contiguous axial images were obtained from the base of the skull through the vertex without intravenous contrast.  COMPARISON:  04/01/2015  FINDINGS: Intracranial contents are symmetrical. No ventricular dilatation. No mass effect or midline shift. No abnormal extra-axial fluid collections. Gray-white matter junctions are distinct. Basal cisterns are not effaced. No evidence of acute intracranial hemorrhage. No depressed skull fractures. Mucosal thickening in the paranasal sinuses. Mastoid air cells are not opacified.  IMPRESSION: No acute intracranial abnormalities.    Electronically Signed   By: Lucienne Capers M.D.   On: 04/04/2015 02:04   Ct Head Wo Contrast  04/01/2015   CLINICAL DATA:  Headache for 2 days with nausea and vomiting and blurred vision.  EXAM: CT HEAD WITHOUT CONTRAST  TECHNIQUE: Contiguous axial images were obtained from the base of the skull through the vertex without intravenous contrast.  COMPARISON:  12/25/2014  FINDINGS: No mass lesion. No midline shift. No acute hemorrhage or hematoma. No extra-axial fluid collections. No evidence of acute infarction. Brain parenchyma is normal. Osseous structures are normal.  IMPRESSION: Normal exam.   Electronically Signed   By: Lorriane Shire M.D.   On: 04/01/2015 13:57   US Abdomen Complete  04/03/2015   CLINICAL DATA:  Hepatitis C  EXAM: ULTRASOUND ABDOMEN COMPLETE  COMPARISON:  None.  FINDINGS: Gallbladder: No gallstones, gallbladder wall thickening, or pericholecystic fluid. Negative sonographic Murphy's sign.  Common bile duct: Diameter: 5 mm  Liver: No focal lesion identified. Within normal limits in parenchymal echogenicity.  IVC: No abnormality visualized.  Pancreas: Visualized portion unremarkable. Two peripancreatic nodes measuring up to 13 mm short axis.  Spleen: Size and appearance within normal limits.  Right Kidney: Length: 11.0 cm. Echogenic renal parenchyma. No mass or hydronephrosis.  Left Kidney: Length: 11.1 cm. Echogenic renal parenchyma. No mass or hydronephrosis.  Abdominal aorta: No aneurysm visualized.  Other findings: None.  IMPRESSION: Echogenic renal parenchyma, suggesting medical renal disease. No hydronephrosis.  Otherwise negative abdominal ultrasound.   Electronically Signed   By: Julian Hy M.D.   On: 04/03/2015 11:52   US Renal  04/01/2015   CLINICAL DATA:  Increased creatinine  EXAM: RENAL / URINARY TRACT ULTRASOUND COMPLETE  COMPARISON:  None.  FINDINGS: Right Kidney:  Length: 11 cm.  Echogenic cortex.  No hydronephrosis or mass lesion  Left Kidney:  Length: 11 cm.   Echogenic cortex.  No hydronephrosis or mass lesion  Bladder:  Appears normal for degree of bladder distention. Patent bilateral ureters with jets.  IMPRESSION: 1. Medical renal disease without atrophy. 2. No hydronephrosis.   Electronically Signed   By: Monte Fantasia M.D.   On: 04/01/2015 16:13    Microbiology: Recent Results (from the past 240 hour(s))  MRSA PCR Screening     Status: None   Collection Time: 04/01/15  6:30 PM  Result Value Ref Range Status   MRSA by PCR NEGATIVE NEGATIVE Final    Comment:        The GeneXpert MRSA Assay (FDA approved for NASAL specimens only), is one component of a comprehensive MRSA colonization surveillance program. It is not intended to diagnose MRSA infection nor to guide or monitor treatment for MRSA infections.      Labs: Basic Metabolic Panel:  Recent Labs Lab 04/02/15 0425 04/03/15 0604 04/04/15  [id number] 04/05/15 0624 04/06/15 0617 04/07/15 0616  NA 138 136 135 134* 131* 136  K 3.6 3.4* 4.5 4.4 4.0 3.8  CL 98* 97* 101 101 99* 104  CO2 26 26 24 23 22 22   GLUCOSE 100* 98 99 110* 99 98  BUN 67* 67* 64* 61* 60* 59*  CREATININE 7.21* 7.63* 7.50* 6.86* 6.88* 6.73*  CALCIUM 8.0*  8.2* 8.1* 8.6* 8.5* 8.3* 8.3*  PHOS 8.0*  --   --   --  7.4*  --    Liver Function Tests:  Recent Labs Lab 04/01/15 0847 04/02/15 0425 04/07/15 0616  AST 35 24 25  ALT 46 35 38  ALKPHOS 92 75 66  BILITOT 0.6 0.7 0.5  PROT 6.7 5.9* 6.1*  ALBUMIN 3.4* 3.0* 3.1*    Recent Labs Lab 04/01/15 0847  LIPASE 51   No results for input(s): AMMONIA in the last 168 hours. CBC:  Recent Labs Lab 04/01/15 0847 04/02/15 0425 04/03/15 0604 04/04/15 0617 04/05/15 0624  WBC 4.4 5.4 4.1 4.4 5.6  NEUTROABS 2.6  --   --   --   --   HGB 11.5* 10.9* 10.7* 10.7* 10.6*  HCT 32.7* 31.2* 31.2* 32.0* 31.6*  MCV 86.5 87.2 87.4 88.6 88.8  PLT 145* 147* 142* 129* 126*   Cardiac Enzymes:  Recent Labs Lab 04/01/15 0847 04/01/15 1702 04/01/15 2232  CKTOTAL   --  189  --   TROPONINI 0.04* 0.05* 0.05*   BNP: BNP (last 3 results)  Recent Labs  04/01/15 0847  BNP 447.0*    ProBNP (last 3 results) No results for input(s): PROBNP in the last 8760 hours.  CBG: No results for input(s): GLUCAP in the last 168 hours.     SignedRadene Gunning  [facility] 04/08/2015, 7:14 AM

## 2015-04-09 LAB — METANEPHRINES, URINE, 24 HOUR
Metaneph Total, Ur: 77 ug/L
Metanephrines, 24H Ur: 268 ug/24 hr (ref 45–290)
NORMETANEPHRINE UR: 193 ug/L
Normetanephrine, 24H Ur: 671 ug/24 hr — ABNORMAL HIGH (ref 82–500)
Total Volume: 3475

## 2015-04-11 LAB — HEPATITIS C GENOTYPE

## 2015-04-11 NOTE — Discharge Summary (Signed)
Physician Discharge Summary  Terrance Reynolds [id number] DOB: 1981-07-28 DOA: 04/01/2015  PCP: No PCP Per Patient  Admit date: 04/01/2015 Discharge date: 04/07/2015  Time spent: 40 minutes  Recommendations for Outpatient Follow-up:   1. Lab work in 3 days to be followed by Dr Hinda Lenis for evaluation of kidney failure 2. [facility] 04/16/15 for evaluation of BP control, thrombocytopenia and Hep C  Discharge Diagnoses:  Principal Problem:   Hypertensive crisis Active Problems:   Acute renal failure   Acute on chronic renal failure   Elevated troponin   Dyspnea   Leg edema   Anemia   Thrombocytopenia   Substance abuse   Hepatitis C   Headache   Discharge Condition: stable  Diet recommendation: heart healthy  Filed Weights   04/01/15 0814 04/02/15 0500  Weight: 86.183 kg (190 lb) 79.5 kg (175 lb 4.3 oz)    History of present illness:  Terrance Reynolds is a 34 y.o. male with past medical hx of HTN, CKD and non-compliance presented to ED on 6/15=4/16 with chief complaint of headache and nausea and vomiting. Initial evaluation revealed hypertensive crisis, worsening renal failure, elevated troponin and abnormal EKG.  Patient statef 2 day history of nausea vomiting and decreased by mouth intake. He reportrf he haf not had his blood pressure medicine for about a month. He reported not having a PCP and claimed he recieves anti-hypertensive medication from the emergency department. Patient works outside and reported he thought he was possibly dehydrated but felt no better after drinking water. He had 2 episodes of emesis. Reported no coffee ground emesis. Denied chest pain palpitation but did endorse mild shortness of breath since he came to the emergency department. He denied fever chills cough visual disturbances numbness tingling of extremities. He denied abdominal pain diarrhea constipation.  Workup in the emergency department revealed hemoglobin of 11.5,  platelets of 145 chloride 95 BUN 58 creatinine 6.83 calcium 8.2, troponin 0.04. EKG RSR' in V1 or V2, probably normal variant LVH with secondary repolarization abnormality Borderline prolonged QT interval Baseline wander in lead(s) V3 V4. Urinalysis moderate hemoglobin.  In the emergency department blood pressure was 184/134 with a heart rate of 86 he was afebrile not hypoxic. He was given IV fluids and 1 dose of hydralazine as well as Zofran.  Hospital Course:  Hypertensive crisis: Likely related to noncompliance. Improved gradually. Will be discharged with  labetolol, procardia and hydralazine. Follow up with PCP 04/16/15 as noted above.     Acute on chronic renal failure; secondary to #1. Creatinine 6.73 at discharge. Evaluated by nephrology who opine renal function stable at discharge. Potassium within limits of normal. Urine output good. Will have BMET in 3 days to be followed by Dr Hinda Lenis to evaluate renal function and possible need for dialysis   Elevated troponin: No chest pain. Likely related to above. No events on tele. 2-D echo with mild LVH and grade 2 diastolic dysfunction.   Leg edema: resolved. EKG with LVH. BNP 447. See above    Dyspnea: resolved.  Anemia: LDH elevated with low haptaglobin. 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 Follow up with PCP   Thrombocytopenia: related to #1. Follow up with PCP  Positive Hepatitis C antibody: evaluated by GI who opines no stigmata of chronic liver disease. Upper abdominal US unremarkable. HCVRNA is 770, genotype has been ordered. Hep B surface antigen <  3.1.  Hx substance abuse: daily methadone at clinic. Methadone dose verified with clinic [phone]).   Headache. Initially thought to be related to severe hypertension. May have element of migraine. resolved.  Nausea and vomiting. Related to  headache. Rsolved.    Procedures:  Echo: The cavity size was mildly dilated. Wall thickness was increased in a pattern of mild LVH. The estimated ejection fraction was 55%. Wall motion was normal; there were no regional wall motion abnormalities. Features are consistent with a pseudonormal left ventricular filling pattern, with concomitant abnormal relaxation and increased filling pressure (grade 2 diastolic dysfunction).  Consultations:  Nephrology  Hematology  gastroenterology   Discharge Exam: Filed Vitals:   04/07/15 1301  BP: 158/97  Pulse: 73  Temp:   Resp:     General: well nourished appears well Cardiovascular: RRR no m/g/r trace LE edema Respiratory: normal effort BS clear bilaterally  Discharge Instructions   Discharge Instructions    Diet - low sodium heart healthy    Complete by:  As directed      Increase activity slowly    Complete by:  As directed           Discharge Medication List as of 04/07/2015 12:27 PM    START taking these medications   Details  hydrALAZINE (APRESOLINE) 50 MG tablet Take 1 tablet (50 mg total) by mouth every 8 (eight) hours., Starting 04/07/2015, Until Discontinued, Print    labetalol (NORMODYNE) 300 MG tablet Take 1 tablet (300 mg total) by mouth 2 (two) times daily., Starting 04/07/2015, Until Discontinued, Print    NIFEdipine (PROCARDIA XL/ADALAT-CC) 90 MG 24 hr tablet Take 1 tablet (90 mg total) by mouth daily., Starting 04/07/2015, Until Discontinued, Print      CONTINUE these medications which have NOT CHANGED   Details  aspirin 325 MG tablet Take 325-650 mg by mouth daily as needed for headache., Until Discontinued, Historical Med    Multiple Vitamin (MULTIVITAMIN WITH MINERALS) TABS tablet Take 1 tablet by mouth daily., Until Discontinued, Historical Med       Allergies  Allergen Reactions  . Penicillins Other (See Comments)    Child hood allergy   Follow-up Information    Follow up with [facility] On 04/16/2015.   Why:  at 9:00   Contact information:   [address] [place] [address] [phone]       Follow up with [facility], MD. Call in 3 days.   Specialty:  Nephrology   Why:  to do blood work to check his renal function   Contact information:   [address] [place] [address] [phone]       Follow up with REHMAN,NAJEEB U, MD. Schedule an appointment as soon as possible for a visit in 2 weeks.   Specialty:  Gastroenterology   Contact information:   [place], [address] [place] [address] [phone]        The results of significant diagnostics from this hospitalization (including imaging, microbiology, ancillary and laboratory) are listed below for reference.    Significant Diagnostic Studies: X-ray Chest Pa And Lateral  04/01/2015   CLINICAL DATA:  One day history of shortness of breath  EXAM: CHEST  2 VIEW  COMPARISON:  None.  FINDINGS: There is no edema or consolidation. The heart size and pulmonary vascularity are normal. No adenopathy. No bone lesions.  IMPRESSION: No edema or consolidation.   Electronically Signed   By: Lowella Grip  III M.D.   On: 04/01/2015 15:25   Ct Head Wo Contrast  04/04/2015   CLINICAL DATA:  Patient woke up with severe headache in elevated blood pressure.  EXAM: CT HEAD WITHOUT CONTRAST  TECHNIQUE: Contiguous axial images were obtained from the base of the skull through the vertex without intravenous contrast.  COMPARISON:  04/01/2015  FINDINGS: Intracranial contents are symmetrical. No ventricular dilatation. No mass effect or midline shift. No abnormal extra-axial fluid collections. Gray-white matter junctions are distinct. Basal cisterns are not effaced. No evidence of acute intracranial hemorrhage. No depressed skull fractures. Mucosal thickening in the paranasal sinuses. Mastoid air cells are not opacified.  IMPRESSION: No acute intracranial abnormalities.    Electronically Signed   By: Lucienne Capers M.D.   On: 04/04/2015 02:04   Ct Head Wo Contrast  04/01/2015   CLINICAL DATA:  Headache for 2 days with nausea and vomiting and blurred vision.  EXAM: CT HEAD WITHOUT CONTRAST  TECHNIQUE: Contiguous axial images were obtained from the base of the skull through the vertex without intravenous contrast.  COMPARISON:  12/25/2014  FINDINGS: No mass lesion. No midline shift. No acute hemorrhage or hematoma. No extra-axial fluid collections. No evidence of acute infarction. Brain parenchyma is normal. Osseous structures are normal.  IMPRESSION: Normal exam.   Electronically Signed   By: Lorriane Shire M.D.   On: 04/01/2015 13:57   US Abdomen Complete  04/03/2015   CLINICAL DATA:  Hepatitis C  EXAM: ULTRASOUND ABDOMEN COMPLETE  COMPARISON:  None.  FINDINGS: Gallbladder: No gallstones, gallbladder wall thickening, or pericholecystic fluid. Negative sonographic Murphy's sign.  Common bile duct: Diameter: 5 mm  Liver: No focal lesion identified. Within normal limits in parenchymal echogenicity.  IVC: No abnormality visualized.  Pancreas: Visualized portion unremarkable. Two peripancreatic nodes measuring up to 13 mm short axis.  Spleen: Size and appearance within normal limits.  Right Kidney: Length: 11.0 cm. Echogenic renal parenchyma. No mass or hydronephrosis.  Left Kidney: Length: 11.1 cm. Echogenic renal parenchyma. No mass or hydronephrosis.  Abdominal aorta: No aneurysm visualized.  Other findings: None.  IMPRESSION: Echogenic renal parenchyma, suggesting medical renal disease. No hydronephrosis.  Otherwise negative abdominal ultrasound.   Electronically Signed   By: Julian Hy M.D.   On: 04/03/2015 11:52   US Renal  04/01/2015   CLINICAL DATA:  Increased creatinine  EXAM: RENAL / URINARY TRACT ULTRASOUND COMPLETE  COMPARISON:  None.  FINDINGS: Right Kidney:  Length: 11 cm.  Echogenic cortex.  No hydronephrosis or mass lesion  Left Kidney:  Length: 11 cm.   Echogenic cortex.  No hydronephrosis or mass lesion  Bladder:  Appears normal for degree of bladder distention. Patent bilateral ureters with jets.  IMPRESSION: 1. Medical renal disease without atrophy. 2. No hydronephrosis.   Electronically Signed   By: Monte Fantasia M.D.   On: 04/01/2015 16:13    Microbiology: Recent Results (from the past 240 hour(s))  MRSA PCR Screening     Status: None   Collection Time: 04/01/15  6:30 PM  Result Value Ref Range Status   MRSA by PCR NEGATIVE NEGATIVE Final    Comment:        The GeneXpert MRSA Assay (FDA approved for NASAL specimens only), is one component of a comprehensive MRSA colonization surveillance program. It is not intended to diagnose MRSA infection nor to guide or monitor treatment for MRSA infections.      Labs: Basic Metabolic Panel:  Recent Labs Lab 04/05/15 [id number] 04/06/15 0617 04/07/15  0616  NA 134* 131* 136  K 4.4 4.0 3.8  CL 101 99* 104  CO2 23 22 22   GLUCOSE 110* 99 98  BUN 61* 60* 59*  CREATININE 6.86* 6.88* 6.73*  CALCIUM 8.5* 8.3* 8.3*  PHOS  --  7.4*  --    Liver Function Tests:  Recent Labs Lab 04/07/15 0616  AST 25  ALT 38  ALKPHOS 66  BILITOT 0.5  PROT 6.1*  ALBUMIN 3.1*   No results for input(s): LIPASE, AMYLASE in the last 168 hours. No results for input(s): AMMONIA in the last 168 hours. CBC:  Recent Labs Lab 04/05/15 0624  WBC 5.6  HGB 10.6*  HCT 31.6*  MCV 88.8  PLT 126*   Cardiac Enzymes: No results for input(s): CKTOTAL, CKMB, CKMBINDEX, TROPONINI in the last 168 hours. BNP: BNP (last 3 results)  Recent Labs  04/01/15 0847  BNP 447.0*    ProBNP (last 3 results) No results for input(s): PROBNP in the last 8760 hours.  CBG: No results for input(s): GLUCAP in the last 168 hours.     Signed:  Dyanne Carrel, NP  [facility] 04/11/2015, 11:06 AM   Attending note:  Patient seen and examined. Note reviewed as per Dyanne Carrel, NP  Patient is feeling  better. Nausea, vomiting, headache has resolved. He is anxious for discharge home.  1. Acute on chronic renal failure. Plans are to follow-up with nephrology as an outpatient. He will have a repeat basic metabolic panel drawn in 3 days with results sent to nephrology. If his lab values are not improving, may still need to consider initiation of dialysis, will hold off for now.  2. Malignant hypertension. Blood pressure has improved with initiation of antihypertensives. He will continue on his current regimen. Follow-up with primary care physician as an outpatient.  3. Positive hepatitis C antibody. Patient is positive for hepatitis C antibody. Hepatitis C quantitative RNA was mildly positive at 770 . genotype is currently in process. Ultrasound of the liver did not show any evidence of cirrhosis. He will follow-up with GI and will likely repeat RNA in the next several weeks.  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 Seen by hematology with no further recommendations. Hemoglobin and platelets are stable.  Trendon Zaring

## 2015-04-11 NOTE — Progress Notes (Deleted)
Physician Discharge Summary  Terrance Reynolds [id number] DOB: 02-Mar-1981 DOA: 04/01/2015  PCP: No PCP Per Patient  Admit date: 04/01/2015 Discharge date: 04/07/2015  Time spent: 40 minutes  Recommendations for Outpatient Follow-up:   1. Lab work in 3 days to be followed by Dr Hinda Lenis for evaluation of kidney failure 2. [facility] 04/16/15 for evaluation of BP control, thrombocytopenia and Hep C  Discharge Diagnoses:  Principal Problem:   Hypertensive crisis Active Problems:   Acute renal failure   Acute on chronic renal failure   Elevated troponin   Dyspnea   Leg edema   Anemia   Thrombocytopenia   Substance abuse   Hepatitis C   Headache   Discharge Condition: stable  Diet recommendation: heart healthy  Filed Weights   04/01/15 0814 04/02/15 0500  Weight: 86.183 kg (190 lb) 79.5 kg (175 lb 4.3 oz)    History of present illness:  Terrance Reynolds is a 34 y.o. male with past medical hx of HTN, CKD and non-compliance presented to ED on 6/15=4/16 with chief complaint of headache and nausea and vomiting. Initial evaluation revealed hypertensive crisis, worsening renal failure, elevated troponin and abnormal EKG.  Patient statef 2 day history of nausea vomiting and decreased by mouth intake. He reportrf he haf not had his blood pressure medicine for about a month. He reported not having a PCP and claimed he recieves anti-hypertensive medication from the emergency department. Patient works outside and reported he thought he was possibly dehydrated but felt no better after drinking water. He had 2 episodes of emesis. Reported no coffee ground emesis. Denied chest pain palpitation but did endorse mild shortness of breath since he came to the emergency department. He denied fever chills cough visual disturbances numbness tingling of extremities. He denied abdominal pain diarrhea constipation.  Workup in the emergency department revealed hemoglobin of 11.5,  platelets of 145 chloride 95 BUN 58 creatinine 6.83 calcium 8.2, troponin 0.04. EKG RSR' in V1 or V2, probably normal variant LVH with secondary repolarization abnormality Borderline prolonged QT interval Baseline wander in lead(s) V3 V4. Urinalysis moderate hemoglobin.  In the emergency department blood pressure was 184/134 with a heart rate of 86 he was afebrile not hypoxic. He was given IV fluids and 1 dose of hydralazine as well as Zofran.  Hospital Course:  Hypertensive crisis: Likely related to noncompliance. Improved gradually. Will be discharged with  labetolol, procardia and hydralazine. Follow up with PCP 04/16/15 as noted above.     Acute on chronic renal failure; secondary to #1. Creatinine 6.73 at discharge. Evaluated by nephrology who opine renal function stable at discharge. Potassium within limits of normal. Urine output good. Will have BMET in 3 days to be followed by Dr Hinda Lenis to evaluate renal function and possible need for dialysis   Elevated troponin: No chest pain. Likely related to above. No events on tele. 2-D echo with mild LVH and grade 2 diastolic dysfunction.   Leg edema: resolved. EKG with LVH. BNP 447. See above    Dyspnea: resolved.  Anemia: LDH elevated with low haptaglobin. 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 Follow up with PCP   Thrombocytopenia: related to #1. Follow up with PCP  Positive Hepatitis C antibody: evaluated by GI who opines no stigmata of chronic liver disease. Upper abdominal US unremarkable. HCVRNA is 770, genotype has been ordered. Hep B surface antigen <  3.1.  Hx substance abuse: daily methadone at clinic. Methadone dose verified with clinic [phone]).   Headache. Initially thought to be related to severe hypertension. May have element of migraine. resolved.  Nausea and vomiting. Related to  headache. Rsolved.    Procedures:  Echo: The cavity size was mildly dilated. Wall thickness was increased in a pattern of mild LVH. The estimated ejection fraction was 55%. Wall motion was normal; there were no regional wall motion abnormalities. Features are consistent with a pseudonormal left ventricular filling pattern, with concomitant abnormal relaxation and increased filling pressure (grade 2 diastolic dysfunction).  Consultations:  Nephrology  Hematology  gastroenterology   Discharge Exam: Filed Vitals:   04/07/15 1301  BP: 158/97  Pulse: 73  Temp:   Resp:     General: well nourished appears well Cardiovascular: RRR no m/g/r trace LE edema Respiratory: normal effort BS clear bilaterally  Discharge Instructions   Discharge Instructions    Diet - low sodium heart healthy    Complete by:  As directed      Increase activity slowly    Complete by:  As directed           Discharge Medication List as of 04/07/2015 12:27 PM    START taking these medications   Details  hydrALAZINE (APRESOLINE) 50 MG tablet Take 1 tablet (50 mg total) by mouth every 8 (eight) hours., Starting 04/07/2015, Until Discontinued, Print    labetalol (NORMODYNE) 300 MG tablet Take 1 tablet (300 mg total) by mouth 2 (two) times daily., Starting 04/07/2015, Until Discontinued, Print    NIFEdipine (PROCARDIA XL/ADALAT-CC) 90 MG 24 hr tablet Take 1 tablet (90 mg total) by mouth daily., Starting 04/07/2015, Until Discontinued, Print      CONTINUE these medications which have NOT CHANGED   Details  aspirin 325 MG tablet Take 325-650 mg by mouth daily as needed for headache., Until Discontinued, Historical Med    Multiple Vitamin (MULTIVITAMIN WITH MINERALS) TABS tablet Take 1 tablet by mouth daily., Until Discontinued, Historical Med       Allergies  Allergen Reactions  . Penicillins Other (See Comments)    Child hood allergy   Follow-up Information    Follow up with [facility] On 04/16/2015.   Why:  at 9:00   Contact information:   [address] [place] [address] [phone]       Follow up with [facility], MD. Call in 3 days.   Specialty:  Nephrology   Why:  to do blood work to check his renal function   Contact information:   [address] [address] [phone]       Follow up with REHMAN,NAJEEB U, MD. Schedule an appointment as soon as possible for a visit in 2 weeks.   Specialty:  Gastroenterology   Contact information:   [place], [address] [place] [address] [phone]        The results of significant diagnostics from this hospitalization (including imaging, microbiology, ancillary and laboratory) are listed below for reference.    Significant Diagnostic Studies: X-ray Chest Pa And Lateral  04/01/2015   CLINICAL DATA:  One day history of shortness of breath  EXAM: CHEST  2 VIEW  COMPARISON:  None.  FINDINGS: There is no edema or consolidation. The heart size and pulmonary vascularity are normal. No adenopathy. No bone lesions.  IMPRESSION: No edema or consolidation.   Electronically Signed   By: Lowella Grip  III M.D.   On: 04/01/2015 15:25   Ct Head Wo Contrast  04/04/2015   CLINICAL DATA:  Patient woke up with severe headache in elevated blood pressure.  EXAM: CT HEAD WITHOUT CONTRAST  TECHNIQUE: Contiguous axial images were obtained from the base of the skull through the vertex without intravenous contrast.  COMPARISON:  04/01/2015  FINDINGS: Intracranial contents are symmetrical. No ventricular dilatation. No mass effect or midline shift. No abnormal extra-axial fluid collections. Gray-white matter junctions are distinct. Basal cisterns are not effaced. No evidence of acute intracranial hemorrhage. No depressed skull fractures. Mucosal thickening in the paranasal sinuses. Mastoid air cells are not opacified.  IMPRESSION: No acute intracranial abnormalities.    Electronically Signed   By: Lucienne Capers M.D.   On: 04/04/2015 02:04   Ct Head Wo Contrast  04/01/2015   CLINICAL DATA:  Headache for 2 days with nausea and vomiting and blurred vision.  EXAM: CT HEAD WITHOUT CONTRAST  TECHNIQUE: Contiguous axial images were obtained from the base of the skull through the vertex without intravenous contrast.  COMPARISON:  12/25/2014  FINDINGS: No mass lesion. No midline shift. No acute hemorrhage or hematoma. No extra-axial fluid collections. No evidence of acute infarction. Brain parenchyma is normal. Osseous structures are normal.  IMPRESSION: Normal exam.   Electronically Signed   By: Lorriane Shire M.D.   On: 04/01/2015 13:57   US Abdomen Complete  04/03/2015   CLINICAL DATA:  Hepatitis C  EXAM: ULTRASOUND ABDOMEN COMPLETE  COMPARISON:  None.  FINDINGS: Gallbladder: No gallstones, gallbladder wall thickening, or pericholecystic fluid. Negative sonographic Murphy's sign.  Common bile duct: Diameter: 5 mm  Liver: No focal lesion identified. Within normal limits in parenchymal echogenicity.  IVC: No abnormality visualized.  Pancreas: Visualized portion unremarkable. Two peripancreatic nodes measuring up to 13 mm short axis.  Spleen: Size and appearance within normal limits.  Right Kidney: Length: 11.0 cm. Echogenic renal parenchyma. No mass or hydronephrosis.  Left Kidney: Length: 11.1 cm. Echogenic renal parenchyma. No mass or hydronephrosis.  Abdominal aorta: No aneurysm visualized.  Other findings: None.  IMPRESSION: Echogenic renal parenchyma, suggesting medical renal disease. No hydronephrosis.  Otherwise negative abdominal ultrasound.   Electronically Signed   By: Julian Hy M.D.   On: 04/03/2015 11:52   US Renal  04/01/2015   CLINICAL DATA:  Increased creatinine  EXAM: RENAL / URINARY TRACT ULTRASOUND COMPLETE  COMPARISON:  None.  FINDINGS: Right Kidney:  Length: 11 cm.  Echogenic cortex.  No hydronephrosis or mass lesion  Left Kidney:  Length: 11 cm.   Echogenic cortex.  No hydronephrosis or mass lesion  Bladder:  Appears normal for degree of bladder distention. Patent bilateral ureters with jets.  IMPRESSION: 1. Medical renal disease without atrophy. 2. No hydronephrosis.   Electronically Signed   By: Monte Fantasia M.D.   On: 04/01/2015 16:13    Microbiology: Recent Results (from the past 240 hour(s))  MRSA PCR Screening     Status: None   Collection Time: 04/01/15  6:30 PM  Result Value Ref Range Status   MRSA by PCR NEGATIVE NEGATIVE Final    Comment:        The GeneXpert MRSA Assay (FDA approved for NASAL specimens only), is one component of a comprehensive MRSA colonization surveillance program. It is not intended to diagnose MRSA infection nor to guide or monitor treatment for MRSA infections.      Labs: Basic Metabolic Panel:  Recent Labs Lab 04/05/15 [phone] 04/06/15 0617 04/07/15  0616  NA 134* 131* 136  K 4.4 4.0 3.8  CL 101 99* 104  CO2 23 22 22   GLUCOSE 110* 99 98  BUN 61* 60* 59*  CREATININE 6.86* 6.88* 6.73*  CALCIUM 8.5* 8.3* 8.3*  PHOS  --  7.4*  --    Liver Function Tests:  Recent Labs Lab 04/07/15 0616  AST 25  ALT 38  ALKPHOS 66  BILITOT 0.5  PROT 6.1*  ALBUMIN 3.1*   No results for input(s): LIPASE, AMYLASE in the last 168 hours. No results for input(s): AMMONIA in the last 168 hours. CBC:  Recent Labs Lab 04/05/15 0624  WBC 5.6  HGB 10.6*  HCT 31.6*  MCV 88.8  PLT 126*   Cardiac Enzymes: No results for input(s): CKTOTAL, CKMB, CKMBINDEX, TROPONINI in the last 168 hours. BNP: BNP (last 3 results)  Recent Labs  04/01/15 0847  BNP 447.0*    ProBNP (last 3 results) No results for input(s): PROBNP in the last 8760 hours.  CBG: No results for input(s): GLUCAP in the last 168 hours.     Signed:  Dyanne Carrel, NP  [facility] 04/11/2015, 11:04 AM   Attending note:  Patient seen and examined. Note reviewed as per Dyanne Carrel, NP  Patient is feeling  better. Nausea, vomiting, headache has resolved. He is anxious for discharge home.  1. Acute on chronic renal failure. Plans are to follow-up with nephrology as an outpatient. He will have a repeat basic metabolic panel drawn in 3 days with results sent to nephrology. If his lab values are not improving, may still need to consider initiation of dialysis, will hold off for now.  2. Malignant hypertension. Blood pressure has improved with initiation of antihypertensives. He will continue on his current regimen. Follow-up with primary care physician as an outpatient.  3. Positive hepatitis C antibody. Patient is positive for hepatitis C antibody. Hepatitis C quantitative RNA was mildly positive at 770 . genotype is currently in process. Ultrasound of the liver did not show any evidence of cirrhosis. He will follow-up with GI and will likely repeat RNA in the next several weeks.  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 Seen by hematology with no further recommendations. Hemoglobin and platelets are stable.  MEMON,JEHANZEB

## 2015-04-12 ENCOUNTER — Emergency Department (HOSPITAL_COMMUNITY)
Admission: EM | Admit: 2015-04-12 | Discharge: 2015-04-12 | Disposition: A | Discharge status: Home / Self Care | Payer: Medicaid Other | Attending: Emergency Medicine | Admitting: Emergency Medicine

## 2015-04-12 ENCOUNTER — Emergency Department (HOSPITAL_COMMUNITY): Payer: Medicaid Other

## 2015-04-12 ENCOUNTER — Encounter (HOSPITAL_COMMUNITY): Payer: Self-pay | Admitting: Emergency Medicine

## 2015-04-12 DIAGNOSIS — R0602 Shortness of breath: Secondary | ICD-10-CM | POA: Insufficient documentation

## 2015-04-12 DIAGNOSIS — Z720 Tobacco use: Secondary | ICD-10-CM | POA: Diagnosis not present

## 2015-04-12 DIAGNOSIS — Z7982 Long term (current) use of aspirin: Secondary | ICD-10-CM | POA: Insufficient documentation

## 2015-04-12 DIAGNOSIS — N189 Chronic kidney disease, unspecified: Secondary | ICD-10-CM | POA: Diagnosis not present

## 2015-04-12 DIAGNOSIS — Z79899 Other long term (current) drug therapy: Secondary | ICD-10-CM | POA: Diagnosis not present

## 2015-04-12 DIAGNOSIS — Z872 Personal history of diseases of the skin and subcutaneous tissue: Secondary | ICD-10-CM | POA: Insufficient documentation

## 2015-04-12 DIAGNOSIS — R110 Nausea: Secondary | ICD-10-CM

## 2015-04-12 DIAGNOSIS — I129 Hypertensive chronic kidney disease with stage 1 through stage 4 chronic kidney disease, or unspecified chronic kidney disease: Secondary | ICD-10-CM | POA: Insufficient documentation

## 2015-04-12 DIAGNOSIS — Z862 Personal history of diseases of the blood and blood-forming organs and certain disorders involving the immune mechanism: Secondary | ICD-10-CM | POA: Diagnosis not present

## 2015-04-12 DIAGNOSIS — R7989 Other specified abnormal findings of blood chemistry: Secondary | ICD-10-CM | POA: Diagnosis present

## 2015-04-12 DIAGNOSIS — Z880 Allergy status to penicillin: Secondary | ICD-10-CM | POA: Insufficient documentation

## 2015-04-12 LAB — CBC WITH DIFFERENTIAL/PLATELET
BASOS ABS: 0 10*3/uL (ref 0.0–0.1)
Basophils Relative: 0 % (ref 0–1)
Eosinophils Absolute: 0.1 10*3/uL (ref 0.0–0.7)
Eosinophils Relative: 1 % (ref 0–5)
HCT: 30.8 % — ABNORMAL LOW (ref 39.0–52.0)
HEMOGLOBIN: 10.7 g/dL — AB (ref 13.0–17.0)
Lymphocytes Relative: 15 % (ref 12–46)
Lymphs Abs: 0.9 10*3/uL (ref 0.7–4.0)
MCH: 29.9 pg (ref 26.0–34.0)
MCHC: 34.7 g/dL (ref 30.0–36.0)
MCV: 86 fL (ref 78.0–100.0)
MONO ABS: 0.5 10*3/uL (ref 0.1–1.0)
MONOS PCT: 8 % (ref 3–12)
NEUTROS PCT: 76 % (ref 43–77)
Neutro Abs: 4.4 10*3/uL (ref 1.7–7.7)
Platelets: 189 10*3/uL (ref 150–400)
RBC: 3.58 MIL/uL — AB (ref 4.22–5.81)
RDW: 12.1 % (ref 11.5–15.5)
WBC: 5.8 10*3/uL (ref 4.0–10.5)

## 2015-04-12 LAB — BASIC METABOLIC PANEL
Anion gap: 13 (ref 5–15)
BUN: 77 mg/dL — ABNORMAL HIGH (ref 6–20)
CALCIUM: 8.5 mg/dL — AB (ref 8.9–10.3)
CHLORIDE: 100 mmol/L — AB (ref 101–111)
CO2: 21 mmol/L — ABNORMAL LOW (ref 22–32)
Creatinine, Ser: 7.58 mg/dL — ABNORMAL HIGH (ref 0.61–1.24)
GFR, EST AFRICAN AMERICAN: 10 mL/min — AB (ref >60)
GFR, EST NON AFRICAN AMERICAN: 8 mL/min — AB (ref >60)
Glucose, Bld: 118 mg/dL — ABNORMAL HIGH (ref 65–99)
POTASSIUM: 4.5 mmol/L (ref 3.5–5.1)
Sodium: 134 mmol/L — ABNORMAL LOW (ref 135–145)

## 2015-04-12 MED ORDER — PROMETHAZINE HCL 25 MG PO TABS: 25.0000 mg | ORAL_TABLET | Freq: Four times a day (QID) | ORAL | Status: DC | PRN

## 2015-04-12 NOTE — ED Notes (Signed)
Abnormal lab per PCP.  Fatigue, weakness.  Here for possible HD.  Swelling in ankles and hands.

## 2015-04-12 NOTE — ED Provider Notes (Signed)
CSN: [id number]     Arrival date & time 04/12/15  0903 History   First MD Initiated Contact with Patient 04/12/15 [phone]     Chief Complaint  Patient presents with  . Abnormal Lab     (Consider location/radiation/quality/duration/timing/severity/associated sxs/prior Treatment) HPI  Terrance Reynolds is a 34 y.o. male with a history of hypertension, recent acute on chronic renal failure requiring admission secondary to hypertensive crisis presenting for further evaluation of his kidney disease. He will seen by his nephrologist Dr. Hinda Lenis 2 days ago when it was discovered that his creatinine is trending upward again, patient stating it was "almost 8".  He was sent here today in anticipation of catheter placement and dialysis if symptoms and labs warrant. He reports generalized fatigue, dry cough with mild sob and has had increased ankle edema since his dc from here 5 days ago. He denies chest pain, fevers, abdominal pain.  He did wake with emesis x 1 this am, but now resolved.  He took his bp medication this am and has had breakfast with no further nausea.    Past Medical History  Diagnosis Date  . Psoriasis   . Hypertension   . Substance abuse     methadone clinic  . Anemia   . Thrombocytopenia   . Acute on chronic renal failure   . CKD (chronic kidney disease)    History reviewed. No pertinent past surgical history. History reviewed. No pertinent family history. History  Substance Use Topics  . Smoking status: Current Every Day Smoker  . Smokeless tobacco: Not on file  . Alcohol Use: No    Review of Systems  Constitutional: Negative for fever and chills.  HENT: Negative for congestion and sore throat.   Eyes: Negative.   Respiratory: Positive for shortness of breath. Negative for chest tightness and wheezing.   Cardiovascular: Positive for leg swelling. Negative for chest pain.  Gastrointestinal: Positive for nausea and vomiting. Negative for abdominal pain.  Genitourinary:  Negative.   Musculoskeletal: Negative for joint swelling, arthralgias and neck pain.  Skin: Negative.  Negative for rash and wound.  Neurological: Negative for dizziness, weakness, light-headedness, numbness and headaches.  Psychiatric/Behavioral: Negative.       Allergies  Penicillins  Home Medications   Prior to Admission medications   Medication Sig Start Date End Date Taking? Authorizing Provider  hydrALAZINE (APRESOLINE) 50 MG tablet Take 1 tablet (50 mg total) by mouth every 8 (eight) hours. 04/07/15  Yes Lezlie Octave Black, NP  labetalol (NORMODYNE) 300 MG tablet Take 1 tablet (300 mg total) by mouth 2 (two) times daily. 04/07/15  Yes Radene Gunning, NP  Multiple Vitamin (MULTIVITAMIN WITH MINERALS) TABS tablet Take 1 tablet by mouth daily.   Yes Historical Provider, MD  NIFEdipine (PROCARDIA XL/ADALAT-CC) 90 MG 24 hr tablet Take 1 tablet (90 mg total) by mouth daily. 04/07/15  Yes Radene Gunning, NP  aspirin 325 MG tablet Take 325-650 mg by mouth daily as needed for headache.    Historical Provider, MD  calcium acetate (PHOSLO) 667 MG capsule Take 1 capsule (667 mg total) by mouth 3 (three) times daily with meals. 04/07/15   Kathie Dike, MD  promethazine (PHENERGAN) 25 MG tablet Take 1 tablet (25 mg total) by mouth every 6 (six) hours as needed for nausea or vomiting. 04/12/15   Evalee Jefferson, PA-C   BP 124/72 mmHg  Pulse 86  Temp(Src) 97.8 F (36.6 C) (Oral)  Resp 20  Ht 6\' 2"  (1.88 m)  Wt 190 lb (86.183 kg)  BMI 24.38 kg/m2  SpO2 99% Physical Exam  Constitutional: He appears well-developed and well-nourished. No distress.  HENT:  Head: Normocephalic and atraumatic.  Eyes: Conjunctivae are normal.  Neck: Neck supple.  Cardiovascular: Normal rate, regular rhythm and intact distal pulses.   Pitting edema bilateral ankles.  Pulmonary/Chest: Effort normal and breath sounds normal. No respiratory distress. He has no wheezes. He has no rales.  Abdominal: Soft. Bowel sounds are  normal. There is no tenderness.  Musculoskeletal: Normal range of motion.  Neurological: He is alert.  Skin: Skin is warm and dry.  Psychiatric: He has a normal mood and affect.  Nursing note and vitals reviewed.   ED Course  Procedures (including critical care time) Labs Review Labs Reviewed  CBC WITH DIFFERENTIAL/PLATELET - Abnormal; Notable for the following:    RBC 3.58 (*)    Hemoglobin 10.7 (*)    HCT 30.8 (*)    All other components within normal limits  BASIC METABOLIC PANEL - Abnormal; Notable for the following:    Sodium 134 (*)    Chloride 100 (*)    CO2 21 (*)    Glucose, Bld 118 (*)    BUN 77 (*)    Creatinine, Ser 7.58 (*)    Calcium 8.5 (*)    GFR calc non Af Amer 8 (*)    GFR calc Af Amer 10 (*)    All other components within normal limits    Imaging Review Dg Chest 2 View  04/12/2015   CLINICAL DATA:  Shortness of breath, peripheral edema, hypertension, renal failure  EXAM: CHEST  2 VIEW  COMPARISON:  04/01/2015  FINDINGS: Normal heart size and vascularity. No focal pneumonia, collapse or consolidation. No edema, effusion, or pneumothorax. Trachea midline. Stable mild upper thoracic scoliosis. Mild central bronchitic change and interstitial prominence, nonspecific.  IMPRESSION: Stable bronchitic change and interstitial prominence. No superimposed acute process.   Electronically Signed   By: Jerilynn Mages.  Shick M.D.   On: 04/12/2015 10:08     EKG Interpretation None      MDM   Final diagnoses:  Chronic renal failure, unspecified stage  Nausea    Patients labs and/or radiological studies were reviewed and considered during the medical decision making and disposition process.  Results were also discussed with patient. Potassium stable, no signs of pulmonary edema, he does have peripheral edema bilateral ankles.  Pt advised he can go home today but will need to return here on Monday, in 2 days for purpose of admission for catheter line placement and to institute  dialysis per Dr. Lowanda Foster.  He was prescribed Phenergan to help him with his nausea.  Patient was discussed with Dr. Sabra Heck during this visit.  Evalee Jefferson, PA-C 04/12/15 1114  Noemi Chapel, MD 04/12/15 [phone]

## 2015-04-12 NOTE — Discharge Instructions (Signed)
Dialysis °Dialysis is a procedure that replaces some of the work healthy kidneys do. It is done when you lose about 85-90% of your kidney function. It may also be done earlier if your symptoms may be improved by dialysis. During dialysis, wastes, salt, and extra water are removed from the blood, and the levels of certain chemicals in the blood (such as potassium) are maintained. Dialysis is done in sessions. Dialysis sessions are continued until the kidneys get better. If the kidneys cannot get better, such as in end-stage kidney disease, dialysis is continued for life or until you receive a new kidney (kidney transplant). There are two types of dialysis: hemodialysis and peritoneal dialysis. °WHAT IS HEMODIALYSIS?  °Hemodialysis is a type of dialysis in which a machine called a dialyzer is used to filter the blood. Before beginning hemodialysis, you will have surgery to create a site where blood can be removed from the body and returned to the body (vascular access). There are three types of vascular accesses: °· Arteriovenous fistula. To create this type of access, an artery is connected to a vein (usually in the arm). A fistula takes 1-6 months to develop after surgery. If it develops properly, it usually lasts longer than the other types of vascular accesses. It is also less likely to become infected and cause blood clots. °· Arteriovenous graft. To create this type of access, an artery and a vein in the arm are connected with a tube. A graft may be used within 2-3 weeks of surgery. °· A venous catheter. To create this type of access, a thin, flexible tube (catheter) is placed in a large vein in your neck, chest, or groin. A catheter may be used right away. It is usually used as a temporary access when dialysis needs to begin immediately. °During hemodialysis, blood leaves the body through your access. It travels through a tube to the dialyzer, where it is filtered. The blood then returns to your body through  another tube. °Hemodialysis is usually performed by a health care provider at a hospital or dialysis center three times a week. Visits last about 3-4 hours. It may also be performed with the help of another person at home with training.  °WHAT IS PERITONEAL DIALYSIS? °Peritoneal dialysis is a type of dialysis in which the thin lining of the abdomen (peritoneum) is used as a filter. Before beginning peritoneal dialysis, you will have surgery to place a catheter in your abdomen. The catheter will be used to transfer a fluid called dialysate to and from your abdomen. At the start of a session, your abdomen is filled with dialysate. During the session, wastes, salt, and extra water in the blood pass through the peritoneum and into the dialysate. The dialysate is drained from the body at the end of the session. The process of filling and draining the dialysate is called an exchange. Exchanges are repeated until you have used up all the dialysate for the day. °Peritoneal dialysis may be performed by you at home or at almost any other location. It is done every day. You may need up to five exchanges a day. The amount of time the dialysate is in your body between exchanges is called a dwell. The dwell depends on the number of exchanges needed and the characteristics of the peritoneum. It usually varies from 1.5-3 hours. You may go about your day normally between exchanges. Alternately, the exchanges may be done at night while you sleep, using a machine called a cycler. °WHICH TYPE   OF DIALYSIS SHOULD I CHOOSE?  Both hemodialysis and peritoneal dialysis have advantages and disadvantages. Talk to your health care provider about which type of dialysis would be best for you. Your lifestyle and preferences should be considered along with your medical condition. In some cases, only one type of dialysis may be an option.  Advantages of hemodialysis  It is done less often than peritoneal dialysis.  Someone else can do the  dialysis for you.  If you go to a dialysis center, your health care provider will be able to recognize any problems right away.  If you go to a dialysis center, you can interact with others who are having dialysis. This can provide you with emotional support. Disadvantages of hemodialysis  Hemodialysis may cause cramps and low blood pressure. It may leave you feeling tired on the days you have the treatment.  If you go to a dialysis center, you will need to make weekly appointments and work around the center's schedule.  You will need to take extra care when traveling. If you go to a dialysis center, you will need to make special arrangements to visit a dialysis center near your destination. If you are having treatments at home, you will need to take the dialyzer with you to your destination.  You will need to avoid more foods than you would need to avoid on peritoneal dialysis. Advantages of peritoneal dialysis  It is less likely than hemodialysis to cause cramps and low blood pressure.  You may do exchanges on your own wherever you are, including when you travel.  You do not need to avoid as many foods as you do on hemodialysis. Disadvantages of peritoneal dialysis  It is done more often than hemodialysis.  Performing peritoneal dialysis requires you to have dexterity of the hands. You must also be able to lift bags.  You will have to learn sterilization techniques. You will need to practice them every day to reduce the risk of infection. WHAT CHANGES WILL I NEED TO MAKE TO MY DIET DURING DIALYSIS? Both hemodialysis and peritoneal dialysis require you to make some changes to your diet. For example, you will need to limit your intake of foods high in the minerals phosphorus and potassium. You will also need to limit your fluid intake. Your dietitian can help you plan meals. A good meal plan can improve your dialysis and your health.  WHAT SHOULD I EXPECT WHEN BEGINNING  DIALYSIS? Adjusting to the dialysis treatment, schedule, and diet can take some time. You may need to stop working and may not be able to do some of the things you normally do. You may feel anxious or depressed when beginning dialysis. Eventually, many people feel better overall because of dialysis. Some people are able to return to work after making some changes, such as reducing work intensity. WHERE CAN I FIND MORE INFORMATION?   [place]: www.kidney.org  American Association of Kidney Patients: BombTimer.gl  [employer]: www.kidneyfund.org Document Released: 12/25/2002 Document Revised: 02/18/2014 Document Reviewed: 11/28/2012 [facility] Patient Information 2015 [place], [place]. This information is not intended to replace advice given to you by your health care provider. Make sure you discuss any questions you have with your health care provider.   Dr. Lowanda Foster wants you to return to this emergency department Monday morning for the purpose of admission for placement of a catheter line and to start your dialysis.

## 2015-04-14 ENCOUNTER — Encounter (HOSPITAL_COMMUNITY): Payer: Self-pay | Admitting: Emergency Medicine

## 2015-04-14 ENCOUNTER — Inpatient Hospital Stay (HOSPITAL_COMMUNITY)
Admission: EM | Admit: 2015-04-14 | Discharge: 2015-04-17 | DRG: 683 | Disposition: A | Discharge status: Home / Self Care | Payer: Medicaid Other | Attending: Internal Medicine | Admitting: Internal Medicine

## 2015-04-14 ENCOUNTER — Emergency Department (HOSPITAL_COMMUNITY): Payer: Medicaid Other

## 2015-04-14 DIAGNOSIS — D638 Anemia in other chronic diseases classified elsewhere: Secondary | ICD-10-CM | POA: Diagnosis present

## 2015-04-14 DIAGNOSIS — N189 Chronic kidney disease, unspecified: Secondary | ICD-10-CM | POA: Diagnosis present

## 2015-04-14 DIAGNOSIS — E872 Acidosis, unspecified: Secondary | ICD-10-CM | POA: Diagnosis present

## 2015-04-14 DIAGNOSIS — F1120 Opioid dependence, uncomplicated: Secondary | ICD-10-CM | POA: Diagnosis present

## 2015-04-14 DIAGNOSIS — R0989 Other specified symptoms and signs involving the circulatory and respiratory systems: Secondary | ICD-10-CM | POA: Diagnosis present

## 2015-04-14 DIAGNOSIS — N179 Acute kidney failure, unspecified: Secondary | ICD-10-CM | POA: Diagnosis present

## 2015-04-14 DIAGNOSIS — F17210 Nicotine dependence, cigarettes, uncomplicated: Secondary | ICD-10-CM | POA: Diagnosis present

## 2015-04-14 DIAGNOSIS — N19 Unspecified kidney failure: Secondary | ICD-10-CM

## 2015-04-14 DIAGNOSIS — F1910 Other psychoactive substance abuse, uncomplicated: Secondary | ICD-10-CM | POA: Diagnosis present

## 2015-04-14 DIAGNOSIS — Z823 Family history of stroke: Secondary | ICD-10-CM

## 2015-04-14 DIAGNOSIS — N186 End stage renal disease: Secondary | ICD-10-CM | POA: Insufficient documentation

## 2015-04-14 DIAGNOSIS — I10 Essential (primary) hypertension: Secondary | ICD-10-CM | POA: Diagnosis present

## 2015-04-14 DIAGNOSIS — R600 Localized edema: Secondary | ICD-10-CM | POA: Diagnosis present

## 2015-04-14 DIAGNOSIS — I5189 Other ill-defined heart diseases: Secondary | ICD-10-CM | POA: Diagnosis present

## 2015-04-14 DIAGNOSIS — N185 Chronic kidney disease, stage 5: Secondary | ICD-10-CM | POA: Diagnosis present

## 2015-04-14 DIAGNOSIS — I120 Hypertensive chronic kidney disease with stage 5 chronic kidney disease or end stage renal disease: Secondary | ICD-10-CM | POA: Diagnosis present

## 2015-04-14 HISTORY — DX: Other ill-defined heart diseases: I51.89

## 2015-04-14 HISTORY — DX: Opioid dependence, uncomplicated: F11.20

## 2015-04-14 LAB — BASIC METABOLIC PANEL
ANION GAP: 13 (ref 5–15)
BUN: 80 mg/dL — ABNORMAL HIGH (ref 6–20)
CALCIUM: 8.6 mg/dL — AB (ref 8.9–10.3)
CHLORIDE: 102 mmol/L (ref 101–111)
CO2: 19 mmol/L — AB (ref 22–32)
Creatinine, Ser: 7.41 mg/dL — ABNORMAL HIGH (ref 0.61–1.24)
GFR calc Af Amer: 10 mL/min — ABNORMAL LOW (ref >60)
GFR calc non Af Amer: 9 mL/min — ABNORMAL LOW (ref >60)
GLUCOSE: 139 mg/dL — AB (ref 65–99)
Potassium: 4.4 mmol/L (ref 3.5–5.1)
Sodium: 134 mmol/L — ABNORMAL LOW (ref 135–145)

## 2015-04-14 LAB — CBC WITH DIFFERENTIAL/PLATELET
Basophils Absolute: 0 10*3/uL (ref 0.0–0.1)
Basophils Relative: 0 % (ref 0–1)
Eosinophils Absolute: 0.1 10*3/uL (ref 0.0–0.7)
Eosinophils Relative: 2 % (ref 0–5)
HEMATOCRIT: 32.2 % — AB (ref 39.0–52.0)
Hemoglobin: 11.1 g/dL — ABNORMAL LOW (ref 13.0–17.0)
LYMPHS ABS: 0.7 10*3/uL (ref 0.7–4.0)
Lymphocytes Relative: 10 % — ABNORMAL LOW (ref 12–46)
MCH: 29.7 pg (ref 26.0–34.0)
MCHC: 34.5 g/dL (ref 30.0–36.0)
MCV: 86.1 fL (ref 78.0–100.0)
MONO ABS: 0.6 10*3/uL (ref 0.1–1.0)
MONOS PCT: 9 % (ref 3–12)
Neutro Abs: 5.2 10*3/uL (ref 1.7–7.7)
Neutrophils Relative %: 79 % — ABNORMAL HIGH (ref 43–77)
Platelets: 186 10*3/uL (ref 150–400)
RBC: 3.74 MIL/uL — AB (ref 4.22–5.81)
RDW: 12.1 % (ref 11.5–15.5)
WBC: 6.6 10*3/uL (ref 4.0–10.5)

## 2015-04-14 LAB — PHOSPHORUS: PHOSPHORUS: 6.1 mg/dL — AB (ref 2.5–4.6)

## 2015-04-14 MED ORDER — ALUM & MAG HYDROXIDE-SIMETH 200-200-20 MG/5ML PO SUSP
30.0000 mL | Freq: Four times a day (QID) | ORAL | Status: DC | PRN
  Filled 2015-04-14: qty 30

## 2015-04-14 MED ORDER — ONDANSETRON HCL 4 MG/2ML IJ SOLN
4.0000 mg | Freq: Four times a day (QID) | INTRAMUSCULAR | Status: DC | PRN
  Administered 2015-04-15 – 2015-04-16 (×2): 4 mg via INTRAVENOUS
  Filled 2015-04-14 (×3): qty 2

## 2015-04-14 MED ORDER — NICOTINE 14 MG/24HR TD PT24
14.0000 mg | MEDICATED_PATCH | Freq: Every day | TRANSDERMAL | Status: DC
  Administered 2015-04-14 – 2015-04-16 (×3): 14 mg via TRANSDERMAL
  Filled 2015-04-14 (×4): qty 1

## 2015-04-14 MED ORDER — CALCIUM ACETATE (PHOS BINDER) 667 MG PO CAPS
667.0000 mg | ORAL_CAPSULE | Freq: Three times a day (TID) | ORAL | Status: DC
  Administered 2015-04-14 – 2015-04-17 (×7): 667 mg via ORAL
  Filled 2015-04-14 (×8): qty 1

## 2015-04-14 MED ORDER — HYDRALAZINE HCL 20 MG/ML IJ SOLN
10.0000 mg | Freq: Once | INTRAMUSCULAR | Status: AC
  Administered 2015-04-14: 10 mg via INTRAVENOUS
  Filled 2015-04-14: qty 1

## 2015-04-14 MED ORDER — ONDANSETRON HCL 4 MG PO TABS
4.0000 mg | ORAL_TABLET | Freq: Four times a day (QID) | ORAL | Status: DC | PRN
  Filled 2015-04-14: qty 1

## 2015-04-14 MED ORDER — ACETAMINOPHEN 325 MG PO TABS
650.0000 mg | ORAL_TABLET | Freq: Four times a day (QID) | ORAL | Status: DC | PRN
  Administered 2015-04-14 – 2015-04-16 (×4): 650 mg via ORAL
  Filled 2015-04-14 (×5): qty 2

## 2015-04-14 MED ORDER — ADULT MULTIVITAMIN W/MINERALS CH
1.0000 | ORAL_TABLET | Freq: Every day | ORAL | Status: DC
  Administered 2015-04-14 – 2015-04-17 (×4): 1 via ORAL
  Filled 2015-04-14 (×4): qty 1

## 2015-04-14 MED ORDER — PNEUMOCOCCAL VAC POLYVALENT 25 MCG/0.5ML IJ INJ
0.5000 mL | INJECTION | INTRAMUSCULAR | Status: AC
  Administered 2015-04-16: 0.5 mL via INTRAMUSCULAR
  Filled 2015-04-14: qty 0.5

## 2015-04-14 MED ORDER — HYDRALAZINE HCL 25 MG PO TABS
50.0000 mg | ORAL_TABLET | Freq: Three times a day (TID) | ORAL | Status: DC
  Administered 2015-04-14 – 2015-04-17 (×9): 50 mg via ORAL
  Filled 2015-04-14 (×9): qty 2

## 2015-04-14 MED ORDER — SODIUM BICARBONATE 650 MG PO TABS
650.0000 mg | ORAL_TABLET | Freq: Two times a day (BID) | ORAL | Status: DC
  Administered 2015-04-14 – 2015-04-17 (×7): 650 mg via ORAL
  Filled 2015-04-14 (×7): qty 1

## 2015-04-14 MED ORDER — ENSURE ENLIVE PO LIQD
237.0000 mL | Freq: Two times a day (BID) | ORAL | Status: DC
  Administered 2015-04-14: 237 mL via ORAL

## 2015-04-14 MED ORDER — LABETALOL HCL 200 MG PO TABS
300.0000 mg | ORAL_TABLET | Freq: Two times a day (BID) | ORAL | Status: DC
  Administered 2015-04-14 – 2015-04-17 (×6): 300 mg via ORAL
  Filled 2015-04-14 (×7): qty 2

## 2015-04-14 MED ORDER — ALBUTEROL SULFATE (2.5 MG/3ML) 0.083% IN NEBU
2.5000 mg | INHALATION_SOLUTION | RESPIRATORY_TRACT | Status: DC | PRN
  Filled 2015-04-14: qty 3

## 2015-04-14 MED ORDER — DOCUSATE SODIUM 100 MG PO CAPS
100.0000 mg | ORAL_CAPSULE | Freq: Two times a day (BID) | ORAL | Status: DC
  Administered 2015-04-14 – 2015-04-17 (×7): 100 mg via ORAL
  Filled 2015-04-14 (×8): qty 1

## 2015-04-14 MED ORDER — NIFEDIPINE ER OSMOTIC RELEASE 30 MG PO TB24
90.0000 mg | ORAL_TABLET | Freq: Every day | ORAL | Status: DC
  Administered 2015-04-15 – 2015-04-17 (×3): 90 mg via ORAL
  Filled 2015-04-14 (×3): qty 3

## 2015-04-14 MED ORDER — METHADONE HCL 10 MG PO TABS
75.0000 mg | ORAL_TABLET | Freq: Every day | ORAL | Status: DC
  Administered 2015-04-15 – 2015-04-17 (×3): 75 mg via ORAL
  Filled 2015-04-14 (×3): qty 8

## 2015-04-14 MED ORDER — ACETAMINOPHEN 650 MG RE SUPP
650.0000 mg | Freq: Four times a day (QID) | RECTAL | Status: DC | PRN
  Filled 2015-04-14: qty 1

## 2015-04-14 MED ORDER — SODIUM CHLORIDE 0.9 % IV SOLN
INTRAVENOUS | Status: DC
  Administered 2015-04-14 – 2015-04-16 (×2): via INTRAVENOUS

## 2015-04-14 NOTE — Progress Notes (Signed)
Patient stated would like home medications locked up in pharmacy during stay. Home medications documented and sent to pharmacy, picked up by pharmacy tech. Donavan Foil, RN

## 2015-04-14 NOTE — ED Provider Notes (Signed)
CSN: [id number]     Arrival date & time 04/14/15  [id number] History  This chart was scribed for non-physician practitioner, Evalee Jefferson, PA-C working with Milton Ferguson, MD by Tula Nakayama, ED scribe. This patient was seen in room APA10/APA10 and the patient's care was started at 11:15 AM   Chief Complaint  Patient presents with  . Vascular Access Problem   The history is provided by the patient. No language interpreter was used.    HPI Comments: Terrance Reynolds is a 34 y.o. male with a history of HTN and recent acute-on-chronic renal failure requiring admission secondary to hypertensive crisis presenting for further evaluation of his kidney disease. Pt was initially referred to the ED 2 days ago by his nephrologist Dr. Hinda Lenis for possible catheter placement and dialysis after he measured elevated creatinine levels. Pt's lab work was stable in the ED and he was advised to return today for reevaluation by his nephrologist. Since his last vist to the ED 2 days ago, pt reports unchanged bilateral leg swelling. He also mild SOB that occurs with waking up and elevated BP that is worse at night. Pt is compliant with his BP medication. Pt denies CP or new symptoms.   Past Medical History  Diagnosis Date  . Psoriasis   . Hypertension   . Substance abuse     methadone clinic  . Anemia   . Thrombocytopenia   . Acute on chronic renal failure   . CKD (chronic kidney disease)    History reviewed. No pertinent past surgical history. History reviewed. No pertinent family history. History  Substance Use Topics  . Smoking status: Current Every Day Smoker  . Smokeless tobacco: Not on file  . Alcohol Use: No    Review of Systems  Constitutional: Negative for fever.  HENT: Negative for congestion and sore throat.   Eyes: Negative.   Respiratory: Positive for shortness of breath. Negative for chest tightness.   Cardiovascular: Positive for leg swelling. Negative for chest pain.  Gastrointestinal:  Negative for nausea and abdominal pain.  Genitourinary: Negative.   Musculoskeletal: Negative for joint swelling, arthralgias and neck pain.  Skin: Negative.  Negative for rash and wound.  Neurological: Negative for dizziness, weakness, light-headedness, numbness and headaches.  Psychiatric/Behavioral: Negative.     Allergies  Penicillins  Home Medications   Prior to Admission medications   Medication Sig Start Date End Date Taking? Authorizing Provider  aspirin 325 MG tablet Take 325-650 mg by mouth daily as needed for headache.   Yes Historical Provider, MD  hydrALAZINE (APRESOLINE) 50 MG tablet Take 1 tablet (50 mg total) by mouth every 8 (eight) hours. 04/07/15  Yes Lezlie Octave Black, NP  labetalol (NORMODYNE) 300 MG tablet Take 1 tablet (300 mg total) by mouth 2 (two) times daily. 04/07/15  Yes Radene Gunning, NP  Multiple Vitamin (MULTIVITAMIN WITH MINERALS) TABS tablet Take 1 tablet by mouth daily.   Yes Historical Provider, MD  NIFEdipine (PROCARDIA XL/ADALAT-CC) 90 MG 24 hr tablet Take 1 tablet (90 mg total) by mouth daily. 04/07/15  Yes Radene Gunning, NP  promethazine (PHENERGAN) 25 MG tablet Take 1 tablet (25 mg total) by mouth every 6 (six) hours as needed for nausea or vomiting. 04/12/15  Yes Evalee Jefferson, PA-C  calcium acetate (PHOSLO) 667 MG capsule Take 1 capsule (667 mg total) by mouth 3 (three) times daily with meals. 04/07/15   Kathie Dike, MD   BP 135/91 mmHg  Pulse 85  Temp(Src) 97.9 F (  36.6 C) (Oral)  Resp 12  Ht 6\' 2"  (1.88 m)  Wt 185 lb (83.915 kg)  BMI 23.74 kg/m2  SpO2 97% Physical Exam  Constitutional: He appears well-developed and well-nourished. No distress.  HENT:  Head: Normocephalic and atraumatic.  Eyes: Conjunctivae and EOM are normal.  Neck: Neck supple. No tracheal deviation present.  Cardiovascular: Normal rate, regular rhythm and normal heart sounds.   Pulmonary/Chest: Effort normal. No respiratory distress. He has rales.  Bilateral rales at lung  bases  Musculoskeletal: He exhibits edema.  Pitting ankle edema bilaterally  Skin: Skin is warm and dry.  Psychiatric: He has a normal mood and affect. His behavior is normal.  Nursing note and vitals reviewed.   ED Course  Procedures   DIAGNOSTIC STUDIES: Oxygen Saturation is 98% on RA, normal by my interpretation.    COORDINATION OF CARE: 11:27 AM Discussed lab work and treatment plan with pt which includes consultation with Dr. Hinda Lenis. Pt agreed to plan.  Labs Review Labs Reviewed  CBC WITH DIFFERENTIAL/PLATELET - Abnormal; Notable for the following:    RBC 3.74 (*)    Hemoglobin 11.1 (*)    HCT 32.2 (*)    Neutrophils Relative % 79 (*)    Lymphocytes Relative 10 (*)    All other components within normal limits  BASIC METABOLIC PANEL - Abnormal; Notable for the following:    Sodium 134 (*)    CO2 19 (*)    Glucose, Bld 139 (*)    BUN 80 (*)    Creatinine, Ser 7.41 (*)    Calcium 8.6 (*)    GFR calc non Af Amer 9 (*)    GFR calc Af Amer 10 (*)    All other components within normal limits    Imaging Review Dg Chest 2 View  04/14/2015   CLINICAL DATA:  High blood pressure, smoker, shortness of Breath  EXAM: CHEST  2 VIEW  COMPARISON:  04/12/2015  FINDINGS: Cardiomediastinal silhouette is stable. Mild thoracic dextroscoliosis again noted. Stable chronic bronchitic changes and mild interstitial prominence. No superimposed infiltrate or pulmonary edema.  IMPRESSION: Stable chronic bronchitic changes and mild interstitial prominence. No superimposed infiltrate or pulmonary edema. Mild thoracic dextroscoliosis.   Electronically Signed   By: Lahoma Crocker M.D.   On: 04/14/2015 10:42     EKG Interpretation None      MDM   Final diagnoses:  Renal failure    Pt with exam findings today suggesting increasing chf with bilateral rales, sob with first waking. Potassium stable.  Discussed with Dr Hinda Lenis who does want this pt admitted to the Hospitalists service, anticipating  line placement by interventional radiology (may need to transfer to [facility] for this procedure) then institution of dialysis here.  Discussed with Dr. Caryn Section.  Accepts for admission. Temp admission orders placed.   I personally performed the services described in this documentation, which was scribed in my presence. The recorded information has been reviewed and is accurate.   Evalee Jefferson, PA-C 04/14/15 1225  Milton Ferguson, MD 04/14/15 [phone]

## 2015-04-14 NOTE — Progress Notes (Signed)
Patient to have dialysis access placed at [facility]ology tomorrow per Dr. Caryn Section. Nursing secretary to notify and make sure arrangements in place. Nursing to monitor. Donavan Foil, RN

## 2015-04-14 NOTE — ED Notes (Signed)
Nurse to call back for report.  

## 2015-04-14 NOTE — Progress Notes (Signed)
Patient blood pressure remains elevated about 1 hour after receiving scheduled PO hydralazine and labetalol. Contacted midlevel. Received a one time order for 10mg  hydralazine iv. Will administer medication and continue to monitor.

## 2015-04-14 NOTE — H&P (Signed)
[facility] History and Physical  Terrance Reynolds [id number] DOB: February 04, 1981 DOA: 04/14/2015  Referring physician: ED PA, Evalee Jefferson PCP: Harriett Sine, MD   Chief Complaint: Worsening renal failure and need for hemodialysis.  HPI: Terrance Reynolds is a 34 y.o. male with a history of near end-stage renal disease, malignant hypertension, hepatitis C, substance abuse-on methadone maintenance therapy, and psoriasis, who presents to the emergency department after he was instructed by his nephrologist to come to the emergency department for treatment of worsening renal function. The patient was recently hospitalized and discharged last week for treatment of hypertensive crisis. During that time, he was found to have progressive acute on chronic renal failure. At the time of discharge, his creatinine was below 7. Nephrologist, Dr. Lowanda Foster follow the patient during the hospitalization. He recommended that the patient follow-up with him for a repeat of his renal function tests. Several days ago, the patient was called and was told that his renal function had gotten even worse and that he needed to come to the hospital for initiation of hemodialysis.  Patient has no complaints of shortness of breath at rest, but he does become more short of breath with activity. He denies orthopnea. He denies chest pain or pleurisy. He does acknowledge swelling in his legs which worsens when he stands. He has a mild global headache, but no dizziness or visual changes. He has had some nausea and vomiting on one occasion. He denies abdominal pain. He denies diarrhea or black tarry stools.  In the ED, he is afebrile and hemodynamically stable. His blood pressure is 146/85. His chest x-ray revealed stable chronic bronchitic changes and mild interstitial prominence with no superimposed infiltrate or pulmonary edema. His lab data are significant for a CO2 of 19, creatinine of 7.41, normal potassium of 4.4, glucose  of 139, phosphorus of 6.1, hemoglobin 11.1. He is being admitted for further evaluation and management.    Review of Systems:  Positive as above in history present illness. Otherwise review systems is negative.  Past Medical History  Diagnosis Date  . Psoriasis   . Hypertension   . Substance abuse     methadone clinic  . Anemia   . Thrombocytopenia   . Acute on chronic renal failure   . CKD (chronic kidney disease)   . Methadone maintenance therapy patient   . Diastolic dysfunction 0000000.    Grade 2 per echo   History reviewed. No pertinent past surgical history. Social History: He is single. He has no children. He is unemployed. He lives with a family friend, Mrs. Glennon Mac. He smokes a little less than a pack of cigarettes per day. He has a history of intravenous heroin and oxycodone use. He is on the methadone maintenance program, but he has relapsed a couple of times. He denies alcohol use.   Allergies  Allergen Reactions  . Penicillins Other (See Comments)    Child hood allergy   Family history: His biological mother is considered "healthy". His father died of a stroke.  Prior to Admission medications   Medication Sig Start Date End Date Taking? Authorizing Provider  aspirin 325 MG tablet Take 325-650 mg by mouth daily as needed for headache.   Yes Historical Provider, MD  hydrALAZINE (APRESOLINE) 50 MG tablet Take 1 tablet (50 mg total) by mouth every 8 (eight) hours. 04/07/15  Yes Lezlie Octave Black, NP  labetalol (NORMODYNE) 300 MG tablet Take 1 tablet (300 mg total) by mouth 2 (two) times daily. 04/07/15  Yes Radene Gunning, NP  methadone (DOLOPHINE) 10 MG tablet Take 75 mg by mouth daily.   Yes Historical Provider, MD  Multiple Vitamin (MULTIVITAMIN WITH MINERALS) TABS tablet Take 1 tablet by mouth daily.   Yes Historical Provider, MD  NIFEdipine (PROCARDIA XL/ADALAT-CC) 90 MG 24 hr tablet Take 1 tablet (90 mg total) by mouth daily. 04/07/15  Yes Radene Gunning, NP    promethazine (PHENERGAN) 25 MG tablet Take 1 tablet (25 mg total) by mouth every 6 (six) hours as needed for nausea or vomiting. 04/12/15  Yes Evalee Jefferson, PA-C  calcium acetate (PHOSLO) 667 MG capsule Take 1 capsule (667 mg total) by mouth 3 (three) times daily with meals. 04/07/15   Kathie Dike, MD   Physical Exam: Filed Vitals:   04/14/15 1017 04/14/15 1206 04/14/15 1310  BP: 123/70 135/91 146/85  Pulse: 85 85 89  Temp: 97.8 F (36.6 C) 97.9 F (36.6 C)   TempSrc: Oral Oral   Resp: 18 12 18   Height: 6\' 2"  (1.88 m)  6\' 2"  (1.88 m)  Weight: 83.915 kg (185 lb)  83.915 kg (185 lb)  SpO2: 98% 97% 97%    Wt Readings from Last 3 Encounters:  04/14/15 83.915 kg (185 lb)  04/12/15 86.183 kg (190 lb)  04/02/15 79.5 kg (175 lb 4.3 oz)    General:  Appears calm and comfortable; 34 year old Caucasian man in no acute distress. Eyes: PERRL, normal lids, irises & conjunctiva; conjunctivae are clear and sclerae are white. Extraocular rooms are intact. ENT: grossly normal hearing, lips & tongue; oropharynx with mildly dry mucous membranes. Neck: no LAD, masses or thyromegaly Cardiovascular: S1, S2, with a soft systolic murmur. 1+ pretibial edema and 1-2+ bilateral lower extremity pedal edema. Telemetry: SR, no arrhythmias  Respiratory: Occasional crackles auscultated in the bases, but no wheezes. Good air movement down to the bases. Normal respiratory effort; breathing nonlabored. Abdomen: Positive bowel sounds, soft, nontender, nondistended. Skin: no rash or induration seen on limited exam Musculoskeletal: grossly normal tone BUE/BLE; no acute hot red joints. Psychiatric: grossly normal mood and affect, speech fluent and appropriate Neurologic: grossly non-focal; cranial nerves II through XII are intact.           Labs on Admission:  Basic Metabolic Panel:  Recent Labs Lab 04/12/15 0941 04/14/15 1032  NA 134* 134*  K 4.5 4.4  CL 100* 102  CO2 21* 19*  GLUCOSE 118* 139*  BUN 77*  80*  CREATININE 7.58* 7.41*  CALCIUM 8.5* 8.6*  PHOS  --  6.1*   Liver Function Tests: No results for input(s): AST, ALT, ALKPHOS, BILITOT, PROT, ALBUMIN in the last 168 hours. No results for input(s): LIPASE, AMYLASE in the last 168 hours. No results for input(s): AMMONIA in the last 168 hours. CBC:  Recent Labs Lab 04/12/15 0941 04/14/15 1032  WBC 5.8 6.6  NEUTROABS 4.4 5.2  HGB 10.7* 11.1*  HCT 30.8* 32.2*  MCV 86.0 86.1  PLT 189 186   Cardiac Enzymes: No results for input(s): CKTOTAL, CKMB, CKMBINDEX, TROPONINI in the last 168 hours.  BNP (last 3 results)  Recent Labs  04/01/15 0847  BNP 447.0*    ProBNP (last 3 results) No results for input(s): PROBNP in the last 8760 hours.  CBG: No results for input(s): GLUCAP in the last 168 hours.  Radiological Exams on Admission: Dg Chest 2 View  04/14/2015   CLINICAL DATA:  High blood pressure, smoker, shortness of Breath  EXAM: CHEST  2 VIEW  COMPARISON:  04/12/2015  FINDINGS: Cardiomediastinal silhouette is stable. Mild thoracic dextroscoliosis again noted. Stable chronic bronchitic changes and mild interstitial prominence. No superimposed infiltrate or pulmonary edema.  IMPRESSION: Stable chronic bronchitic changes and mild interstitial prominence. No superimposed infiltrate or pulmonary edema. Mild thoracic dextroscoliosis.   Electronically Signed   By: Lahoma Crocker M.D.   On: 04/14/2015 10:42    EKG: Independently reviewed. Normal sinus rhythm with a heart rate of 85 bpm and prolonged QT interval 443/527.  Assessment/Plan Principal Problem:   Acute on chronic renal failure Active Problems:   Chronic kidney disease, stage V   Bilateral leg edema   Malignant hypertension   Diastolic dysfunction   Substance abuse   Metabolic acidosis   Pulmonary vascular congestion   Methadone maintenance therapy patient   1. Acute on chronic kidney disease. Patient's creatinine during the previous hospitalization was 6.73 at the  time of discharge. A couple days ago in the ED, it was 7.58. He returns with the creatinine of 7.41. The patient appears to have had stage V chronic kidney disease which has progressed to near end-stage renal disease. His potassium is within normal limits. He does not have pulmonary edema, but he does have lower extremity edema which can be attributed to both chronic diastolic dysfunction and progressive renal failure. The patient still makes urine on a regular basis. He does have some occasional nausea and vomiting which may be a consequence of mild uremia. The plan is to admit him to acquire access and then to start hemodialysis as discussed with his nephrologist Dr. Lowanda Foster. I will order gentle IV fluids just above KVO, but will defer diuretic treatment to Dr. Lowanda Foster. Will ask IR at [facility] to consult for a tunneled catheter. 2. Metabolic acidosis, secondary to chronic kidney disease. Will start sodium bicarbonate. 3. 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 Will restart PhosLo. 4. Malignant hypertension. The patient's blood pressure is reasonable currently. Will restart labetalol, hydralazine, and Procardia. 5. History of substance abuse on methadone maintenance therapy. Methadone will be continued cautiously. His QT interval is prolonged, which is likely secondary to methadone. We will continue to monitor him on telemetry. 6. Tobacco abuse. The patient was advised to stop smoking. Nicotine patch will be ordered. 7. Diastolic dysfunction. 2-D echocardiogram on 04/01/2015 revealed grade 2 diastolic dysfunction and an EF of 55%. Patient does not have pulmonary edema, but he does have lower extremity edema which he says is chronic and can be attributable to worsening renal disease. We'll defer start of diuretic therapy to nephrology. Otherwise, hemodialysis should treat the lower extremity edema.    Code Status: Full code DVT Prophylaxis: SCDs Family Communication: Discussed briefly with  family friend Mrs. Glennon Mac Disposition Plan: Discharge when clinically appropriate  Time spent: One hour  [facility] Pager [phone]

## 2015-04-14 NOTE — ED Notes (Signed)
Pt reports was sent here by Dr. Malka So to be admitted to have a vascular access device placed so pt can start dialysis. Pt denies any symptoms at this time.

## 2015-04-15 ENCOUNTER — Ambulatory Visit (HOSPITAL_COMMUNITY)
Admit: 2015-04-15 | Discharge: 2015-04-15 | Disposition: A | Discharge status: Home / Self Care | Payer: Medicaid Other | Attending: Internal Medicine | Admitting: Internal Medicine

## 2015-04-15 ENCOUNTER — Inpatient Hospital Stay (HOSPITAL_COMMUNITY): Payer: Medicaid Other

## 2015-04-15 ENCOUNTER — Encounter (HOSPITAL_COMMUNITY): Payer: Self-pay | Admitting: Radiology

## 2015-04-15 LAB — PROTIME-INR
INR: 1.08 (ref 0.00–1.49)
PROTHROMBIN TIME: 14.2 s (ref 11.6–15.2)

## 2015-04-15 LAB — CBC
HCT: 30.6 % — ABNORMAL LOW (ref 39.0–52.0)
HEMATOCRIT: 29.6 % — AB (ref 39.0–52.0)
Hemoglobin: 10.2 g/dL — ABNORMAL LOW (ref 13.0–17.0)
Hemoglobin: 10.6 g/dL — ABNORMAL LOW (ref 13.0–17.0)
MCH: 29.4 pg (ref 26.0–34.0)
MCH: 30 pg (ref 26.0–34.0)
MCHC: 34.5 g/dL (ref 30.0–36.0)
MCHC: 34.6 g/dL (ref 30.0–36.0)
MCV: 85.3 fL (ref 78.0–100.0)
MCV: 86.7 fL (ref 78.0–100.0)
Platelets: 171 10*3/uL (ref 150–400)
Platelets: 185 10*3/uL (ref 150–400)
RBC: 3.47 MIL/uL — ABNORMAL LOW (ref 4.22–5.81)
RBC: 3.53 MIL/uL — ABNORMAL LOW (ref 4.22–5.81)
RDW: 12.1 % (ref 11.5–15.5)
RDW: 11.9 % (ref 11.5–15.5)
WBC: 5.4 10*3/uL (ref 4.0–10.5)
WBC: 4.2 10*3/uL (ref 4.0–10.5)

## 2015-04-15 LAB — BASIC METABOLIC PANEL
Anion gap: 9 (ref 5–15)
BUN: 80 mg/dL — AB (ref 6–20)
CO2: 23 mmol/L (ref 22–32)
Calcium: 8.5 mg/dL — ABNORMAL LOW (ref 8.9–10.3)
Chloride: 104 mmol/L (ref 101–111)
Creatinine, Ser: 6.98 mg/dL — ABNORMAL HIGH (ref 0.61–1.24)
GFR calc Af Amer: 11 mL/min — ABNORMAL LOW (ref >60)
GFR, EST NON AFRICAN AMERICAN: 9 mL/min — AB (ref >60)
GLUCOSE: 93 mg/dL (ref 65–99)
Potassium: 3.9 mmol/L (ref 3.5–5.1)
Sodium: 136 mmol/L (ref 135–145)

## 2015-04-15 LAB — PHOSPHORUS: Phosphorus: 6.2 mg/dL — ABNORMAL HIGH (ref 2.5–4.6)

## 2015-04-15 LAB — APTT: APTT: 41 s — AB (ref 24–37)

## 2015-04-15 MED ORDER — VANCOMYCIN HCL IN DEXTROSE 1-5 GM/200ML-% IV SOLN
1000.0000 mg | Freq: Once | INTRAVENOUS | Status: DC
Start: 2015-04-15 — End: 2015-04-17
  Filled 2015-04-15 (×2): qty 200

## 2015-04-15 MED ORDER — NEPRO/CARBSTEADY PO LIQD: 237.0000 mL | ORAL | Status: DC | PRN

## 2015-04-15 MED ORDER — SODIUM CHLORIDE 0.9 % IJ SOLN
INTRAMUSCULAR | Status: AC
  Administered 2015-04-15: 10 mL via INTRAVENOUS
  Filled 2015-04-15: qty 12

## 2015-04-15 MED ORDER — LIDOCAINE-PRILOCAINE 2.5-2.5 % EX CREA: 1.0000 "application " | TOPICAL_CREAM | CUTANEOUS | Status: DC | PRN

## 2015-04-15 MED ORDER — HEPARIN SODIUM (PORCINE) 1000 UNIT/ML IJ SOLN
INTRAMUSCULAR | Status: AC
  Administered 2015-04-15: 4100 [IU] via INTRAVENOUS_CENTRAL
  Filled 2015-04-15: qty 6

## 2015-04-15 MED ORDER — SODIUM CHLORIDE 0.9 % IV SOLN: 100.0000 mL | INTRAVENOUS | Status: DC | PRN

## 2015-04-15 MED ORDER — EPOETIN ALFA 2000 UNIT/ML IJ SOLN: 2000.0000 [IU] | INTRAMUSCULAR | Status: DC

## 2015-04-15 MED ORDER — FENTANYL CITRATE (PF) 100 MCG/2ML IJ SOLN
INTRAMUSCULAR | Status: AC | PRN
  Administered 2015-04-15 (×2): 50 ug via INTRAVENOUS

## 2015-04-15 MED ORDER — EPOETIN ALFA 2000 UNIT/ML IJ SOLN
2000.0000 [IU] | INTRAMUSCULAR | Status: DC
  Administered 2015-04-15: 2000 [IU] via INTRAVENOUS
  Filled 2015-04-15 (×2): qty 1

## 2015-04-15 MED ORDER — LABETALOL HCL 5 MG/ML IV SOLN
10.0000 mg | Freq: Once | INTRAVENOUS | Status: AC
  Administered 2015-04-15: 10 mg via INTRAVENOUS
  Filled 2015-04-15: qty 4

## 2015-04-15 MED ORDER — LABETALOL HCL 5 MG/ML IV SOLN
5.0000 mg | INTRAVENOUS | Status: DC | PRN
  Filled 2015-04-15: qty 4

## 2015-04-15 MED ORDER — MIDAZOLAM HCL 2 MG/2ML IJ SOLN
INTRAMUSCULAR | Status: AC | PRN
  Administered 2015-04-15 (×2): 1 mg via INTRAVENOUS

## 2015-04-15 MED ORDER — LIDOCAINE HCL (PF) 1 % IJ SOLN: 5.0000 mL | INTRAMUSCULAR | Status: DC | PRN

## 2015-04-15 MED ORDER — HEPARIN SODIUM (PORCINE) 1000 UNIT/ML IJ SOLN
INTRAMUSCULAR | Status: AC
  Filled 2015-04-15: qty 1

## 2015-04-15 MED ORDER — FENTANYL CITRATE (PF) 100 MCG/2ML IJ SOLN
INTRAMUSCULAR | Status: AC
  Filled 2015-04-15: qty 4

## 2015-04-15 MED ORDER — EPOETIN ALFA 4000 UNIT/ML IJ SOLN
INTRAMUSCULAR | Status: AC
  Administered 2015-04-15: 2000 [IU]
  Filled 2015-04-15: qty 1

## 2015-04-15 MED ORDER — MIDAZOLAM HCL 2 MG/2ML IJ SOLN
INTRAMUSCULAR | Status: AC
  Filled 2015-04-15: qty 4

## 2015-04-15 MED ORDER — LIDOCAINE-EPINEPHRINE 1 %-1:100000 IJ SOLN
INTRAMUSCULAR | Status: AC
  Filled 2015-04-15: qty 1

## 2015-04-15 MED ORDER — SODIUM CHLORIDE 0.9 % IJ SOLN
10.0000 mL | INTRAMUSCULAR | Status: DC | PRN
  Administered 2015-04-15: 10 mL via INTRAVENOUS
  Filled 2015-04-15: qty 10

## 2015-04-15 MED ORDER — HYDRALAZINE HCL 20 MG/ML IJ SOLN
5.0000 mg | INTRAMUSCULAR | Status: DC | PRN
  Filled 2015-04-15: qty 0.25

## 2015-04-15 MED ORDER — HEPARIN SODIUM (PORCINE) 1000 UNIT/ML DIALYSIS
1000.0000 [IU] | INTRAMUSCULAR | Status: DC | PRN
  Administered 2015-04-15: 4100 [IU] via INTRAVENOUS_CENTRAL
  Filled 2015-04-15 (×2): qty 1

## 2015-04-15 MED ORDER — NEPRO/CARBSTEADY PO LIQD
237.0000 mL | ORAL | Status: DC
  Administered 2015-04-15 – 2015-04-16 (×2): 237 mL via ORAL

## 2015-04-15 MED ORDER — PENTAFLUOROPROP-TETRAFLUOROETH EX AERO: 1.0000 "application " | INHALATION_SPRAY | CUTANEOUS | Status: DC | PRN

## 2015-04-15 MED ORDER — LIDOCAINE HCL (PF) 1 % IJ SOLN
INTRAMUSCULAR | Status: AC
  Filled 2015-04-15: qty 30

## 2015-04-15 MED ORDER — ALTEPLASE 2 MG IJ SOLR
2.0000 mg | Freq: Once | INTRAMUSCULAR | Status: AC | PRN
  Filled 2015-04-15: qty 2

## 2015-04-15 NOTE — Progress Notes (Signed)
Initial Nutrition Assessment  DOCUMENTATION CODES: Not applicable  INTERVENTION: Nepro Shake po Daily, each supplement provides 425 kcal and 19 grams protein  Will f/u with patient tomorrow to assess need for education/ nutritional status  NUTRITION DIAGNOSIS: Increased nutrient needs related to initiation of HD as evidenced by estimated needs.  GOAL: Patient will meet greater than or equal to 90% of their needs  MONITOR: PO intake, Supplement acceptance, Labs, Weight trends, I & O's  REASON FOR ASSESSMENT: Malnutrition Screening Tool    ASSESSMENT: 34 y.o. male PMHx near end-stage renal disease, malignant hypertension, hepatitis C, substance abuse-on methadone maintenance therapy, and psoriasis, who presents to ED  for treatment of worsening renal function. Today received catheter for dialysis initiation.   Pt was out for procedure or with another discipline each time I visited. Will try again tommorrow if still admitted  Height: Ht Readings from Last 1 Encounters:  04/14/15 6\' 2"  (1.88 m)    Weight: Wt Readings from Last 1 Encounters:  04/15/15 176 lb 9.6 oz (80.105 kg)    Ideal Body Weight:  86.36 kg  Wt Readings from Last 10 Encounters:  04/15/15 176 lb 9.6 oz (80.105 kg)  04/12/15 190 lb (86.183 kg)  04/02/15 175 lb 4.3 oz (79.5 kg)  03/02/15 190 lb (86.183 kg)  12/25/14 185 lb (83.915 kg)  Appears to have lost ~10 lbs in 3 months  BMI:  Body mass index is 22.66 kg/(m^2).  Estimated Nutritional Needs: Kcal:  2100-2250 (26-28 kcal/kg) Protein:  96-112 g Pro (1.2-1.4 g) Fluid:  Per MD  Skin:  Mild psoriasis  Diet Order:  Diet Heart Room service appropriate?: Yes; Fluid consistency:: Thin  EDUCATION NEEDS: Education needs no appropriate at this time   Intake/Output Summary (Last 24 hours) at 04/15/15 1625 Last data filed at 04/15/15 1529  Gross per 24 hour  Intake 1034.67 ml  Output      0 ml  Net 1034.67 ml    Last BM:  6/27  Burtis Junes  RD, LDN Nutrition Pager: [phone] 04/15/2015 4:25 PM

## 2015-04-15 NOTE — Sedation Documentation (Signed)
Patient denies pain and is resting comfortably.  

## 2015-04-15 NOTE — Progress Notes (Signed)
Patient has a blood pressure of 186/110. Contacted hospitalist. Order received for a one time dose of iv labetalol. Will continue to monitor.

## 2015-04-15 NOTE — Care Management Note (Signed)
Case Management Note  Patient Details  Name: THOS ZINTER MRN: [id number] Date of Birth: 1981/08/21  Subjective/Objective:                  Pt admitted from home with end stage renal failure to start dialysis. Pt lives with his mom and will return home at discharge. Pt is independent with ADL's.Pt was arranged for PCP care at the [facility] on last discharge.  Action/Plan: CSW is aware that pt will need to have outpt dialysis arranged. Will continue to follow for discharge planning needs.  Expected Discharge Date:                  Expected Discharge Plan:  Home/Self Care  In-House Referral:  Clinical Social Work  Discharge planning Services  CM Consult  Post Acute Care Choice:  NA Choice offered to:  NA  DME Arranged:    DME Agency:     HH Arranged:    HH Agency:     Status of Service:  In process, will continue to follow  Medicare Important Message Given:    Date Medicare IM Given:    Medicare IM give by:    Date Additional Medicare IM Given:    Additional Medicare Important Message give by:     If discussed at [place] of Stay Meetings, dates discussed:    Additional Comments:  Joylene Draft, RN 04/15/2015, 11:17 AM

## 2015-04-15 NOTE — Progress Notes (Signed)
[facility] PROGRESS NOTE  EPPIE PULE [id number] DOB: Jan 05, 1981 DOA: 04/14/2015 PCP: Harriett Sine, MD    Code Status: Full code Family Communication: family not available, discussed with patient. Disposition Plan: Discharge when clinically appropriate.   Consultants:  Nephrology  Interventional radiology  Procedures:  Scheduled tunneled catheter 04/15/15, by IR at [facility].  Antibiotics:  None  HPI/Subjective: Patient has no complaints of chest pain or shortness of breath. The swelling in his legs have not increased.  Objective: Filed Vitals:   04/15/15 1042  BP: 142/90  Pulse: 81  Temp: 98 F (36.7 C)  Resp: 18   oxygen saturation 98% on room air.  Intake/Output Summary (Last 24 hours) at 04/15/15 1124 Last data filed at 04/15/15 0650  Gross per 24 hour  Intake 674.67 ml  Output      0 ml  Net 674.67 ml   Filed Weights   04/14/15 1017 04/14/15 1310 04/15/15 0656  Weight: 83.915 kg (185 lb) 83.915 kg (185 lb) 80.105 kg (176 lb 9.6 oz)    Exam:   General:  Alert 34 year old Caucasian man in no acute distress.  Cardiovascular: S1, S2, with a soft systolic murmur.  Respiratory: Decreased breath sound in the bases, with no appreciable audible wheezes or crackles.  Abdomen: Positive bowel sounds, soft, nontender nondistended.  Musculoskeletal/extremities: 1+ bilateral lower extremity edema, pitting.  Neurologic: He is alert and oriented 3. Cranial nerves II through XII are intact.   Data Reviewed: Basic Metabolic Panel:  Recent Labs Lab 04/12/15 0941 04/14/15 1032 04/15/15 0628  NA 134* 134* 136  K 4.5 4.4 3.9  CL 100* 102 104  CO2 21* 19* 23  GLUCOSE 118* 139* 93  BUN 77* 80* 80*  CREATININE 7.58* 7.41* 6.98*  CALCIUM 8.5* 8.6* 8.5*  PHOS  --  6.1* 6.2*   Liver Function Tests: No results for input(s): AST, ALT, ALKPHOS, BILITOT, PROT, ALBUMIN in the last 168 hours. No results for input(s): LIPASE, AMYLASE in the  last 168 hours. No results for input(s): AMMONIA in the last 168 hours. CBC:  Recent Labs Lab 04/12/15 0941 04/14/15 1032 04/15/15 0628  WBC 5.8 6.6 5.4  NEUTROABS 4.4 5.2  --   HGB 10.7* 11.1* 10.2*  HCT 30.8* 32.2* 29.6*  MCV 86.0 86.1 85.3  PLT 189 186 171   Cardiac Enzymes: No results for input(s): CKTOTAL, CKMB, CKMBINDEX, TROPONINI in the last 168 hours. BNP (last 3 results)  Recent Labs  04/01/15 0847  BNP 447.0*    ProBNP (last 3 results) No results for input(s): PROBNP in the last 8760 hours.  CBG: No results for input(s): GLUCAP in the last 168 hours.  No results found for this or any previous visit (from the past 240 hour(s)).   Studies: Dg Chest 2 View  04/14/2015   CLINICAL DATA:  High blood pressure, smoker, shortness of Breath  EXAM: CHEST  2 VIEW  COMPARISON:  04/12/2015  FINDINGS: Cardiomediastinal silhouette is stable. Mild thoracic dextroscoliosis again noted. Stable chronic bronchitic changes and mild interstitial prominence. No superimposed infiltrate or pulmonary edema.  IMPRESSION: Stable chronic bronchitic changes and mild interstitial prominence. No superimposed infiltrate or pulmonary edema. Mild thoracic dextroscoliosis.   Electronically Signed   By: Lahoma Crocker M.D.   On: 04/14/2015 10:42    Scheduled Meds: . calcium acetate  667 mg Oral TID WC  . docusate sodium  100 mg Oral BID  . feeding supplement (ENSURE ENLIVE)  237 mL Oral BID BM  .  hydrALAZINE  50 mg Oral 3 times per day  . labetalol  300 mg Oral BID  . methadone  75 mg Oral Daily  . multivitamin with minerals  1 tablet Oral Daily  . nicotine  14 mg Transdermal Daily  . NIFEdipine  90 mg Oral Daily  . pneumococcal 23 valent vaccine  0.5 mL Intramuscular Tomorrow-1000  . sodium bicarbonate  650 mg Oral BID   Continuous Infusions: . sodium chloride 40 mL/hr at 04/14/15 1358   Assessment and plan:  Principal Problem:   Acute on chronic renal failure Active Problems:   Chronic  kidney disease, stage V   Bilateral leg edema   Malignant hypertension   Diastolic dysfunction   Substance abuse   Metabolic acidosis   Pulmonary vascular congestion   Methadone maintenance therapy patient  Acute on chronic kidney disease.  Patient's creatinine during the previous hospitalization was 6.73 at the time of discharge. A couple days ago in the ED, it was 7.58. He returned with the creatinine of 7.41. 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 His potassium is within normal limits. He does not have pulmonary edema, but he does have lower extremity edema which can be attributed to both chronic diastolic dysfunction and progressive renal failure.  -The patient was discussed with nephrologist Dr. Lowanda Foster. The plan is to acquire HD access by getting a tunneled catheter placed at [facility] by IR today. Subsequently, the patient will be dialyzed. -With very gentle IV fluids, his creatinine has improved a little since admission.  Metabolic acidosis, secondary to chronic kidney disease.  Patient CO2 was 19 on admission. He was restarted on sodium bicarbonate tablets with improvement.  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 PhosLo was restarted.  Malignant hypertension. The patient's blood pressure is notoriously malignant. Starting hemodialysis will help. He was restarted on labetalol, hydralazine, and Procardia. Added when necessary labetalol and hydralazine.  History of substance abuse on methadone maintenance therapy. Methadone was continued cautiously. His QT interval is prolonged, which is likely secondary to methadone. We will continue to monitor him on telemetry.  Tobacco abuse. The patient was advised to stop smoking. Nicotine patch was ordered.  Diastolic dysfunction. 2-D echocardiogram on 04/01/2015 revealed grade 2 diastolic dysfunction and an EF of 55%.  Patient does not have pulmonary edema, but he does have  lower extremity edema which he says is chronic and can be attributable to worsening renal disease. We'll defer start of diuretic therapy to nephrology. Otherwise, hemodialysis should treat the lower extremity edema.  Time spent: 35 minutes    [facility] Pager [phone]. If 7PM-7AM, please contact night-coverage at [email or web address], password [facility] 04/15/2015, 11:24 AM  LOS: 1 day

## 2015-04-15 NOTE — Procedures (Signed)
Successful placement of tunneled HD catheter with tips terminating within the superior aspect of the right atrium.   The patient tolerated the procedure well without immediate post procedural complication. The catheter is ready for immediate use.

## 2015-04-15 NOTE — Consult Note (Signed)
Chief Complaint: Chief Complaint  Patient presents with  . Vascular Access Problem  acute on chronic renal failure  Referring Physician(s): Dr Hinda Lenis [facility]  History of Present Illness: Terrance Reynolds is a 34 y.o. male   Stage 4 renal failure Extr edema; HTN Fatigue Hx substance abuse and now methadone maintenance Worsening real functions A/C renal failure Request for tunneled dialysis catheter placement To start dialysis asap  Past Medical History  Diagnosis Date  . Psoriasis   . Hypertension   . Substance abuse     methadone clinic  . Anemia   . Thrombocytopenia   . Acute on chronic renal failure   . CKD (chronic kidney disease)   . Methadone maintenance therapy patient   . Diastolic dysfunction 0000000.    Grade 2 per echo    History reviewed. No pertinent past surgical history.  Allergies: Penicillins  Medications: Prior to Admission medications   Medication Sig Start Date End Date Taking? Authorizing Provider  aspirin 325 MG tablet Take 325-650 mg by mouth daily as needed for headache.   Yes Historical Provider, MD  hydrALAZINE (APRESOLINE) 50 MG tablet Take 1 tablet (50 mg total) by mouth every 8 (eight) hours. 04/07/15  Yes Lezlie Octave Black, NP  labetalol (NORMODYNE) 300 MG tablet Take 1 tablet (300 mg total) by mouth 2 (two) times daily. 04/07/15  Yes Lezlie Octave Black, NP  methadone (DOLOPHINE) 10 MG tablet Take 75 mg by mouth daily.   Yes Historical Provider, MD  Multiple Vitamin (MULTIVITAMIN WITH MINERALS) TABS tablet Take 1 tablet by mouth daily.   Yes Historical Provider, MD  NIFEdipine (PROCARDIA XL/ADALAT-CC) 90 MG 24 hr tablet Take 1 tablet (90 mg total) by mouth daily. 04/07/15  Yes Radene Gunning, NP  promethazine (PHENERGAN) 25 MG tablet Take 1 tablet (25 mg total) by mouth every 6 (six) hours as needed for nausea or vomiting. 04/12/15  Yes Evalee Jefferson, PA-C  calcium acetate (PHOSLO) 667 MG capsule Take 1 capsule (667 mg total) by mouth 3 (three)  times daily with meals. 04/07/15   Kathie Dike, MD     History reviewed. No pertinent family history.  History   Social History  . Marital Status: Single    Spouse Name: N/A  . Number of Children: N/A  . Years of Education: N/A   Social History Main Topics  . Smoking status: Current Every Day Smoker -- 0.50 packs/day  . Smokeless tobacco: Not on file  . Alcohol Use: No  . Drug Use: No     Comment: former IV drug user  . Sexual Activity: Not on file   Other Topics Concern  . None   Social History Narrative     Review of Systems: A 12 point ROS discussed and pertinent positives are indicated in the HPI above.  All other systems are negative.  Review of Systems  Constitutional: Positive for fatigue. Negative for fever and activity change.  Respiratory: Negative for shortness of breath.   Gastrointestinal: Negative for abdominal pain.  Genitourinary: Negative for difficulty urinating.  Musculoskeletal: Negative for back pain.  Neurological: Positive for weakness.  Psychiatric/Behavioral: Negative for behavioral problems and confusion.    Vital Signs: BP 133/89 mmHg  Pulse 73  Temp(Src) 98 F (36.7 C) (Oral)  Resp 18  Ht 6\' 2"  (1.88 m)  Wt 176 lb 9.6 oz (80.105 kg)  BMI 22.66 kg/m2  SpO2 100%  Physical Exam  Constitutional: He is oriented to person, place, and time.  He appears well-developed and well-nourished.  Cardiovascular: Normal rate and regular rhythm.   No murmur heard. Pulmonary/Chest: Effort normal and breath sounds normal. He has no wheezes.  Abdominal: Soft. Bowel sounds are normal. He exhibits no distension.  Musculoskeletal: Normal range of motion.  Neurological: He is alert and oriented to person, place, and time.  Skin: Skin is warm and dry.  Psychiatric: He has a normal mood and affect. His behavior is normal. Judgment and thought content normal.  Nursing note and vitals reviewed.   Mallampati Score:  MD Evaluation Airway: WNL Heart:  WNL Abdomen: WNL Chest/ Lungs: WNL ASA  Classification: 3 Mallampati/Airway Score: One  Imaging: Dg Chest 2 View  04/14/2015   CLINICAL DATA:  High blood pressure, smoker, shortness of Breath  EXAM: CHEST  2 VIEW  COMPARISON:  04/12/2015  FINDINGS: Cardiomediastinal silhouette is stable. Mild thoracic dextroscoliosis again noted. Stable chronic bronchitic changes and mild interstitial prominence. No superimposed infiltrate or pulmonary edema.  IMPRESSION: Stable chronic bronchitic changes and mild interstitial prominence. No superimposed infiltrate or pulmonary edema. Mild thoracic dextroscoliosis.   Electronically Signed   By: Lahoma Crocker M.D.   On: 04/14/2015 10:42   Dg Chest 2 View  04/12/2015   CLINICAL DATA:  Shortness of breath, peripheral edema, hypertension, renal failure  EXAM: CHEST  2 VIEW  COMPARISON:  04/01/2015  FINDINGS: Normal heart size and vascularity. No focal pneumonia, collapse or consolidation. No edema, effusion, or pneumothorax. Trachea midline. Stable mild upper thoracic scoliosis. Mild central bronchitic change and interstitial prominence, nonspecific.  IMPRESSION: Stable bronchitic change and interstitial prominence. No superimposed acute process.   Electronically Signed   By: Jerilynn Mages.  Shick M.D.   On: 04/12/2015 10:08   X-ray Chest Pa And Lateral  04/01/2015   CLINICAL DATA:  One day history of shortness of breath  EXAM: CHEST  2 VIEW  COMPARISON:  None.  FINDINGS: There is no edema or consolidation. The heart size and pulmonary vascularity are normal. No adenopathy. No bone lesions.  IMPRESSION: No edema or consolidation.   Electronically Signed   By: Lowella Grip III M.D.   On: 04/01/2015 15:25   Ct Head Wo Contrast  04/04/2015   CLINICAL DATA:  Patient woke up with severe headache in elevated blood pressure.  EXAM: CT HEAD WITHOUT CONTRAST  TECHNIQUE: Contiguous axial images were obtained from the base of the skull through the vertex without intravenous contrast.   COMPARISON:  04/01/2015  FINDINGS: Intracranial contents are symmetrical. No ventricular dilatation. No mass effect or midline shift. No abnormal extra-axial fluid collections. Gray-white matter junctions are distinct. Basal cisterns are not effaced. No evidence of acute intracranial hemorrhage. No depressed skull fractures. Mucosal thickening in the paranasal sinuses. Mastoid air cells are not opacified.  IMPRESSION: No acute intracranial abnormalities.   Electronically Signed   By: Lucienne Capers M.D.   On: 04/04/2015 02:04   Ct Head Wo Contrast  04/01/2015   CLINICAL DATA:  Headache for 2 days with nausea and vomiting and blurred vision.  EXAM: CT HEAD WITHOUT CONTRAST  TECHNIQUE: Contiguous axial images were obtained from the base of the skull through the vertex without intravenous contrast.  COMPARISON:  12/25/2014  FINDINGS: No mass lesion. No midline shift. No acute hemorrhage or hematoma. No extra-axial fluid collections. No evidence of acute infarction. Brain parenchyma is normal. Osseous structures are normal.  IMPRESSION: Normal exam.   Electronically Signed   By: Lorriane Shire M.D.   On: 04/01/2015 13:57  US Abdomen Complete  04/03/2015   CLINICAL DATA:  Hepatitis C  EXAM: ULTRASOUND ABDOMEN COMPLETE  COMPARISON:  None.  FINDINGS: Gallbladder: No gallstones, gallbladder wall thickening, or pericholecystic fluid. Negative sonographic Murphy's sign.  Common bile duct: Diameter: 5 mm  Liver: No focal lesion identified. Within normal limits in parenchymal echogenicity.  IVC: No abnormality visualized.  Pancreas: Visualized portion unremarkable. Two peripancreatic nodes measuring up to 13 mm short axis.  Spleen: Size and appearance within normal limits.  Right Kidney: Length: 11.0 cm. Echogenic renal parenchyma. No mass or hydronephrosis.  Left Kidney: Length: 11.1 cm. Echogenic renal parenchyma. No mass or hydronephrosis.  Abdominal aorta: No aneurysm visualized.  Other findings: None.  IMPRESSION:  Echogenic renal parenchyma, suggesting medical renal disease. No hydronephrosis.  Otherwise negative abdominal ultrasound.   Electronically Signed   By: Julian Hy M.D.   On: 04/03/2015 11:52   US Renal  04/01/2015   CLINICAL DATA:  Increased creatinine  EXAM: RENAL / URINARY TRACT ULTRASOUND COMPLETE  COMPARISON:  None.  FINDINGS: Right Kidney:  Length: 11 cm.  Echogenic cortex.  No hydronephrosis or mass lesion  Left Kidney:  Length: 11 cm.  Echogenic cortex.  No hydronephrosis or mass lesion  Bladder:  Appears normal for degree of bladder distention. Patent bilateral ureters with jets.  IMPRESSION: 1. Medical renal disease without atrophy. 2. No hydronephrosis.   Electronically Signed   By: Monte Fantasia M.D.   On: 04/01/2015 16:13    Labs:  CBC:  Recent Labs  04/05/15 0624 04/12/15 0941 04/14/15 1032 04/15/15 0628  WBC 5.6 5.8 6.6 5.4  HGB 10.6* 10.7* 11.1* 10.2*  HCT 31.6* 30.8* 32.2* 29.6*  PLT 126* 189 186 171    COAGS:  Recent Labs  04/03/15 0604 04/15/15 0827  INR 1.06 1.08  APTT  --  41*    BMP:  Recent Labs  04/07/15 0616 04/12/15 0941 04/14/15 1032 04/15/15 0628  NA 136 134* 134* 136  K 3.8 4.5 4.4 3.9  CL 104 100* 102 104  CO2 22 21* 19* 23  GLUCOSE 98 118* 139* 93  BUN 59* 77* 80* 80*  CALCIUM 8.3* 8.5* 8.6* 8.5*  CREATININE 6.73* 7.58* 7.41* 6.98*  GFRNONAA 10* 8* 9* 9*  GFRAA 11* 10* 10* 11*    LIVER FUNCTION TESTS:  Recent Labs  04/01/15 0847 04/02/15 0425 04/07/15 0616  BILITOT 0.6 0.7 0.5  AST 35 24 25  ALT 46 35 38  ALKPHOS 92 75 66  PROT 6.7 5.9* 6.1*  ALBUMIN 3.4* 3.0* 3.1*    TUMOR MARKERS: No results for input(s): AFPTM, CEA, CA199, CHROMGRNA in the last 8760 hours.  Assessment and Plan:  Continued worsening renal functions Now for dialysis catheter placement Risks and Benefits discussed with the patient including, but not limited to bleeding, infection, vascular injury, pneumothorax which may require chest  tube placement, air embolism or even death All of the patient's questions were answered, patient is agreeable to proceed. Consent signed and in chart.   Thank you for this interesting consult.  I greatly enjoyed meeting Terrance Reynolds and look forward to participating in their care.  Signed: Onnika Siebel A 04/15/2015, 12:39 PM   I spent a total of 20 Minutes    in face to face in clinical consultation, greater than 50% of which was counseling/coordinating care for HD tunneled catheter

## 2015-04-15 NOTE — Clinical Social Work Note (Signed)
Clinical Social Work Assessment  Patient Details  Name: Terrance Reynolds MRN: [id number] Date of Birth: 1981/10/02  Date of referral:  04/15/15               Reason for consult:  Other (Comment Required) (outpatient dialysis)                Permission sought to share information with:    Permission granted to share information::     Name::        Agency::     Relationship::     Contact Information:     Housing/Transportation Living arrangements for the past 2 months:  Single Family Home Source of Information:  Patient Patient Interpreter Needed:  None Criminal Activity/Legal Involvement Pertinent to Current Situation/Hospitalization:  No - Comment as needed Significant Relationships:  Other Family Members Lives with:  Parents Do you feel safe going back to the place where you live?  Yes Need for family participation in patient care:  Yes (Comment)  Care giving concerns:  Pt is independent with ADLs.    Social Worker assessment / plan:  CSW met with pt at bedside. Pt alert and oriented and reports he lives with his stepmom, Terrance Reynolds who he said is his best support. Pt moved back in with her when he started getting sick several months ago. Pt was working in [employer] until March. He shared that it has been a hard adjustment not being able to work. He is interested in applying for disability. CSW discussed with Development worker, community. CSW discussed outpatient dialysis with pt. He is going for dialysis catheter placement today. Pt indicates he has transportation to treatment. CSW has spoken with Terrance Reynolds at [facility] and awaiting return call in order to start referral.   CSW discussed pt's substance use with him as documented in H&P. He admits to previously using IV oxycodone. Pt has been on a methadone maintenance program for the past 2 1/2 months and reports no other substance use during this time.   Employment status:  Unemployed Forensic scientist:  Self Pay (Medicaid  Pending) PT Recommendations:  Not assessed at this time Information / Referral to community resources:  Other (Comment Required) ([place])  Patient/Family's Response to care:  Pt may consider Terrance Reynolds for dialysis in the future, but is agreeable to [facility] for now.   Patient/Family's Understanding of and Emotional Response to Diagnosis, Current Treatment, and Prognosis:  Pt reports he was aware that he would require dialysis at some point. He indicates he is feeling somewhat nervous about this as he does not know what to expect. CSW provided support.   Emotional Assessment Appearance:  Appears stated age Attitude/Demeanor/Rapport:  Other (Cooperative) Affect (typically observed):  Anxious Orientation:  Oriented to Self, Oriented to Place, Oriented to  Time, Oriented to Situation Alcohol / Substance use:  Illicit Drugs Psych involvement (Current and /or in the community):     Discharge Needs  Concerns to be addressed:  Substance Abuse Concerns, Other (Comment Required (setting up outpatient dialysis) Readmission within the last 30 days:  Yes Current discharge risk:  None Barriers to Discharge:  Continued Medical Work up   [employer], [employer], [place] 04/15/2015, 11:24 AM [phone]

## 2015-04-15 NOTE — Progress Notes (Signed)
Patient ID: Terrance Reynolds, male   DOB: 06-Jan-1981, 34 y.o.   MRN: [id number]   Aware of HD cat placement request  Plan for IR procedure today Pt to be at [place] today---via ambulance  Will return to [facility] after procedure

## 2015-04-15 NOTE — Consult Note (Signed)
Terrance Reynolds MRN: [id number] DOB/AGE: [birth year] 34 y.o. Primary Care Physician:Terrance S, MD Admit date: 04/14/2015 Chief Complaint:  Chief Complaint  Patient presents with  . Vascular Access Problem   HPI: Pt  is a 34 y.o. male with past medical hx of  HTN who presented  to ER with chief complaint of need for dialysis.  HPI dates back to past to  3 weeks ago when pt was admitted for malignant HTN. Pt was traeted as into and workup done. Pt later was d/ced home as pt creat stabilized. Pt creat is now high and pt needs renal replacement therapy. Pt offers no c/o chest pain Pt main concern is pain at the tunneled cath site.  no c/o dyspnea NO c/o diarrhera NO c/o coffee ground emesis No c/o fever/ chills/ cough.  NO c/o change in speech/vision. NO c/o  abdominal pain / constipation.  Past Medical History  Diagnosis Date  . Psoriasis   . Hypertension   . Substance abuse     methadone clinic  . Anemia   . Thrombocytopenia   . Acute on chronic renal failure   . CKD (chronic kidney disease)   . Methadone maintenance therapy patient   . Diastolic dysfunction 0000000.    Grade 2 per echo      History reviewed. No pertinent family history. NO hx of ESRD  Social History:  reports that he has been smoking.  He does not have any smokeless tobacco history on file. He reports that he does not drink alcohol or use illicit drugs.   Allergies:  Allergies  Allergen Reactions  . Penicillins Other (See Comments)    Child hood allergy    Medications Prior to Admission  Medication Sig Dispense Refill  . aspirin 325 MG tablet Take 325-650 mg by mouth daily as needed for headache.    . hydrALAZINE (APRESOLINE) 50 MG tablet Take 1 tablet (50 mg total) by mouth every 8 (eight) hours. 90 tablet 1  . labetalol (NORMODYNE) 300 MG tablet Take 1 tablet (300 mg total) by mouth 2 (two) times daily. 60 tablet 1  . methadone (DOLOPHINE) 10 MG tablet Take 75 mg by mouth daily.     . Multiple Vitamin (MULTIVITAMIN WITH MINERALS) TABS tablet Take 1 tablet by mouth daily.    [name] NIFEdipine (PROCARDIA XL/ADALAT-CC) 90 MG 24 hr tablet Take 1 tablet (90 mg total) by mouth daily. 60 tablet 0  . promethazine (PHENERGAN) 25 MG tablet Take 1 tablet (25 mg total) by mouth every 6 (six) hours as needed for nausea or vomiting. 30 tablet 0  . calcium acetate (PHOSLO) 667 MG capsule Take 1 capsule (667 mg total) by mouth 3 (three) times daily with meals. 90 capsule 1       [id number] from the symptoms mentioned above,there are no other symptoms referable to all systems reviewed.  . calcium acetate  667 mg Oral TID WC  . docusate sodium  100 mg Oral BID  . feeding supplement (ENSURE ENLIVE)  237 mL Oral BID BM  . hydrALAZINE  50 mg Oral 3 times per day  . labetalol  300 mg Oral BID  . methadone  75 mg Oral Daily  . multivitamin with minerals  1 tablet Oral Daily  . nicotine  14 mg Transdermal Daily  . NIFEdipine  90 mg Oral Daily  . pneumococcal 23 valent vaccine  0.5 mL Intramuscular Tomorrow-1000  . sodium bicarbonate  650 mg Oral BID  . vancomycin  1,000 mg  Intravenous Once     Physical Exam: Vital signs in last 24 hours: Temp:  [98 F (36.7 C)-98.3 F (36.8 C)] 98.1 F (36.7 C) (06/28 1528) Pulse Rate:  [73-92] 79 (06/28 1528) Resp:  [10-18] 16 (06/28 1528) BP: (133-186)/(81-117) 134/81 mmHg (06/28 1528) SpO2:  [93 %-100 %] 97 % (06/28 1528) Weight:  [176 lb 9.6 oz (80.105 kg)] 176 lb 9.6 oz (80.105 kg) (06/28 0656) Weight change:  Last BM Date: 04/14/15  Intake/Output from previous day: 06/27 0701 - 06/28 0700 In: 674.7 [I.V.:674.7] Out: -  Total I/O In: 360 [P.O.:360] Out: -    Physical Exam: General- pt is awake,alert, oriented to time place and person Resp- No acute REsp distress, CTA B/L NO Rhonchi CVS- S1S2 regular in rate and rhythm GIT- BS+, soft, NT, ND EXT- trace lE Edema, Cyanosis CNS- CN 2-12 grossly intact. Moving all 4  extremities Psych- normal mood and affect    Lab Results: CBC  Recent Labs  04/14/15 1032 04/15/15 0628  WBC 6.6 5.4  HGB 11.1* 10.2*  HCT 32.2* 29.6*  PLT 186 171   trend Hgb 15=>11.5=>10.2   BMET  Recent Labs  04/14/15 1032 04/15/15 0628  NA 134* 136  K 4.4 3.9  CL 102 104  CO2 19* 23  GLUCOSE 139* 93  BUN 80* 80*  CREATININE 7.41* 6.98*  CALCIUM 8.6* 8.5*   Creat  2.42=>6.98--7.41  MICRO No results found for this or any previous visit (from the past 240 hour(Reynolds)).    Lab Results  Component Value Date   PTH 118* 04/02/2015   PTH Comment 04/02/2015   CALCIUM 8.5* 04/15/2015   PHOS 6.2* 04/15/2015      Impression: 1)Renal  CKD stage 5 .               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 Phosphorus high Calcium at goal.   5)Substance abuse- Pt gave hx of using illegal drugs  6)Electrolytes Normokalemic NOrmonatremic  7)Acid base Co2 at goal     Plan:  Will initiate HD  Will dialyze for 2 hrs  will not use heparin as procedure done today Will keep on epo   Terrance Reynolds 04/15/2015, 3:47 PM

## 2015-04-15 NOTE — Procedures (Signed)
  INITIAL HEMODIALYSIS TREATMENT NOTE:  First ever HD treatment initiated via right IJ tunneled catheter after placement verified.  No fluid removal / kept even per MD order. All blood was reinfused. Tolerated 2 hour session without complications.  Discussed basic renal physiology, indications/risks/benefits of HD, and basic infection control practices.  Report called to [place], Therapist, sports.  [place]well [place], RN, CDN

## 2015-04-15 NOTE — Progress Notes (Signed)
Patient transported by [facility] to [facility] Radiology Department.

## 2015-04-15 NOTE — Progress Notes (Signed)
UR chart review completed.  

## 2015-04-15 NOTE — Clinical Social Work Note (Signed)
Referral faxed to [facility] at [facility] who confirms information received.   Terrance Reynolds, [place]

## 2015-04-15 NOTE — Progress Notes (Signed)
Patient return from [facility] via [place]. Hemodialysis catheter noted right upper chest area.

## 2015-04-16 DIAGNOSIS — R600 Localized edema: Secondary | ICD-10-CM

## 2015-04-16 DIAGNOSIS — N179 Acute kidney failure, unspecified: Principal | ICD-10-CM

## 2015-04-16 DIAGNOSIS — I10 Essential (primary) hypertension: Secondary | ICD-10-CM

## 2015-04-16 DIAGNOSIS — I519 Heart disease, unspecified: Secondary | ICD-10-CM

## 2015-04-16 DIAGNOSIS — N185 Chronic kidney disease, stage 5: Secondary | ICD-10-CM

## 2015-04-16 DIAGNOSIS — N189 Chronic kidney disease, unspecified: Secondary | ICD-10-CM

## 2015-04-16 LAB — RENAL FUNCTION PANEL
ALBUMIN: 3.6 g/dL (ref 3.5–5.0)
ANION GAP: 9 (ref 5–15)
BUN: 59 mg/dL — ABNORMAL HIGH (ref 6–20)
CO2: 25 mmol/L (ref 22–32)
Calcium: 8.7 mg/dL — ABNORMAL LOW (ref 8.9–10.3)
Chloride: 102 mmol/L (ref 101–111)
Creatinine, Ser: 5.83 mg/dL — ABNORMAL HIGH (ref 0.61–1.24)
GFR calc Af Amer: 13 mL/min — ABNORMAL LOW (ref >60)
GFR, EST NON AFRICAN AMERICAN: 11 mL/min — AB (ref >60)
GLUCOSE: 91 mg/dL (ref 65–99)
Phosphorus: 4.8 mg/dL — ABNORMAL HIGH (ref 2.5–4.6)
Potassium: 4.1 mmol/L (ref 3.5–5.1)
Sodium: 136 mmol/L (ref 135–145)

## 2015-04-16 LAB — CBC
HCT: 31.6 % — ABNORMAL LOW (ref 39.0–52.0)
Hemoglobin: 10.8 g/dL — ABNORMAL LOW (ref 13.0–17.0)
MCH: 29.4 pg (ref 26.0–34.0)
MCHC: 34.2 g/dL (ref 30.0–36.0)
MCV: 86.1 fL (ref 78.0–100.0)
PLATELETS: 156 10*3/uL (ref 150–400)
RBC: 3.67 MIL/uL — ABNORMAL LOW (ref 4.22–5.81)
RDW: 12.1 % (ref 11.5–15.5)
WBC: 5.9 10*3/uL (ref 4.0–10.5)

## 2015-04-16 LAB — HEPATITIS B CORE ANTIBODY, TOTAL: Hep B Core Total Ab: NEGATIVE

## 2015-04-16 LAB — HEPATITIS B SURFACE ANTIBODY,QUALITATIVE: Hep B S Ab: NONREACTIVE

## 2015-04-16 NOTE — Progress Notes (Signed)
[facility] PROGRESS NOTE  AZARIAN STARACE WUX:[id number] DOB: [birth year] DOA: 04/14/2015 PCP: Harriett Sine, MD    Code Status: Full code Family Communication: family not available, discussed with patient. Disposition Plan: Discharge when clinically appropriate.   Consultants:  Nephrology  Interventional radiology  Procedures:  tunneled catheter 04/15/15, by IR at [facility].  Antibiotics:  None  HPI/Subjective: No shortness of breath. He does admit to having swelling in his legs. No chest pain.  Objective: Filed Vitals:   04/16/15 1550  BP: 148/83  Pulse: 79  Temp: 98.2 F (36.8 C)  Resp: 16   oxygen saturation 98% on room air.  Intake/Output Summary (Last 24 hours) at 04/16/15 1850 Last data filed at 04/16/15 1550  Gross per 24 hour  Intake   1520 ml  Output      0 ml  Net   1520 ml   Filed Weights   04/15/15 0656 04/16/15 0626  Weight: 80.105 kg (176 lb 9.6 oz) 81.784 kg (180 lb 4.8 oz)    Exam:   General:  Alert 34 year old Caucasian man in no acute distress. Sitting up on side of bed  Cardiovascular: S1, S2, with a soft systolic murmur. Regular rate and rhythm  Respiratory: Clear bilaterally.  Abdomen: Positive bowel sounds, soft, nontender nondistended.  Musculoskeletal/extremities: 1+ bilateral lower extremity edema, pitting.  Neurologic: He is alert and oriented 3. Cranial nerves II through XII are intact.   Data Reviewed: Basic Metabolic Panel:  Recent Labs Lab 04/12/15 0941 04/14/15 1032 04/15/15 0628 04/16/15 0707  NA 134* 134* 136 136  K 4.5 4.4 3.9 4.1  CL 100* 102 104 102  CO2 21* 19* 23 25  GLUCOSE 118* 139* 93 91  BUN 77* 80* 80* 59*  CREATININE 7.58* 7.41* 6.98* 5.83*  CALCIUM 8.5* 8.6* 8.5* 8.7*  PHOS  --  6.1* 6.2* 4.8*   Liver Function Tests:  Recent Labs Lab 04/16/15 0707  ALBUMIN 3.6   No results for input(s): LIPASE, AMYLASE in the last 168 hours. No results for input(s): AMMONIA in the last  168 hours. CBC:  Recent Labs Lab 04/12/15 0941 04/14/15 1032 04/15/15 0628 04/15/15 1747 04/16/15 0707  WBC 5.8 6.6 5.4 4.2 5.9  NEUTROABS 4.4 5.2  --   --   --   HGB 10.7* 11.1* 10.2* 10.6* 10.8*  HCT 30.8* 32.2* 29.6* 30.6* 31.6*  MCV 86.0 86.1 85.3 86.7 86.1  PLT 189 186 171 185 156   Cardiac Enzymes: No results for input(s): CKTOTAL, CKMB, CKMBINDEX, TROPONINI in the last 168 hours. BNP (last 3 results)  Recent Labs  04/01/15 0847  BNP 447.0*    ProBNP (last 3 results) No results for input(s): PROBNP in the last 8760 hours.  CBG: No results for input(s): GLUCAP in the last 168 hours.  No results found for this or any previous visit (from the past 240 hour(s)).   Studies: Ir Fluoro Guide Cv Line Right  04/15/2015   INDICATION: End-stage renal disease. In need of intravenous access for the initiation of dialysis.  EXAM: TUNNELED CENTRAL VENOUS HEMODIALYSIS CATHETER PLACEMENT WITH ULTRASOUND AND FLUOROSCOPIC GUIDANCE  MEDICATIONS: Vancomycin 1 gm IV; The IV antibiotic was given in an appropriate time interval prior to skin puncture.  CONTRAST:  None  ANESTHESIA/SEDATION: Versed 2 mg IV; Fentanyl 50 mcg IV  Total Moderate Sedation Time  10 minutes.  FLUOROSCOPY TIME:  1 minute (25.3 mGy)  COMPLICATIONS: None immediate  PROCEDURE: Informed written consent was obtained from the patient after a  discussion of the risks, benefits, and alternatives to treatment. Questions regarding the procedure were encouraged and answered. The right neck and chest were prepped with chlorhexidine in a sterile fashion, and a sterile drape was applied covering the operative field. Maximum barrier sterile technique with sterile gowns and gloves were used for the procedure. A timeout was performed prior to the initiation of the procedure.  After creating a small venotomy incision, a micropuncture kit was utilized to access the right internal jugular vein under direct, real-time ultrasound guidance after  the overlying soft tissues were anesthetized with 1% lidocaine with epinephrine. Ultrasound image documentation was performed. The microwire was kinked to measure appropriate catheter length. A stiff Glidewire was advanced to the level of the IVC and the micropuncture sheath was exchanged for a peel-away sheath. A hemosplit tunneled hemodialysis catheter measuring 23 cm from tip to cuff was tunneled in a retrograde fashion from the anterior chest wall to the venotomy incision.  The catheter was then placed through the peel-away sheath with tips ultimately positioned within the superior aspect of the right atrium. Final catheter positioning was confirmed and documented with a spot radiographic image. The catheter aspirates and flushes normally. The catheter was flushed with appropriate volume heparin dwells.  The catheter exit site was secured with a 0-Prolene retention suture. The venotomy incision was closed with an interrupted 4-0 Vicryl, Dermabond and Steri-strips. Dressings were applied. The patient tolerated the procedure well without immediate post procedural complication.  IMPRESSION: Successful placement of 23 cm tip to cuff tunneled hemodialysis catheter via the right internal jugular vein with tips terminating within the superior aspect of the right atrium. The catheter is ready for immediate use.   Electronically Signed   By: Sandi Mariscal M.D.   On: 04/15/2015 14:08   Ir US Guide Vasc Access Right  04/15/2015   INDICATION: End-stage renal disease. In need of intravenous access for the initiation of dialysis.  EXAM: TUNNELED CENTRAL VENOUS HEMODIALYSIS CATHETER PLACEMENT WITH ULTRASOUND AND FLUOROSCOPIC GUIDANCE  MEDICATIONS: Vancomycin 1 gm IV; The IV antibiotic was given in an appropriate time interval prior to skin puncture.  CONTRAST:  None  ANESTHESIA/SEDATION: Versed 2 mg IV; Fentanyl 50 mcg IV  Total Moderate Sedation Time  10 minutes.  FLUOROSCOPY TIME:  1 minute (20.1 mGy)  COMPLICATIONS: None  immediate  PROCEDURE: Informed written consent was obtained from the patient after a discussion of the risks, benefits, and alternatives to treatment. Questions regarding the procedure were encouraged and answered. The right neck and chest were prepped with chlorhexidine in a sterile fashion, and a sterile drape was applied covering the operative field. Maximum barrier sterile technique with sterile gowns and gloves were used for the procedure. A timeout was performed prior to the initiation of the procedure.  After creating a small venotomy incision, a micropuncture kit was utilized to access the right internal jugular vein under direct, real-time ultrasound guidance after the overlying soft tissues were anesthetized with 1% lidocaine with epinephrine. Ultrasound image documentation was performed. The microwire was kinked to measure appropriate catheter length. A stiff Glidewire was advanced to the level of the IVC and the micropuncture sheath was exchanged for a peel-away sheath. A hemosplit tunneled hemodialysis catheter measuring 23 cm from tip to cuff was tunneled in a retrograde fashion from the anterior chest wall to the venotomy incision.  The catheter was then placed through the peel-away sheath with tips ultimately positioned within the superior aspect of the right atrium. Final catheter positioning was  confirmed and documented with a spot radiographic image. The catheter aspirates and flushes normally. The catheter was flushed with appropriate volume heparin dwells.  The catheter exit site was secured with a 0-Prolene retention suture. The venotomy incision was closed with an interrupted 4-0 Vicryl, Dermabond and Steri-strips. Dressings were applied. The patient tolerated the procedure well without immediate post procedural complication.  IMPRESSION: Successful placement of 23 cm tip to cuff tunneled hemodialysis catheter via the right internal jugular vein with tips terminating within the superior aspect  of the right atrium. The catheter is ready for immediate use.   Electronically Signed   By: Sandi Mariscal M.D.   On: 04/15/2015 14:08    Scheduled Meds: . calcium acetate  667 mg Oral TID WC  . docusate sodium  100 mg Oral BID  . epoetin (EPOGEN/PROCRIT) injection  2,000 Units Intravenous Q T,Th,Sa-HD  . feeding supplement (NEPRO CARB STEADY)  237 mL Oral Q24H  . hydrALAZINE  50 mg Oral 3 times per day  . labetalol  300 mg Oral BID  . methadone  75 mg Oral Daily  . multivitamin with minerals  1 tablet Oral Daily  . nicotine  14 mg Transdermal Daily  . NIFEdipine  90 mg Oral Daily  . sodium bicarbonate  650 mg Oral BID  . vancomycin  1,000 mg Intravenous Once   Continuous Infusions:   Assessment and plan:  Principal Problem:   Acute on chronic renal failure Active Problems:   Substance abuse   Chronic kidney disease, stage V   Metabolic acidosis   Pulmonary vascular congestion   Bilateral leg edema   Malignant hypertension   Methadone maintenance therapy patient   Diastolic dysfunction  Acute on chronic kidney disease.  Patient's creatinine during the previous hospitalization was 6.73 at the time of discharge. A couple days ago in the ED, it was 7.58. He returned with the creatinine of 7.41. 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 His potassium is within normal limits. He does not have pulmonary edema, but he does have lower extremity edema which can be attributed to both chronic diastolic dysfunction and progressive renal failure.  -Patient is being seen by nephrology and underwent tunneled hemodialysis catheter placement on 6/28. He'll be continued on hemodialysis per nephrology. Arrangements are being made for outpatient dialysis. Once dialysis arrangements have been completed, he can likely be discharged..   Metabolic acidosis, secondary to chronic kidney disease.  Patient CO2 was 19 on admission. He was restarted on  sodium bicarbonate tablets with improvement.  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 PhosLo was restarted.  Malignant hypertension. The patient's blood pressure is notoriously malignant. He is on labetalol and hydralazine and Procardia. Also receiving hemodialysis. Blood pressure is improving.  History of substance abuse on methadone maintenance therapy. Methadone was continued cautiously. His QT interval is prolonged, which is likely secondary to methadone. We will continue to monitor him on telemetry.  Tobacco abuse. The patient was advised to stop smoking. Nicotine patch was ordered.  Diastolic dysfunction. 2-D echocardiogram on 04/01/2015 revealed grade 2 diastolic dysfunction and an EF of 55%.  Patient does not have pulmonary edema, but he does have lower extremity edema which he says is chronic and can be attributable to worsening renal disease. We'll defer start of diuretic therapy to nephrology. Otherwise, hemodialysis should treat the lower extremity edema.  Time spent: 35 minutes    [place] [facility] Pager [phone]. If 7PM-7AM,  please contact night-coverage at [email or web address], password [facility] 04/16/2015, 6:50 PM  LOS: 2 days

## 2015-04-16 NOTE — Clinical Social Work Note (Signed)
CSW faxed rest of referral to [place]. Heather at [facility] indicates pt will most likely be on Tuesday, Thursday, Saturday schedule around lunch time. Pt notified, but aware that CSW will notify him when confirmed.   [name], [place]

## 2015-04-16 NOTE — Progress Notes (Signed)
Reviewed 1200 cc fluid restriction with patient.

## 2015-04-16 NOTE — Progress Notes (Signed)
Reviewed written education materials with pt on renal diet. Pt verbalized understanding through teach-back.

## 2015-04-16 NOTE — Progress Notes (Addendum)
Hemodialysis-Tolerated treatment #2 without issue. Pt has no complaints. Pt transported back to floor.

## 2015-04-16 NOTE — Progress Notes (Signed)
JAREE REMPE  MRN: [id number]  DOB/AGE: 1981-01-15 34 y.o.  Primary Care Physician:BEFEKADU,BELAYENH S, MD  Admit date: 04/14/2015  Chief Complaint:  Chief Complaint  Patient presents with  . Vascular Access Problem    S-Pt presented on  04/14/2015 with  Chief Complaint  Patient presents with  . Vascular Access Problem  .    Pt today feels better. Pt says dialysis was fine yesterday. Pt says soreness over cath site is better.  Meds . calcium acetate  667 mg Oral TID WC  . docusate sodium  100 mg Oral BID  . epoetin (EPOGEN/PROCRIT) injection  2,000 Units Intravenous Q T,Th,Sa-HD  . feeding supplement (NEPRO CARB STEADY)  237 mL Oral Q24H  . hydrALAZINE  50 mg Oral 3 times per day  . labetalol  300 mg Oral BID  . methadone  75 mg Oral Daily  . multivitamin with minerals  1 tablet Oral Daily  . nicotine  14 mg Transdermal Daily  . NIFEdipine  90 mg Oral Daily  . sodium bicarbonate  650 mg Oral BID  . vancomycin  1,000 mg Intravenous Once      Physical Exam: Vital signs in last 24 hours: Temp:  [98 F (36.7 C)-98.7 F (37.1 C)] 98.4 F (36.9 C) (06/29 0630) Pulse Rate:  [73-90] 90 (06/29 0630) Resp:  [10-20] 20 (06/29 0630) BP: (133-180)/(81-106) 164/95 mmHg (06/29 0657) SpO2:  [93 %-100 %] 98 % (06/29 0630) Weight:  [180 lb 4.8 oz (81.784 kg)] 180 lb 4.8 oz (81.784 kg) (06/29 0626) Weight change: -4 lb 11.2 oz (-2.132 kg) Last BM Date: 04/14/15  Intake/Output from previous day: 06/28 0701 - 06/29 0700 In: 1806.7 [P.O.:840; I.V.:966.7] Out: 0  Total I/O In: 240 [P.O.:240] Out: -    Physical Exam: General- pt is awake,alert, oriented to time place and person Resp- No acute REsp distress, CTA B/L NO Rhonchi CVS- S1S2 regular in rate and rhythm GIT- BS+, soft, NT, ND EXT- NO LE Edema, Cyanosis Access- Tunneled cath   Lab Results: CBC  Recent Labs  04/15/15 1747 04/16/15 0707  WBC 4.2 5.9  HGB 10.6* 10.8*  HCT 30.6* 31.6*  PLT 185 156      BMET  Recent Labs  04/15/15 0628 04/16/15 0707  NA 136 136  K 3.9 4.1  CL 104 102  CO2 23 25  GLUCOSE 93 91  BUN 80* 59*  CREATININE 6.98* 5.83*  CALCIUM 8.5* 8.7*      MICRO No results found for this or any previous visit (from the past 240 hour(s)).    Lab Results  Component Value Date   PTH 118* 04/02/2015   PTH Comment 04/02/2015   CALCIUM 8.7* 04/16/2015   PHOS 4.8* 04/16/2015           Impression: 1)Renal CKD stage 5 .  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 Phosphorus high Calcium at goal.   5)Substance abuse- Pt gave hx of using illegal drugs  6)Electrolytes Normokalemic NOrmonatremic  7)Acid base Co2 at goal    Plan:  Will dialyze  today  Shoni Quijas S 04/16/2015, 10:07 AM

## 2015-04-17 DIAGNOSIS — N186 End stage renal disease: Secondary | ICD-10-CM

## 2015-04-17 LAB — BASIC METABOLIC PANEL
Anion gap: 10 (ref 5–15)
BUN: 36 mg/dL — AB (ref 6–20)
CALCIUM: 8.6 mg/dL — AB (ref 8.9–10.3)
CHLORIDE: 101 mmol/L (ref 101–111)
CO2: 27 mmol/L (ref 22–32)
Creatinine, Ser: 4.61 mg/dL — ABNORMAL HIGH (ref 0.61–1.24)
GFR, EST AFRICAN AMERICAN: 18 mL/min — AB (ref >60)
GFR, EST NON AFRICAN AMERICAN: 15 mL/min — AB (ref >60)
GLUCOSE: 91 mg/dL (ref 65–99)
Potassium: 3.8 mmol/L (ref 3.5–5.1)
Sodium: 138 mmol/L (ref 135–145)

## 2015-04-17 MED ORDER — SODIUM BICARBONATE 650 MG PO TABS: 650.0000 mg | ORAL_TABLET | Freq: Two times a day (BID) | ORAL | Status: DC

## 2015-04-17 MED ORDER — NICOTINE 14 MG/24HR TD PT24: 14.0000 mg | MEDICATED_PATCH | Freq: Every day | TRANSDERMAL | Status: DC

## 2015-04-17 MED ORDER — LABETALOL HCL 300 MG PO TABS: 300.0000 mg | ORAL_TABLET | Freq: Two times a day (BID) | ORAL | Status: AC

## 2015-04-17 MED ORDER — HYDRALAZINE HCL 50 MG PO TABS: 50.0000 mg | ORAL_TABLET | Freq: Three times a day (TID) | ORAL | Status: AC

## 2015-04-17 MED ORDER — PROMETHAZINE HCL 25 MG PO TABS: 25.0000 mg | ORAL_TABLET | Freq: Four times a day (QID) | ORAL | Status: DC | PRN

## 2015-04-17 MED ORDER — NIFEDIPINE ER OSMOTIC RELEASE 90 MG PO TB24: 90.0000 mg | ORAL_TABLET | Freq: Every day | ORAL | Status: DC

## 2015-04-17 NOTE — Clinical Social Work Note (Signed)
CSW spoke with Terrance Reynolds and they are aware of d/c today. Pt will complete paperwork at [facility] tomorrow at 10 AM and they will notify him then of what time dialysis will be Saturday. Pt aware and agreeable.  [name], [place]

## 2015-04-17 NOTE — Discharge Summary (Signed)
Physician Discharge Summary  IZEAH VOSSLER EBR:[id number] DOB: Aug 20, 1981 DOA: 04/14/2015  PCP: Harriett Sine, MD  Admit date: 04/14/2015 Discharge date: 04/17/2015  Time spent: 40 minutes  Recommendations for Outpatient Follow-up:  1. OP dialysis to be set up starting 04/18/15 2. Follow up with PCP 04/25/15 for evaluation of BP control 3. Follow up with [facility] 4 weeks for evaluation kidney function.  Discharge Diagnoses:  Principal Problem:   Acute on chronic renal failure Active Problems:   Substance abuse   Chronic kidney disease, stage V   Metabolic acidosis   Pulmonary vascular congestion   Bilateral leg edema   Malignant hypertension   Methadone maintenance therapy patient   Diastolic dysfunction   Discharge Condition: stable  Diet recommendation: 1200 ml fluid restriction heart healthy  Filed Weights   04/16/15 0626 04/17/15 0422  Weight: 81.784 kg (180 lb 4.8 oz) 80.967 kg (178 lb 8 oz)    History of present illness:  Terrance Reynolds is a 34 y.o. male with a history of near end-stage renal disease, malignant hypertension, hepatitis C, substance abuse-on methadone maintenance therapy, and psoriasis, who presented to the emergency department on 04/14/15 after he was instructed by his nephrologist to come to the emergency department for treatment of worsening renal function. The patient was recently hospitalized and discharged  Week prior for treatment of hypertensive crisis. During that time, he was found to have progressive acute on chronic renal failure. At the time of discharge, his creatinine was below 7. Nephrologist, Dr. Lowanda Foster followed the patient during the hospitalization. He recommended that the patient follow-up with him for a repeat of his renal function tests. Several days prior, the patient was called and was told that his renal function had gotten even worse and that he needed to come to the hospital for initiation of hemodialysis.  Patient had no  complaints of shortness of breath at rest, but he did become more short of breath with activity. He denied orthopnea. He denied chest pain or pleurisy. He did acknowledge swelling in his legs which worsened when he stands. He had a mild global headache, but no dizziness or visual changes. He had some nausea and vomiting on one occasion. He denied abdominal pain. He denied diarrhea or Jacquelinne Speak tarry stools.  In the ED, he was afebrile and hemodynamically stable. His blood pressure was 146/85. His chest x-ray revealed stable chronic bronchitic changes and mild interstitial prominence with no superimposed infiltrate or pulmonary edema. His lab data were significant for a CO2 of 19, creatinine of 7.41, normal potassium of 4.4, glucose of 139, phosphorus of 6.1, hemoglobin 11.1.    Hospital Course:  Acute on chronic kidney disease. Patient's creatinine during the previous hospitalization was 6.73 at the time of discharge. He returned with the creatinine of 7.41. 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 His potassium was within normal limits. He did not have pulmonary edema, but he did have lower extremity edema which can be attributed to both chronic diastolic dysfunction and progressive renal failure. -Patient was being seen by nephrology and underwent tunneled hemodialysis catheter placement on 6/28. Provided with hemodialysis per nephrology. Arrangements made for outpatient dialysis. Follow up with [facility] 4 weeks   Metabolic acidosis, secondary to chronic kidney disease.  Patient CO2 was 19 on admission. He was restarted on sodium bicarbonate tablets with improvement. At discharge CO2 27. Will discharge with sodium bicarb tabs  Hyperphosphatemia secondary to chronic kidney disease. PhosLo  was restarted.  Malignant hypertension. The patient's blood pressure  notoriously malignant. Continued  labetalol and hydralazine and Procardia. Also  receiving hemodialysis. Blood pressure improved. OP follow up 04/25/15 for monitoring of BP control  History of substance abuse on methadone maintenance therapy. Methadone was continued cautiously. His QT interval was prolonged, which is likely secondary to methadone. No events on tele.   Tobacco abuse. The patient was advised to stop smoking. Nicotine patch was ordered.  Diastolic dysfunction. 2-D echocardiogram on 04/01/2015 revealed grade 2 diastolic dysfunction and an EF of 55%. Patient did not have pulmonary edema, but he did have lower extremity edema which he said is chronic and can be attributable to worsening renal disease. Hemodialysis improved lower extremity edema.   Procedures:  Tunneled catheter 04/15/15 by [facility] at [facility]  Consultations:  Nephrology  Interventional radiology  Discharge Exam: Filed Vitals:   04/17/15 0422  BP: 169/102  Pulse: 96  Temp: 98.7 F (37.1 C)  Resp: 16    General: well nourished  Cardiovascular: RRR +murmur trace LE edema Respiratory: normal effort BS clear  Discharge Instructions    Current Discharge Medication List    START taking these medications   Details  nicotine (NICODERM CQ - DOSED IN MG/24 HOURS) 14 mg/24hr patch Place 1 patch (14 mg total) onto the skin daily.    sodium bicarbonate 650 MG tablet Take 1 tablet (650 mg total) by mouth 2 (two) times daily. Qty: 60 tablet, Refills: 1      CONTINUE these medications which have CHANGED   Details  hydrALAZINE (APRESOLINE) 50 MG tablet Take 1 tablet (50 mg total) by mouth every 8 (eight) hours.    labetalol (NORMODYNE) 300 MG tablet Take 1 tablet (300 mg total) by mouth 2 (two) times daily.    NIFEdipine (PROCARDIA XL/ADALAT-CC) 90 MG 24 hr tablet Take 1 tablet (90 mg total) by mouth daily.    promethazine (PHENERGAN) 25 MG tablet Take 1 tablet (25 mg total) by mouth every 6 (six) hours as needed for nausea or vomiting.      CONTINUE these medications which  have NOT CHANGED   Details  aspirin 325 MG tablet Take 325-650 mg by mouth daily as needed for headache.    methadone (DOLOPHINE) 10 MG tablet Take 75 mg by mouth daily.    Multiple Vitamin (MULTIVITAMIN WITH MINERALS) TABS tablet Take 1 tablet by mouth daily.    calcium acetate (PHOSLO) 667 MG capsule Take 1 capsule (667 mg total) by mouth 3 (three) times daily with meals. Qty: 90 capsule, Refills: 1       Allergies  Allergen Reactions  . Penicillins Other (See Comments)    Child hood allergy   Follow-up Information    Follow up with [facility], [place].   Specialty:  Nephrology   Contact information:   [address] [address] [phone]       Follow up with [facility] On 04/25/2015.   Why:  appointment at 1:30 to monitor BP control   Contact information:   [address] [place] [address] [phone]        The results of significant diagnostics from this hospitalization (including imaging, microbiology, ancillary and laboratory) are listed below for reference.    Significant Diagnostic Studies: Dg Chest 2 View  04/14/2015   CLINICAL DATA:  High blood pressure, smoker, shortness of Breath  EXAM: CHEST  2 VIEW  COMPARISON:  04/12/2015  FINDINGS: Cardiomediastinal  silhouette is stable. Mild thoracic dextroscoliosis again noted. Stable chronic bronchitic changes and mild interstitial prominence. No superimposed infiltrate or pulmonary edema.  IMPRESSION: Stable chronic bronchitic changes and mild interstitial prominence. No superimposed infiltrate or pulmonary edema. Mild thoracic dextroscoliosis.   Electronically Signed   By: Lahoma Crocker M.D.   On: 04/14/2015 10:42   Dg Chest 2 View  04/12/2015   CLINICAL DATA:  Shortness of breath, peripheral edema, hypertension, renal failure  EXAM: CHEST  2 VIEW  COMPARISON:  04/01/2015  FINDINGS: Normal heart size and vascularity. No focal pneumonia, collapse or consolidation. No  edema, effusion, or pneumothorax. Trachea midline. Stable mild upper thoracic scoliosis. Mild central bronchitic change and interstitial prominence, nonspecific.  IMPRESSION: Stable bronchitic change and interstitial prominence. No superimposed acute process.   Electronically Signed   By: Jerilynn Mages.  Shick M.D.   On: 04/12/2015 10:08   X-ray Chest Pa And Lateral  04/01/2015   CLINICAL DATA:  One day history of shortness of breath  EXAM: CHEST  2 VIEW  COMPARISON:  None.  FINDINGS: There is no edema or consolidation. The heart size and pulmonary vascularity are normal. No adenopathy. No bone lesions.  IMPRESSION: No edema or consolidation.   Electronically Signed   By: Lowella Grip III M.D.   On: 04/01/2015 15:25   Ct Head Wo Contrast  04/04/2015   CLINICAL DATA:  Patient woke up with severe headache in elevated blood pressure.  EXAM: CT HEAD WITHOUT CONTRAST  TECHNIQUE: Contiguous axial images were obtained from the base of the skull through the vertex without intravenous contrast.  COMPARISON:  04/01/2015  FINDINGS: Intracranial contents are symmetrical. No ventricular dilatation. No mass effect or midline shift. No abnormal extra-axial fluid collections. Gray-white matter junctions are distinct. Basal cisterns are not effaced. No evidence of acute intracranial hemorrhage. No depressed skull fractures. Mucosal thickening in the paranasal sinuses. Mastoid air cells are not opacified.  IMPRESSION: No acute intracranial abnormalities.   Electronically Signed   By: Lucienne Capers M.D.   On: 04/04/2015 02:04   Ct Head Wo Contrast  04/01/2015   CLINICAL DATA:  Headache for 2 days with nausea and vomiting and blurred vision.  EXAM: CT HEAD WITHOUT CONTRAST  TECHNIQUE: Contiguous axial images were obtained from the base of the skull through the vertex without intravenous contrast.  COMPARISON:  12/25/2014  FINDINGS: No mass lesion. No midline shift. No acute hemorrhage or hematoma. No extra-axial fluid collections.  No evidence of acute infarction. Brain parenchyma is normal. Osseous structures are normal.  IMPRESSION: Normal exam.   Electronically Signed   By: Lorriane Shire M.D.   On: 04/01/2015 13:57   US Abdomen Complete  04/03/2015   CLINICAL DATA:  Hepatitis C  EXAM: ULTRASOUND ABDOMEN COMPLETE  COMPARISON:  None.  FINDINGS: Gallbladder: No gallstones, gallbladder wall thickening, or pericholecystic fluid. Negative sonographic Murphy's sign.  Common bile duct: Diameter: 5 mm  Liver: No focal lesion identified. Within normal limits in parenchymal echogenicity.  IVC: No abnormality visualized.  Pancreas: Visualized portion unremarkable. Two peripancreatic nodes measuring up to 13 mm short axis.  Spleen: Size and appearance within normal limits.  Right Kidney: Length: 11.0 cm. Echogenic renal parenchyma. No mass or hydronephrosis.  Left Kidney: Length: 11.1 cm. Echogenic renal parenchyma. No mass or hydronephrosis.  Abdominal aorta: No aneurysm visualized.  Other findings: None.  IMPRESSION: Echogenic renal parenchyma, suggesting medical renal disease. No hydronephrosis.  Otherwise negative abdominal ultrasound.   Electronically Signed   By: Bertis Ruddy  [place] Pink M.D.   On: 04/03/2015 11:52   US Renal  04/01/2015   CLINICAL DATA:  Increased creatinine  EXAM: RENAL / URINARY TRACT ULTRASOUND COMPLETE  COMPARISON:  None.  FINDINGS: Right Kidney:  Length: 11 cm.  Echogenic cortex.  No hydronephrosis or mass lesion  Left Kidney:  Length: 11 cm.  Echogenic cortex.  No hydronephrosis or mass lesion  Bladder:  Appears normal for degree of bladder distention. Patent bilateral ureters with jets.  IMPRESSION: 1. Medical renal disease without atrophy. 2. No hydronephrosis.   Electronically Signed   By: Monte Fantasia M.D.   On: 04/01/2015 16:13   Ir Fluoro Guide Cv Line Right  04/15/2015   INDICATION: End-stage renal disease. In need of intravenous access for the initiation of dialysis.  EXAM: TUNNELED CENTRAL VENOUS HEMODIALYSIS  CATHETER PLACEMENT WITH ULTRASOUND AND FLUOROSCOPIC GUIDANCE  MEDICATIONS: Vancomycin 1 gm IV; The IV antibiotic was given in an appropriate time interval prior to skin puncture.  CONTRAST:  None  ANESTHESIA/SEDATION: Versed 2 mg IV; Fentanyl 50 mcg IV  Total Moderate Sedation Time  10 minutes.  FLUOROSCOPY TIME:  1 minute (16.1 mGy)  COMPLICATIONS: None immediate  PROCEDURE: Informed written consent was obtained from the patient after a discussion of the risks, benefits, and alternatives to treatment. Questions regarding the procedure were encouraged and answered. The right neck and chest were prepped with chlorhexidine in a sterile fashion, and a sterile drape was applied covering the operative field. Maximum barrier sterile technique with sterile gowns and gloves were used for the procedure. A timeout was performed prior to the initiation of the procedure.  After creating a small venotomy incision, a micropuncture kit was utilized to access the right internal jugular vein under direct, real-time ultrasound guidance after the overlying soft tissues were anesthetized with 1% lidocaine with epinephrine. Ultrasound image documentation was performed. The microwire was kinked to measure appropriate catheter length. A stiff Glidewire was advanced to the level of the IVC and the micropuncture sheath was exchanged for a peel-away sheath. A hemosplit tunneled hemodialysis catheter measuring 23 cm from tip to cuff was tunneled in a retrograde fashion from the anterior chest wall to the venotomy incision.  The catheter was then placed through the peel-away sheath with tips ultimately positioned within the superior aspect of the right atrium. Final catheter positioning was confirmed and documented with a spot radiographic image. The catheter aspirates and flushes normally. The catheter was flushed with appropriate volume heparin dwells.  The catheter exit site was secured with a 0-Prolene retention suture. The venotomy  incision was closed with an interrupted 4-0 Vicryl, Dermabond and Steri-strips. Dressings were applied. The patient tolerated the procedure well without immediate post procedural complication.  IMPRESSION: Successful placement of 23 cm tip to cuff tunneled hemodialysis catheter via the right internal jugular vein with tips terminating within the superior aspect of the right atrium. The catheter is ready for immediate use.   Electronically Signed   By: Sandi Mariscal M.D.   On: 04/15/2015 14:08   Ir US Guide Vasc Access Right  04/15/2015   INDICATION: End-stage renal disease. In need of intravenous access for the initiation of dialysis.  EXAM: TUNNELED CENTRAL VENOUS HEMODIALYSIS CATHETER PLACEMENT WITH ULTRASOUND AND FLUOROSCOPIC GUIDANCE  MEDICATIONS: Vancomycin 1 gm IV; The IV antibiotic was given in an appropriate time interval prior to skin puncture.  CONTRAST:  None  ANESTHESIA/SEDATION: Versed 2 mg IV; Fentanyl 50 mcg IV  Total Moderate Sedation Time  10 minutes.  FLUOROSCOPY TIME:  1 minute (53.9 mGy)  COMPLICATIONS: None immediate  PROCEDURE: Informed written consent was obtained from the patient after a discussion of the risks, benefits, and alternatives to treatment. Questions regarding the procedure were encouraged and answered. The right neck and chest were prepped with chlorhexidine in a sterile fashion, and a sterile drape was applied covering the operative field. Maximum barrier sterile technique with sterile gowns and gloves were used for the procedure. A timeout was performed prior to the initiation of the procedure.  After creating a small venotomy incision, a micropuncture kit was utilized to access the right internal jugular vein under direct, real-time ultrasound guidance after the overlying soft tissues were anesthetized with 1% lidocaine with epinephrine. Ultrasound image documentation was performed. The microwire was kinked to measure appropriate catheter length. A stiff Glidewire was  advanced to the level of the IVC and the micropuncture sheath was exchanged for a peel-away sheath. A hemosplit tunneled hemodialysis catheter measuring 23 cm from tip to cuff was tunneled in a retrograde fashion from the anterior chest wall to the venotomy incision.  The catheter was then placed through the peel-away sheath with tips ultimately positioned within the superior aspect of the right atrium. Final catheter positioning was confirmed and documented with a spot radiographic image. The catheter aspirates and flushes normally. The catheter was flushed with appropriate volume heparin dwells.  The catheter exit site was secured with a 0-Prolene retention suture. The venotomy incision was closed with an interrupted 4-0 Vicryl, Dermabond and Steri-strips. Dressings were applied. The patient tolerated the procedure well without immediate post procedural complication.  IMPRESSION: Successful placement of 23 cm tip to cuff tunneled hemodialysis catheter via the right internal jugular vein with tips terminating within the superior aspect of the right atrium. The catheter is ready for immediate use.   Electronically Signed   By: Sandi Mariscal M.D.   On: 04/15/2015 14:08    Microbiology: No results found for this or any previous visit (from the past 240 hour(s)).   Labs: Basic Metabolic Panel:  Recent Labs Lab 04/12/15 0941 04/14/15 1032 04/15/15 0628 04/16/15 0707 04/17/15 0603  NA 134* 134* 136 136 138  K 4.5 4.4 3.9 4.1 3.8  CL 100* 102 104 102 101  CO2 21* 19* _0 GLUCOSE 118* 139* 93 91 91  BUN 77* 80* 80* 59* 36*  CREATININE 7.58* 7.41* 6.98* 5.83* 4.61*  CALCIUM 8.5* 8.6* 8.5* 8.7* 8.6*  PHOS  --  6.1* 6.2* 4.8*  --    Liver Function Tests:  Recent Labs Lab 04/16/15 0707  ALBUMIN 3.6   No results for input(s): LIPASE, AMYLASE in the last 168 hours. No results for input(s): AMMONIA in the last 168 hours. CBC:  Recent Labs Lab 04/12/15 0941 04/14/15 1032 04/15/15 0628  04/15/15 1747 04/16/15 0707  WBC 5.8 6.6 5.4 4.2 5.9  NEUTROABS 4.4 5.2  --   --   --   HGB 10.7* 11.1* 10.2* 10.6* 10.8*  HCT 30.8* 32.2* 29.6* 30.6* 31.6*  MCV 86.0 86.1 85.3 86.7 86.1  PLT 189 186 171 185 156   Cardiac Enzymes: No results for input(s): CKTOTAL, CKMB, CKMBINDEX, TROPONINI in the last 168 hours. BNP: BNP (last 3 results)  Recent Labs  04/01/15 0847  BNP 447.0*    ProBNP (last 3 results) No results for input(s): PROBNP in the last 8760 hours.  CBG: No results for input(s): GLUCAP in the last 168 hours.     Signed:  [facility] M  [facility] 04/17/2015, 10:35 AM

## 2015-04-17 NOTE — Progress Notes (Signed)
Patient with orders to be discharge home. Discharge instructions given, patient verbalized understanding. Patient is set up with [facility]. Prescriptions given. Patient's home medication returned. Patient stable. Patient left in private vehicle with mother.

## 2015-04-17 NOTE — Care Management Note (Signed)
Case Management Note  Patient Details  Name: Terrance Reynolds MRN: [id number] Date of Birth: 03-Feb-1981  Subjective/Objective:                    Action/Plan:   Expected Discharge Date:                  Expected Discharge Plan:  Home/Self Care  In-House Referral:  Clinical Social Work  Discharge planning Services  CM Consult  Post Acute Care Choice:  NA Choice offered to:  NA  DME Arranged:    DME Agency:     HH Arranged:    [place] Agency:     Status of Service:  Completed, signed off  Medicare Important Message Given:    Date Medicare IM Given:    Medicare IM give by:    Date Additional Medicare IM Given:    Additional Medicare Important Message give by:     If discussed at [place] of Stay Meetings, dates discussed:    Additional Comments: Pt discharged home today. CSW has arranged outpt dialysis and relayed information to pt. No Cm needs noted. Christinia Gully [place], RN 04/17/2015, 12:47 PM

## 2015-04-17 NOTE — Progress Notes (Signed)
Subjective: Patient offers no complaints. Presently he feels much better. He denies any nausea or vomiting. His appetite is improving.   Objective: Vital signs in last 24 hours: Temp:  [98.2 F (36.8 C)-98.7 F (37.1 C)] 98.7 F (37.1 C) (06/30 0422) Pulse Rate:  [78-96] 96 (06/30 0422) Resp:  [16] 16 (06/30 0422) BP: (146-169)/(80-102) 169/102 mmHg (06/30 0422) SpO2:  [96 %-100 %] 96 % (06/30 0422) Weight:  [178 lb 8 oz (80.967 kg)] 178 lb 8 oz (80.967 kg) (06/30 0422)  Intake/Output from previous day: 06/29 0701 - 06/30 0700 In: 240 [P.O.:240] Out: 0  Intake/Output this shift:     Recent Labs  04/14/15 1032 04/15/15 0628 04/15/15 1747 04/16/15 0707  HGB 11.1* 10.2* 10.6* 10.8*    Recent Labs  04/15/15 1747 04/16/15 0707  WBC 4.2 5.9  RBC 3.53* 3.67*  HCT 30.6* 31.6*  PLT 185 156    Recent Labs  04/16/15 0707 04/17/15 0603  NA 136 138  K 4.1 3.8  CL 102 101  CO2 25 27  BUN 59* 36*  CREATININE 5.83* 4.61*  GLUCOSE 91 91  CALCIUM 8.7* 8.6*    Recent Labs  04/15/15 0827  INR 1.08    Generally patient is alert and in no apparent distress. HEENT exam no conjunctival pallor and icterus. Oral mucosa seems to be moist Chest is clear to auscultation Heart exam revealed regular rate and rhythm no murmur Extremities no edema Assessment/Plan: Problem #1 renal failure: Patient is status post hemodialysis yesterday. Presently patient seems to be feeling much better. His pending creatinine has come down and potassium is normal. Problem #2 hypertension: His blood pressure is reasonably controlled Problem #3 anemia: His hemoglobin is within our target goal and patient is on Epogen Problem #4 fluid management: Patient presently doesn't have any sinus symptoms of fluid overload. Problem #5 metabolic bone disease: His calcium is range but his phosphorus is high. Patient presently on a binder. Problem #6 history of hepatitis C infection: Being followed by  GI Problem #7 history of polysubstance abuse. Plan: We'll make arrangements for patient to get dialysis tomorrow 2] once arrangements for dialysis is made as an outpatient patient could be discharged to be followed as an outpatient. 3] since patient has missed his appointment for PCP may need another arrangement before his discharge as patient does not have PCP 4] BMP and CBC in am   [facility] S 04/17/2015, 7:58 AM

## 2015-04-24 ENCOUNTER — Encounter (INDEPENDENT_AMBULATORY_CARE_PROVIDER_SITE_OTHER): Payer: Self-pay | Admitting: *Deleted

## 2015-05-29 ENCOUNTER — Other Ambulatory Visit: Payer: Self-pay

## 2015-05-29 DIAGNOSIS — N186 End stage renal disease: Secondary | ICD-10-CM

## 2015-05-29 DIAGNOSIS — Z01810 Encounter for preprocedural cardiovascular examination: Secondary | ICD-10-CM

## 2015-06-06 ENCOUNTER — Ambulatory Visit (INDEPENDENT_AMBULATORY_CARE_PROVIDER_SITE_OTHER): Payer: Self-pay | Admitting: Internal Medicine

## 2015-06-12 ENCOUNTER — Encounter: Payer: Self-pay | Admitting: Vascular Surgery

## 2015-06-13 ENCOUNTER — Ambulatory Visit (HOSPITAL_COMMUNITY)
Admission: RE | Admit: 2015-06-13 | Discharge: 2015-06-13 | Disposition: A | Discharge status: Home / Self Care | Payer: Medicaid Other | Source: Ambulatory Visit | Attending: Vascular Surgery | Admitting: Vascular Surgery

## 2015-06-13 ENCOUNTER — Other Ambulatory Visit: Payer: Self-pay

## 2015-06-13 ENCOUNTER — Ambulatory Visit (INDEPENDENT_AMBULATORY_CARE_PROVIDER_SITE_OTHER)
Admission: RE | Admit: 2015-06-13 | Discharge: 2015-06-13 | Disposition: A | Discharge status: Home / Self Care | Payer: Medicaid Other | Source: Ambulatory Visit | Attending: Vascular Surgery | Admitting: Vascular Surgery

## 2015-06-13 ENCOUNTER — Encounter: Payer: Self-pay | Admitting: Vascular Surgery

## 2015-06-13 ENCOUNTER — Ambulatory Visit (INDEPENDENT_AMBULATORY_CARE_PROVIDER_SITE_OTHER): Payer: Medicaid Other | Admitting: Vascular Surgery

## 2015-06-13 VITALS — BP 133/82 | HR 77 | Temp 98.2°F | Resp 14 | Ht 74.0 in | Wt 178.0 lb

## 2015-06-13 DIAGNOSIS — N186 End stage renal disease: Secondary | ICD-10-CM

## 2015-06-13 DIAGNOSIS — M79603 Pain in arm, unspecified: Secondary | ICD-10-CM | POA: Insufficient documentation

## 2015-06-13 DIAGNOSIS — Z01810 Encounter for preprocedural cardiovascular examination: Secondary | ICD-10-CM | POA: Diagnosis present

## 2015-06-13 NOTE — Progress Notes (Signed)
[facility] [place]  Patient name: Terrance Reynolds MRN: [id number] DOB: 1981-03-10 Sex: male  REASON FOR CONSULT: Evaluate for new hemodialysis access.  HPI: Terrance Reynolds is a 34 y.o. 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 He was referred by Dr. Lowanda Foster for evaluation for hemodialysis access. He has a tunneled dialysis catheter. He is right-handed. He dialyzes on Tuesdays Thursdays and Saturdays. He has had some uremic symptoms including mild nausea, vomiting, fatigue, and anorexia.  I have reviewed the records from the referring doctor's office. Creatinine on 04/15/2015 was 6.98.  Past Medical History  Diagnosis Date  . Psoriasis   . Hypertension   . Substance abuse     methadone clinic  . Anemia   . Thrombocytopenia   . Acute on chronic renal failure   . CKD (chronic kidney disease)   . Methadone maintenance therapy patient   . Diastolic dysfunction 0000000.    Grade 2 per echo   Family History  Problem Relation Age of Onset  . Diabetes Mother   . Heart disease Father   . Hypertension Father    SOCIAL HISTORY: Social History  Substance Use Topics  . Smoking status: Current Every Day Smoker -- 0.50 packs/day  . Smokeless tobacco: Not on file  . Alcohol Use: No   Allergies  Allergen Reactions  . Penicillins Other (See Comments)    Child hood allergy   Current Outpatient Prescriptions  Medication Sig Dispense Refill  . aspirin 325 MG tablet Take 325-650 mg by mouth daily as needed for headache.    . furosemide (LASIX) 80 MG tablet Take 80 mg by mouth daily.    . hydrALAZINE (APRESOLINE) 50 MG tablet Take 1 tablet (50 mg total) by mouth every 8 (eight) hours.    [name] labetalol (NORMODYNE) 300 MG tablet Take 1 tablet (300 mg total) by mouth 2 (two) times daily.    . methadone (DOLOPHINE) 10 MG tablet Take 75 mg by mouth daily.    . Multiple Vitamin (MULTIVITAMIN WITH MINERALS) TABS tablet Take 1 tablet by  mouth daily.    [name] NIFEdipine (PROCARDIA XL/ADALAT-CC) 90 MG 24 hr tablet Take 1 tablet (90 mg total) by mouth daily.    . promethazine (PHENERGAN) 25 MG tablet Take 1 tablet (25 mg total) by mouth every 6 (six) hours as needed for nausea or vomiting.    . sevelamer carbonate (RENVELA) 800 MG tablet Take 800 mg by mouth 3 (three) times daily with meals.    . calcium acetate (PHOSLO) 667 MG capsule Take 1 capsule (667 mg total) by mouth 3 (three) times daily with meals. (Patient not taking: Reported on 06/13/2015) 90 capsule 1  . nicotine (NICODERM CQ - DOSED IN MG/24 HOURS) 14 mg/24hr patch Place 1 patch (14 mg total) onto the skin daily. (Patient not taking: Reported on 06/13/2015)    . sodium bicarbonate 650 MG tablet Take 1 tablet (650 mg total) by mouth 2 (two) times daily. (Patient not taking: Reported on 06/13/2015) 60 tablet 1   No current facility-administered medications for this visit.   REVIEW OF SYSTEMS: Valu.Nieves ] denotes positive finding; [  ] denotes negative finding  CARDIOVASCULAR:  [ ]  chest pain   [ ]  chest pressure   [ ]  palpitations   [ ]  orthopnea   [ ]  dyspnea on exertion   [ ]  claudication   [ ]  rest pain   [ ]  DVT   [ ]  phlebitis  PULMONARY:   [ ]  productive cough   [ ]  asthma   [ ]  wheezing NEUROLOGIC:   [ ]  weakness  [ ]  paresthesias  [ ]  aphasia  [ ]  amaurosis  [ ]  dizziness HEMATOLOGIC:   [ ]  bleeding problems   [ ]  clotting disorders MUSCULOSKELETAL:  [ ]  joint pain   [ ]  joint swelling [ ]  leg swelling GASTROINTESTINAL: [ ]   blood in stool  [ ]   hematemesis GENITOURINARY:  [ ]   dysuria  [ ]   hematuria PSYCHIATRIC:  [ ]  history of major depression INTEGUMENTARY:  [ ]  rashes  [ ]  ulcers CONSTITUTIONAL:  [ ]  fever   [ ]  chills  PHYSICAL EXAM: Filed Vitals:   06/13/15 1156  BP: 133/82  Pulse: 77  Temp: 98.2 F (36.8 C)  Resp: 14  Height: 6\' 2"  (1.88 m)  Weight: 178 lb (80.74 kg)  SpO2: 96%   GENERAL: The patient is a well-nourished male, in no acute distress. The  vital signs are documented above. CARDIAC: There is a regular rate and rhythm.  VASCULAR: I did not detect carotid bruits. He has palpable radial pulses. He has no significant lower extremity swelling. PULMONARY: There is good air exchange bilaterally without wheezing or rales. ABDOMEN: Soft and non-tender with normal pitched bowel sounds.  MUSCULOSKELETAL: There are no major deformities or cyanosis. NEUROLOGIC: No focal weakness or paresthesias are detected. SKIN: There are no ulcers or rashes noted. On exam he appears to have a reasonable forearm cephalic vein on the left. PSYCHIATRIC: The patient has a normal affect.  DATA:  ARTERIAL DUPLEX: He has triphasic Doppler signals in the radial and ulnar positions bilaterally. Brachial artery bifurcation is in the antecubital fossa bilaterally.  UPPER EXTREME VEIN MAPPING: On the right side, the forearm and upper arm cephalic vein looked marginal in size. The right basilic vein looks reasonable in size. On the left side the forearm and upper arm cephalic vein looked marginal in size. The basilic vein looks reasonable in size.  MEDICAL ISSUES:  STAGE V CHRONIC KIDNEY DISEASE: the patient appears to be a reasonable candidate for an AV fistula in the left arm. On exam the forearm cephalic vein looks reasonable. I've explained the second choice would be an upper arm brachiocephalic fistula. The third choice would be a basilic vein transposition. I have explained the indications for placement of an AV fistula or AV graft. I've explained that if at all possible we will place an AV fistula.  I have reviewed the risks of placement of an AV fistula including but not limited to: failure of the fistula to mature, need for subsequent interventions, and thrombosis. In addition I have reviewed the potential complications of placement of an AV graft. These risks include, but are not limited to, graft thrombosis, graft infection, wound healing problems, bleeding, arm  swelling, and steal syndrome. All the patient's questions were answered and they are agreeable to proceed with surgery. Since he dialyzes on Tuesdays Thursdays and Saturdays the first Friday that I have is September 23. This reason we'll try to get him done sooner by one of my partners on a Monday Wednesday or Friday.   Deitra Mayo [facility] [place]: [phone]

## 2015-06-17 ENCOUNTER — Encounter (HOSPITAL_COMMUNITY): Payer: Self-pay | Admitting: *Deleted

## 2015-06-17 MED ORDER — SODIUM CHLORIDE 0.9 % IV SOLN: 100.0000 mL | INTRAVENOUS | Status: DC | PRN

## 2015-06-17 MED ORDER — SODIUM CHLORIDE 0.9 % IV SOLN
INTRAVENOUS | Status: DC
  Administered 2015-06-18: 08:00:00 via INTRAVENOUS

## 2015-06-17 MED ORDER — ALTEPLASE 2 MG IJ SOLR: 2.0000 mg | Freq: Once | INTRAMUSCULAR | Status: DC | PRN

## 2015-06-17 MED ORDER — VANCOMYCIN HCL IN DEXTROSE 1-5 GM/200ML-% IV SOLN
1000.0000 mg | INTRAVENOUS | Status: AC
  Administered 2015-06-18: 1000 mg via INTRAVENOUS
  Filled 2015-06-17: qty 200

## 2015-06-17 MED ORDER — CHLORHEXIDINE GLUCONATE CLOTH 2 % EX PADS: 6.0000 | MEDICATED_PAD | Freq: Once | CUTANEOUS | Status: DC

## 2015-06-17 MED ORDER — NEPRO/CARBSTEADY PO LIQD: 237.0000 mL | ORAL | Status: DC | PRN

## 2015-06-17 MED ORDER — LIDOCAINE HCL (PF) 1 % IJ SOLN: 5.0000 mL | INTRAMUSCULAR | Status: DC | PRN

## 2015-06-17 MED ORDER — HEPARIN SODIUM (PORCINE) 1000 UNIT/ML DIALYSIS: 1000.0000 [IU] | INTRAMUSCULAR | Status: DC | PRN

## 2015-06-17 MED ORDER — PENTAFLUOROPROP-TETRAFLUOROETH EX AERO: 1.0000 "application " | INHALATION_SPRAY | CUTANEOUS | Status: DC | PRN

## 2015-06-17 MED ORDER — LIDOCAINE-PRILOCAINE 2.5-2.5 % EX CREA: 1.0000 "application " | TOPICAL_CREAM | CUTANEOUS | Status: DC | PRN

## 2015-06-18 ENCOUNTER — Ambulatory Visit (HOSPITAL_COMMUNITY): Payer: Medicaid Other | Admitting: Anesthesiology

## 2015-06-18 ENCOUNTER — Encounter (HOSPITAL_COMMUNITY): Payer: Self-pay | Admitting: *Deleted

## 2015-06-18 ENCOUNTER — Encounter (INDEPENDENT_AMBULATORY_CARE_PROVIDER_SITE_OTHER): Payer: Self-pay | Admitting: *Deleted

## 2015-06-18 ENCOUNTER — Ambulatory Visit (HOSPITAL_COMMUNITY)
Admission: RE | Admit: 2015-06-18 | Discharge: 2015-06-18 | Disposition: A | Discharge status: Home / Self Care | Payer: Medicaid Other | Source: Ambulatory Visit | Attending: Vascular Surgery | Admitting: Vascular Surgery

## 2015-06-18 ENCOUNTER — Encounter (HOSPITAL_COMMUNITY)
Admission: RE | Disposition: A | Discharge status: Home / Self Care | Payer: Self-pay | Source: Ambulatory Visit | Attending: Vascular Surgery

## 2015-06-18 DIAGNOSIS — Z7982 Long term (current) use of aspirin: Secondary | ICD-10-CM | POA: Insufficient documentation

## 2015-06-18 DIAGNOSIS — L409 Psoriasis, unspecified: Secondary | ICD-10-CM | POA: Insufficient documentation

## 2015-06-18 DIAGNOSIS — E1122 Type 2 diabetes mellitus with diabetic chronic kidney disease: Secondary | ICD-10-CM | POA: Diagnosis not present

## 2015-06-18 DIAGNOSIS — Z992 Dependence on renal dialysis: Secondary | ICD-10-CM | POA: Insufficient documentation

## 2015-06-18 DIAGNOSIS — I120 Hypertensive chronic kidney disease with stage 5 chronic kidney disease or end stage renal disease: Secondary | ICD-10-CM | POA: Insufficient documentation

## 2015-06-18 DIAGNOSIS — N186 End stage renal disease: Secondary | ICD-10-CM | POA: Insufficient documentation

## 2015-06-18 DIAGNOSIS — F1910 Other psychoactive substance abuse, uncomplicated: Secondary | ICD-10-CM | POA: Diagnosis not present

## 2015-06-18 DIAGNOSIS — N185 Chronic kidney disease, stage 5: Secondary | ICD-10-CM | POA: Diagnosis not present

## 2015-06-18 DIAGNOSIS — F17210 Nicotine dependence, cigarettes, uncomplicated: Secondary | ICD-10-CM | POA: Insufficient documentation

## 2015-06-18 DIAGNOSIS — B1920 Unspecified viral hepatitis C without hepatic coma: Secondary | ICD-10-CM | POA: Insufficient documentation

## 2015-06-18 HISTORY — DX: Reserved for inherently not codable concepts without codable children: IMO0001

## 2015-06-18 HISTORY — DX: Headache: R51

## 2015-06-18 HISTORY — DX: Constipation, unspecified: K59.00

## 2015-06-18 HISTORY — PX: AV FISTULA PLACEMENT: SHX1204

## 2015-06-18 HISTORY — DX: Gastro-esophageal reflux disease without esophagitis: K21.9

## 2015-06-18 HISTORY — DX: Headache, unspecified: R51.9

## 2015-06-18 LAB — POCT I-STAT 4, (NA,K, GLUC, HGB,HCT)
Glucose, Bld: 86 mg/dL (ref 65–99)
HCT: 38 % — ABNORMAL LOW (ref 39.0–52.0)
Hemoglobin: 12.9 g/dL — ABNORMAL LOW (ref 13.0–17.0)
Potassium: 4.3 mmol/L (ref 3.5–5.1)
Sodium: 136 mmol/L (ref 135–145)

## 2015-06-18 SURGERY — ARTERIOVENOUS (AV) FISTULA CREATION
Anesthesia: General | Site: Arm Lower | Laterality: Left

## 2015-06-18 MED ORDER — PHENYLEPHRINE HCL 10 MG/ML IJ SOLN
INTRAMUSCULAR | Status: DC | PRN
  Administered 2015-06-18: 40 ug via INTRAVENOUS
  Administered 2015-06-18: 80 ug via INTRAVENOUS
  Administered 2015-06-18: 40 ug via INTRAVENOUS
  Administered 2015-06-18: 80 ug via INTRAVENOUS

## 2015-06-18 MED ORDER — HEPARIN SODIUM (PORCINE) 1000 UNIT/ML IJ SOLN
INTRAMUSCULAR | Status: AC
  Filled 2015-06-18: qty 1

## 2015-06-18 MED ORDER — MIDAZOLAM HCL 2 MG/2ML IJ SOLN
INTRAMUSCULAR | Status: AC
  Filled 2015-06-18: qty 4

## 2015-06-18 MED ORDER — LIDOCAINE HCL (CARDIAC) 20 MG/ML IV SOLN
INTRAVENOUS | Status: DC | PRN
  Administered 2015-06-18: 100 mg via INTRAVENOUS

## 2015-06-18 MED ORDER — LIDOCAINE HCL (PF) 1 % IJ SOLN
INTRAMUSCULAR | Status: AC
  Filled 2015-06-18: qty 30

## 2015-06-18 MED ORDER — 0.9 % SODIUM CHLORIDE (POUR BTL) OPTIME
TOPICAL | Status: DC | PRN
  Administered 2015-06-18: 1000 mL

## 2015-06-18 MED ORDER — FENTANYL CITRATE (PF) 100 MCG/2ML IJ SOLN
INTRAMUSCULAR | Status: DC | PRN
Start: 2015-06-18 — End: 2015-06-18
  Administered 2015-06-18 (×3): 50 ug via INTRAVENOUS

## 2015-06-18 MED ORDER — HEPARIN SODIUM (PORCINE) 1000 UNIT/ML IJ SOLN
INTRAMUSCULAR | Status: DC | PRN
  Administered 2015-06-18: 5000 [IU] via INTRAVENOUS

## 2015-06-18 MED ORDER — SODIUM CHLORIDE 0.9 % IJ SOLN
INTRAMUSCULAR | Status: AC
  Filled 2015-06-18: qty 10

## 2015-06-18 MED ORDER — OXYCODONE-ACETAMINOPHEN 5-325 MG PO TABS: 1.0000 | ORAL_TABLET | Freq: Four times a day (QID) | ORAL | Status: DC | PRN

## 2015-06-18 MED ORDER — ONDANSETRON HCL 4 MG/2ML IJ SOLN
INTRAMUSCULAR | Status: DC | PRN
Start: 2015-06-18 — End: 2015-06-18
  Administered 2015-06-18: 4 mg via INTRAVENOUS

## 2015-06-18 MED ORDER — PHENYLEPHRINE 40 MCG/ML (10ML) SYRINGE FOR IV PUSH (FOR BLOOD PRESSURE SUPPORT)
PREFILLED_SYRINGE | INTRAVENOUS | Status: AC
  Filled 2015-06-18: qty 10

## 2015-06-18 MED ORDER — EPHEDRINE SULFATE 50 MG/ML IJ SOLN
INTRAMUSCULAR | Status: AC
  Filled 2015-06-18: qty 1

## 2015-06-18 MED ORDER — FENTANYL CITRATE (PF) 250 MCG/5ML IJ SOLN
INTRAMUSCULAR | Status: AC
  Filled 2015-06-18: qty 5

## 2015-06-18 MED ORDER — PROPOFOL 10 MG/ML IV BOLUS
INTRAVENOUS | Status: DC | PRN
  Administered 2015-06-18: 150 mg via INTRAVENOUS
  Administered 2015-06-18: 30 mg via INTRAVENOUS

## 2015-06-18 MED ORDER — SODIUM CHLORIDE 0.9 % IR SOLN
Status: DC | PRN
  Administered 2015-06-18: 500 mL

## 2015-06-18 MED ORDER — ONDANSETRON HCL 4 MG/2ML IJ SOLN
INTRAMUSCULAR | Status: AC
  Filled 2015-06-18: qty 2

## 2015-06-18 MED ORDER — THROMBIN 20000 UNITS EX SOLR
CUTANEOUS | Status: AC
  Filled 2015-06-18: qty 20000

## 2015-06-18 MED ORDER — PROPOFOL 10 MG/ML IV BOLUS
INTRAVENOUS | Status: AC
  Filled 2015-06-18: qty 20

## 2015-06-18 MED ORDER — LIDOCAINE HCL (CARDIAC) 20 MG/ML IV SOLN
INTRAVENOUS | Status: AC
  Filled 2015-06-18: qty 5

## 2015-06-18 MED ORDER — PROPOFOL INFUSION 10 MG/ML OPTIME
INTRAVENOUS | Status: DC | PRN
Start: 2015-06-18 — End: 2015-06-18
  Administered 2015-06-18: 120 ug/kg/min via INTRAVENOUS

## 2015-06-18 MED ORDER — LIDOCAINE HCL (PF) 1 % IJ SOLN: INTRAMUSCULAR | Status: DC | PRN

## 2015-06-18 MED ORDER — HYDROMORPHONE HCL 1 MG/ML IJ SOLN
INTRAMUSCULAR | Status: AC
  Filled 2015-06-18: qty 1

## 2015-06-18 MED ORDER — MIDAZOLAM HCL 5 MG/5ML IJ SOLN
INTRAMUSCULAR | Status: DC | PRN
  Administered 2015-06-18: 2 mg via INTRAVENOUS

## 2015-06-18 MED ORDER — HYDROMORPHONE HCL 1 MG/ML IJ SOLN
0.2500 mg | INTRAMUSCULAR | Status: DC | PRN
  Administered 2015-06-18 (×2): 0.5 mg via INTRAVENOUS

## 2015-06-18 SURGICAL SUPPLY — 40 items
ARMBAND PINK RESTRICT EXTREMIT (MISCELLANEOUS) ×3 IMPLANT
CANISTER SUCTION 2500CC (MISCELLANEOUS) ×3 IMPLANT
CANNULA VESSEL 3MM 2 BLNT TIP (CANNULA) ×3 IMPLANT
CLIP TI MEDIUM 6 (CLIP) ×3 IMPLANT
CLIP TI WIDE RED SMALL 6 (CLIP) ×3 IMPLANT
COVER PROBE W GEL 5X96 (DRAPES) ×3 IMPLANT
DECANTER SPIKE VIAL GLASS SM (MISCELLANEOUS) ×3 IMPLANT
DRAIN PENROSE 1/4X12 LTX STRL (WOUND CARE) ×3 IMPLANT
DRAPE ORTHO SPLIT 77X108 STRL (DRAPES) ×2
DRAPE PROXIMA HALF (DRAPES) ×3 IMPLANT
DRAPE SURG ORHT 6 SPLT 77X108 (DRAPES) ×1 IMPLANT
DRAPE U-SHAPE 47X51 STRL (DRAPES) ×3 IMPLANT
ELECT REM PT RETURN 9FT ADLT (ELECTROSURGICAL) ×3
ELECTRODE REM PT RTRN 9FT ADLT (ELECTROSURGICAL) ×1 IMPLANT
GLOVE BIO SURGEON STRL SZ 6.5 (GLOVE) ×4 IMPLANT
GLOVE BIO SURGEON STRL SZ7.5 (GLOVE) ×3 IMPLANT
GLOVE BIO SURGEONS STRL SZ 6.5 (GLOVE) ×2
GLOVE BIOGEL PI IND STRL 6.5 (GLOVE) ×5 IMPLANT
GLOVE BIOGEL PI IND STRL 7.0 (GLOVE) ×2 IMPLANT
GLOVE BIOGEL PI INDICATOR 6.5 (GLOVE) ×10
GLOVE BIOGEL PI INDICATOR 7.0 (GLOVE) ×4
GOWN STRL REUS W/ TWL LRG LVL3 (GOWN DISPOSABLE) ×3 IMPLANT
GOWN STRL REUS W/TWL LRG LVL3 (GOWN DISPOSABLE) ×6
KIT BASIN OR (CUSTOM PROCEDURE TRAY) ×3 IMPLANT
KIT ROOM TURNOVER OR (KITS) ×3 IMPLANT
LIQUID BAND (GAUZE/BANDAGES/DRESSINGS) ×3 IMPLANT
LOOP VESSEL MINI RED (MISCELLANEOUS) IMPLANT
NS IRRIG 1000ML POUR BTL (IV SOLUTION) ×3 IMPLANT
PACK CV ACCESS (CUSTOM PROCEDURE TRAY) ×3 IMPLANT
PAD ARMBOARD 7.5X6 YLW CONV (MISCELLANEOUS) ×6 IMPLANT
SPONGE SURGIFOAM ABS GEL 100 (HEMOSTASIS) IMPLANT
STOCKINETTE 6  STRL (DRAPES) ×2
STOCKINETTE 6 STRL (DRAPES) ×1 IMPLANT
SUT PROLENE 6 0 BV (SUTURE) IMPLANT
SUT PROLENE 7 0 BV 1 (SUTURE) ×3 IMPLANT
SUT VIC AB 3-0 SH 27 (SUTURE) ×2
SUT VIC AB 3-0 SH 27X BRD (SUTURE) ×1 IMPLANT
SUT VICRYL 4-0 PS2 18IN ABS (SUTURE) ×3 IMPLANT
UNDERPAD 30X30 INCONTINENT (UNDERPADS AND DIAPERS) ×3 IMPLANT
WATER STERILE IRR 1000ML POUR (IV SOLUTION) ×3 IMPLANT

## 2015-06-18 NOTE — H&P (View-Only) (Signed)
[facility] of [place]  Patient name: Terrance Reynolds MRN: [id number] DOB: 25-Feb-1981 Sex: male  REASON FOR CONSULT: Evaluate for new hemodialysis access.  HPI: Terrance Reynolds is a 34 y.o. 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 He was referred by Dr. Lowanda Foster for evaluation for hemodialysis access. He has a tunneled dialysis catheter. He is right-handed. He dialyzes on Tuesdays Thursdays and Saturdays. He has had some uremic symptoms including mild nausea, vomiting, fatigue, and anorexia.  I have reviewed the records from the referring doctor's office. Creatinine on 04/15/2015 was 6.98.  Past Medical History  Diagnosis Date  . Psoriasis   . Hypertension   . Substance abuse     methadone clinic  . Anemia   . Thrombocytopenia   . Acute on chronic renal failure   . CKD (chronic kidney disease)   . Methadone maintenance therapy patient   . Diastolic dysfunction 0000000.    Grade 2 per echo   Family History  Problem Relation Age of Onset  . Diabetes Mother   . Heart disease Father   . Hypertension Father    SOCIAL HISTORY: Social History  Substance Use Topics  . Smoking status: Current Every Day Smoker -- 0.50 packs/day  . Smokeless tobacco: Not on file  . Alcohol Use: No   Allergies  Allergen Reactions  . Penicillins Other (See Comments)    Child hood allergy   Current Outpatient Prescriptions  Medication Sig Dispense Refill  . aspirin 325 MG tablet Take 325-650 mg by mouth daily as needed for headache.    . furosemide (LASIX) 80 MG tablet Take 80 mg by mouth daily.    . hydrALAZINE (APRESOLINE) 50 MG tablet Take 1 tablet (50 mg total) by mouth every 8 (eight) hours.    [name] labetalol (NORMODYNE) 300 MG tablet Take 1 tablet (300 mg total) by mouth 2 (two) times daily.    . methadone (DOLOPHINE) 10 MG tablet Take 75 mg by mouth daily.    . Multiple Vitamin (MULTIVITAMIN WITH MINERALS) TABS tablet Take 1 tablet by  mouth daily.    [name] NIFEdipine (PROCARDIA XL/ADALAT-CC) 90 MG 24 hr tablet Take 1 tablet (90 mg total) by mouth daily.    . promethazine (PHENERGAN) 25 MG tablet Take 1 tablet (25 mg total) by mouth every 6 (six) hours as needed for nausea or vomiting.    . sevelamer carbonate (RENVELA) 800 MG tablet Take 800 mg by mouth 3 (three) times daily with meals.    . calcium acetate (PHOSLO) 667 MG capsule Take 1 capsule (667 mg total) by mouth 3 (three) times daily with meals. (Patient not taking: Reported on 06/13/2015) 90 capsule 1  . nicotine (NICODERM CQ - DOSED IN MG/24 HOURS) 14 mg/24hr patch Place 1 patch (14 mg total) onto the skin daily. (Patient not taking: Reported on 06/13/2015)    . sodium bicarbonate 650 MG tablet Take 1 tablet (650 mg total) by mouth 2 (two) times daily. (Patient not taking: Reported on 06/13/2015) 60 tablet 1   No current facility-administered medications for this visit.   REVIEW OF SYSTEMS: Valu.Nieves ] denotes positive finding; [  ] denotes negative finding  CARDIOVASCULAR:  [ ]  chest pain   [ ]  chest pressure   [ ]  palpitations   [ ]  orthopnea   [ ]  dyspnea on exertion   [ ]  claudication   [ ]  rest pain   [ ]  DVT   [ ]  phlebitis  PULMONARY:   [ ]  productive cough   [ ]  asthma   [ ]  wheezing NEUROLOGIC:   [ ]  weakness  [ ]  paresthesias  [ ]  aphasia  [ ]  amaurosis  [ ]  dizziness HEMATOLOGIC:   [ ]  bleeding problems   [ ]  clotting disorders MUSCULOSKELETAL:  [ ]  joint pain   [ ]  joint swelling [ ]  leg swelling GASTROINTESTINAL: [ ]   blood in stool  [ ]   hematemesis GENITOURINARY:  [ ]   dysuria  [ ]   hematuria PSYCHIATRIC:  [ ]  history of major depression INTEGUMENTARY:  [ ]  rashes  [ ]  ulcers CONSTITUTIONAL:  [ ]  fever   [ ]  chills  PHYSICAL EXAM: Filed Vitals:   06/13/15 1156  BP: 133/82  Pulse: 77  Temp: 98.2 F (36.8 C)  Resp: 14  Height: 6\' 2"  (1.88 m)  Weight: 178 lb (80.74 kg)  SpO2: 96%   GENERAL: The patient is a well-nourished male, in no acute distress. The  vital signs are documented above. CARDIAC: There is a regular rate and rhythm.  VASCULAR: I did not detect carotid bruits. He has palpable radial pulses. He has no significant lower extremity swelling. PULMONARY: There is good air exchange bilaterally without wheezing or rales. ABDOMEN: Soft and non-tender with normal pitched bowel sounds.  MUSCULOSKELETAL: There are no major deformities or cyanosis. NEUROLOGIC: No focal weakness or paresthesias are detected. SKIN: There are no ulcers or rashes noted. On exam he appears to have a reasonable forearm cephalic vein on the left. PSYCHIATRIC: The patient has a normal affect.  DATA:  ARTERIAL DUPLEX: He has triphasic Doppler signals in the radial and ulnar positions bilaterally. Brachial artery bifurcation is in the antecubital fossa bilaterally.  UPPER EXTREME VEIN MAPPING: On the right side, the forearm and upper arm cephalic vein looked marginal in size. The right basilic vein looks reasonable in size. On the left side the forearm and upper arm cephalic vein looked marginal in size. The basilic vein looks reasonable in size.  MEDICAL ISSUES:  STAGE V CHRONIC KIDNEY DISEASE: the patient appears to be a reasonable candidate for an AV fistula in the left arm. On exam the forearm cephalic vein looks reasonable. I've explained the second choice would be an upper arm brachiocephalic fistula. The third choice would be a basilic vein transposition. I have explained the indications for placement of an AV fistula or AV graft. I've explained that if at all possible we will place an AV fistula.  I have reviewed the risks of placement of an AV fistula including but not limited to: failure of the fistula to mature, need for subsequent interventions, and thrombosis. In addition I have reviewed the potential complications of placement of an AV graft. These risks include, but are not limited to, graft thrombosis, graft infection, wound healing problems, bleeding, arm  swelling, and steal syndrome. All the patient's questions were answered and they are agreeable to proceed with surgery. Since he dialyzes on Tuesdays Thursdays and Saturdays the first Friday that I have is September 23. This reason we'll try to get him done sooner by one of my partners on a Monday Wednesday or Friday.   Deitra Mayo [facility] [place]: [phone]

## 2015-06-18 NOTE — Op Note (Signed)
Procedure: Left Radial Cephalic AV fistula   Preop: ESRD   Postop: ESRD   Anesthesia: General  Assistant:  Silva Bandy, PA-c   Findings: 3 mm cephalic vein       3 mm radial artery   Procedure Details:  The left upper extremity was prepped and draped in usual sterile fashion. Initial plan had been for MAC anesthesia but the patient became quite agitated and a general anesthetic was commenced.   A longitudinal skin incision was then made at the distal left forearm between the cephalic vein and the radial artery. The incision was carried into the subcutaneous tissues down to level cephalic vein. The vein had some spasm but was overall reasonable quality accepting a 3 mm dilator. The vein was dissected free circumferentially and small side branches ligated and divided between silk ties. The distal end was ligated and the vein probed and found to accept up to a 3 mm dilator. This was gently distended with heparinized saline, spatulated, and marked for orientation. Next the radial artery was dissected free in the medial portion incision. The artery was 3 mm in diameter but had a reasonable pulse. The vessel loops were placed proximal and distal to the planned site of arteriotomy. The patient was given 5000 units of intravenous heparin. After appropriate circulation time, the vessel loops were used to control the artery. A longitudinal opening was made in the left radial artery. The vein was controlled proximally with a fine bulldog clamp. The vein was then swung over to the artery and sewn end of vein to side of artery using a running 7-0 Prolene suture. Just prior to completion, the anastomosis was fore bled back bled and thoroughly flushed. The anastomosis was secured, vessel loops released, and there was a palpable thrill in the fistula immediately. After hemostasis was obtained, the subcutaneous tissues were reapproximated using a running 3-0 Vicryl suture. The skin was then closed with a 4 Vicryl  subcuticular stitch. Dermabond was applied to the skin incision. The patient tolerated the procedure well and there were no complications. Instrument sponge and needle count were correct at the end of the case. The patient was taken to PACU in stable condition.   Ruta Hinds, MD  [facility] [place]  Office: [phone]  Pager: [phone]

## 2015-06-18 NOTE — Interval H&P Note (Signed)
History and Physical Interval Note:  06/18/2015 10:14 AM  Terrance Reynolds  has presented today for surgery, with the diagnosis of Stage V Chronic Kidney Disease N18.5  The various methods of treatment have been discussed with the patient and family. After consideration of risks, benefits and other options for treatment, the patient has consented to  Procedure(s): ARTERIOVENOUS (AV) FISTULA CREATION (Left) as a surgical intervention .  The patient's history has been reviewed, patient examined, no change in status, stable for surgery.  I have reviewed the patient's chart and labs.  Questions were answered to the patient's satisfaction.     Ruta Hinds

## 2015-06-18 NOTE — Transfer of Care (Signed)
Immediate Anesthesia Transfer of Care Note  Patient: Terrance Reynolds  Procedure(s) Performed: Procedure(s): LEFT RADIOCEPHALIC ARTERIOVENOUS (AV) FISTULA CREATION (Left)  Patient Location: PACU  Anesthesia Type:General  Level of Consciousness: sedated and patient cooperative  Airway & Oxygen Therapy: Patient Spontanous Breathing and Patient connected to face mask oxygen  Post-op Assessment: Report given to RN and Post -op Vital signs reviewed and stable  Post vital signs: Reviewed  Last Vitals:  Filed Vitals:   06/18/15 1215  BP: 134/84  Pulse:   Temp: [id number] C    Complications: No apparent anesthesia complications

## 2015-06-18 NOTE — Anesthesia Procedure Notes (Signed)
Procedure Name: LMA Insertion Date/Time: 06/18/2015 10:44 AM Performed by: Luciana Axe K Pre-anesthesia Checklist: Patient identified, Emergency Drugs available, Suction available, Patient being monitored and Timeout performed Patient Re-evaluated:Patient Re-evaluated prior to inductionOxygen Delivery Method: Circle system utilized Preoxygenation: Pre-oxygenation with 100% oxygen Intubation Type: IV induction Ventilation: Mask ventilation without difficulty LMA: LMA inserted LMA Size: 5.0 Number of attempts: 1 Placement Confirmation: positive ETCO2,  CO2 detector and breath sounds checked- equal and bilateral Tube secured with: Tape Dental Injury: Teeth and Oropharynx as per pre-operative assessment

## 2015-06-18 NOTE — Anesthesia Postprocedure Evaluation (Signed)
  Anesthesia Post-op Note  Patient: Terrance Reynolds  Procedure(s) Performed: Procedure(s) (LRB): LEFT RADIOCEPHALIC ARTERIOVENOUS (AV) FISTULA CREATION (Left)  Patient Location: PACU  Anesthesia Type: General  Level of Consciousness: awake and alert   Airway and Oxygen Therapy: Patient Spontanous Breathing  Post-op Pain: mild  Post-op Assessment: Post-op Vital signs reviewed, Patient's Cardiovascular Status Stable, Respiratory Function Stable, Patent Airway and No signs of Nausea or vomiting  Last Vitals:  Filed Vitals:   06/18/15 1345  BP:   Pulse:   Temp: 36.8 C  Resp:     Post-op Vital Signs: stable   Complications: No apparent anesthesia complications

## 2015-06-18 NOTE — Anesthesia Preprocedure Evaluation (Addendum)
Anesthesia Evaluation  Patient identified by MRN, date of birth, ID band Patient awake    Reviewed: Allergy & Precautions, H&P , NPO status , Patient's Chart, lab work & pertinent test results, reviewed documented beta blocker date and time   Airway Mallampati: II  TM Distance: >3 FB Neck ROM: Full    Dental no notable dental hx. (+) Poor Dentition, Dental Advisory Given   Pulmonary Current Smoker,  breath sounds clear to auscultation  Pulmonary exam normal       Cardiovascular hypertension, Pt. on medications and Pt. on home beta blockers Rhythm:Regular Rate:Normal     Neuro/Psych  Headaches, negative psych ROS   GI/Hepatic negative GI ROS, (+)     substance abuse  , Hepatitis -, C  Endo/Other  negative endocrine ROS  Renal/GU ESRF and DialysisRenal disease  negative genitourinary   Musculoskeletal   Abdominal   Peds  Hematology negative hematology ROS (+)   Anesthesia Other Findings   Reproductive/Obstetrics negative OB ROS                            Anesthesia Physical Anesthesia Plan  ASA: III  Anesthesia Plan: MAC   Post-op Pain Management:    Induction: Intravenous  Airway Management Planned: Simple Face Mask  Additional Equipment:   Intra-op Plan:   Post-operative Plan: Extubation in OR  Informed Consent: I have reviewed the patients History and Physical, chart, labs and discussed the procedure including the risks, benefits and alternatives for the proposed anesthesia with the patient or authorized representative who has indicated his/her understanding and acceptance.   Dental advisory given  Plan Discussed with: CRNA  Anesthesia Plan Comments:        Anesthesia Quick Evaluation

## 2015-06-19 ENCOUNTER — Encounter (HOSPITAL_COMMUNITY): Payer: Self-pay | Admitting: Vascular Surgery

## 2015-06-19 ENCOUNTER — Other Ambulatory Visit: Payer: Self-pay | Admitting: *Deleted

## 2015-06-19 DIAGNOSIS — Z4931 Encounter for adequacy testing for hemodialysis: Secondary | ICD-10-CM

## 2015-06-19 DIAGNOSIS — N186 End stage renal disease: Secondary | ICD-10-CM

## 2015-06-20 ENCOUNTER — Telehealth: Payer: Self-pay | Admitting: Vascular Surgery

## 2015-06-20 NOTE — Telephone Encounter (Addendum)
-----   Message from Mena Goes, RN sent at 06/19/2015 11:35 AM EDT ----- Regarding: Schedule   ----- Message -----    From: Alvia Grove, PA-C    Sent: 06/18/2015  11:49 AM      To: Vvs Charge Pool  S/p left radiocephalic AVF [id number]  F/u with Dr. Oneida Alar in 6 weeks with duplex.  Thanks Kim  notified patient of post op appt. on 07-31-15 for lab and then on 08-07-15 at 10:30 with dr. Oneida Alar

## 2015-06-26 ENCOUNTER — Telehealth: Payer: Self-pay

## 2015-06-26 NOTE — Telephone Encounter (Signed)
Rec'd call from nurse at [facility].  Reported that she can only hear a faint bruit directly over the incision of left FA AVF.  Stated she cannot detect a bruit in any of the surrounding area of the fistula.  Questioned if Dr. Oneida Alar could recommend where to check for bruit?  Per Dr. Oneida Alar, schedule office appt. to check left arm AVF with nurse practitioner.  Phone call to pt.  Offered appt. 06/27/15 at 9:30 AM.  Stated he cannot come to office tomorrow.  Is requesting appt. Next week on a Tuesday, or Thursday.  Appt. offered at 10:30 AM 07/03/15.  Agreed.

## 2015-07-01 ENCOUNTER — Encounter: Payer: Self-pay | Admitting: Family

## 2015-07-03 ENCOUNTER — Ambulatory Visit (INDEPENDENT_AMBULATORY_CARE_PROVIDER_SITE_OTHER): Payer: Self-pay | Admitting: Family

## 2015-07-03 ENCOUNTER — Encounter: Payer: Self-pay | Admitting: Family

## 2015-07-03 VITALS — BP 133/75 | HR 80 | Temp 97.9°F | Resp 16 | Ht 74.0 in | Wt 184.0 lb

## 2015-07-03 DIAGNOSIS — T82590A Other mechanical complication of surgically created arteriovenous fistula, initial encounter: Secondary | ICD-10-CM

## 2015-07-03 DIAGNOSIS — N186 End stage renal disease: Secondary | ICD-10-CM

## 2015-07-03 DIAGNOSIS — R200 Anesthesia of skin: Secondary | ICD-10-CM | POA: Insufficient documentation

## 2015-07-03 NOTE — Patient Instructions (Signed)
AV Fistula, Care After Refer to this sheet in the next few weeks. These instructions provide you with information on caring for yourself after your procedure. Your caregiver may also give you more specific instructions. Your treatment has been planned according to current medical practices, but problems sometimes occur. Call your caregiver if you have any problems or questions after your procedure. HOME CARE INSTRUCTIONS   Do not drive a car or take public transportation alone.  Do not drink alcohol.  Only take medicine that has been prescribed by your caregiver.  Do not sign important papers or make important decisions.  Have a responsible person with you.  Ask your caregiver to show you how to check your access at home for a vibration (called a "thrill") or for a sound (called a "bruit" pronounced brew-ee).  Your vein will need time to enlarge and mature so needles can be inserted for dialysis. Follow your caregiver's instructions about what you need to do to make this happen.  Keep dressings clean and dry.  Keep the arm elevated above your heart. Use a pillow.  Rest.  Use the arm as usual for all activities.  Have the stitches or tape closures removed in 10 to 14 days, or as directed by your caregiver.  Do not sleep or lie on the area of the fistula or that arm. This may decrease or stop the blood flow through your fistula.  Do not allow blood pressures to be taken on this arm.  Do not allow blood drawing to be done from the graft.  Do not wear tight clothing around the access site or on the arm.  Avoid lifting heavy objects with the arm that has the fistula.  Do not use creams or lotions over the access site. SEEK MEDICAL CARE IF:   You have a fever.  You have swelling around the fistula that gets worse, or you have new pain.  You have unusual bleeding at the fistula site or from any other area.  You have pus or other drainage at the fistula site.  You have skin  redness or red streaking on the skin around, above, or below the fistula site.  Your access site feels warm.  You have any flu-like symptoms. SEEK IMMEDIATE MEDICAL CARE IF:   You have pain, numbness, or an unusual pale skin on the hand or on the side of your fistula.  You have dizziness or weakness that you have not had before.  You have shortness of breath. You have chest pain.Smoking Cessation Quitting smoking is important to your health and has many advantages. However, it is not always easy to quit since nicotine is a very addictive drug. Oftentimes, people try 3 times or more before being able to quit. This document explains the best ways for you to prepare to quit smoking. Quitting takes hard work and a lot of effort, but you can do it. ADVANTAGES OF QUITTING SMOKING You will live longer, feel better, and live better. Your body will feel the impact of quitting smoking almost immediately. Within 20 minutes, blood pressure decreases. Your pulse returns to its normal level. After 8 hours, carbon monoxide levels in the blood return to normal. Your oxygen level increases. After 24 hours, the chance of having a heart attack starts to decrease. Your breath, hair, and body stop smelling like smoke. After 48 hours, damaged nerve endings begin to recover. Your sense of taste and smell improve. After 72 hours, the body is virtually free of nicotine.  Your bronchial tubes relax and breathing becomes easier. After 2 to 12 weeks, lungs can hold more air. Exercise becomes easier and circulation improves. The risk of having a heart attack, stroke, cancer, or lung disease is greatly reduced. After 1 year, the risk of coronary heart disease is cut in half. After 5 years, the risk of stroke falls to the same as a nonsmoker. After 10 years, the risk of lung cancer is cut in half and the risk of other cancers decreases significantly. After 15 years, the risk of coronary heart disease drops, usually to  the level of a nonsmoker. If you are pregnant, quitting smoking will improve your chances of having a healthy baby. The people you live with, especially any children, will be healthier. You will have extra money to spend on things other than cigarettes. QUESTIONS TO THINK ABOUT BEFORE ATTEMPTING TO QUIT You may want to talk about your answers with your health care provider. Why do you want to quit? If you tried to quit in the past, what helped and what did not? What will be the most difficult situations for you after you quit? How will you plan to handle them? Who can help you through the tough times? Your family? Friends? A health care provider? What pleasures do you get from smoking? What ways can you still get pleasure if you quit? Here are some questions to ask your health care provider: How can you help me to be successful at quitting? What medicine do you think would be best for me and how should I take it? What should I do if I need more help? What is smoking withdrawal like? How can I get information on withdrawal? GET READY Set a quit date. Change your environment by getting rid of all cigarettes, ashtrays, matches, and lighters in your home, car, or work. Do not let people smoke in your home. Review your past attempts to quit. Think about what worked and what did not. GET SUPPORT AND ENCOURAGEMENT You have a better chance of being successful if you have help. You can get support in many ways. Tell your family, friends, and coworkers that you are going to quit and need their support. Ask them not to smoke around you. Get individual, group, or telephone counseling and support. Programs are available at [employer] and health centers. Call your local health department for information about programs in your area. Spiritual beliefs and practices may help some smokers quit. Download a "quit meter" on your computer to keep track of quit statistics, such as how long you have gone  without smoking, cigarettes not smoked, and money saved. Get a self-help book about quitting smoking and staying off tobacco. [place] yourself from urges to smoke. Talk to someone, go for a walk, or occupy your time with a task. Change your normal routine. Take a different route to work. Drink tea instead of coffee. Eat breakfast in a different place. Reduce your stress. Take a hot bath, exercise, or read a book. Plan something enjoyable to do every day. Reward yourself for not smoking. Explore interactive web-based programs that specialize in helping you quit. GET MEDICINE AND USE IT CORRECTLY Medicines can help you stop smoking and decrease the urge to smoke. Combining medicine with the above behavioral methods and support can greatly increase your chances of successfully quitting smoking. Nicotine replacement therapy helps deliver nicotine to your body without the negative effects and risks of smoking. Nicotine replacement therapy includes nicotine  gum, lozenges, inhalers, nasal sprays, and skin patches. Some may be available over-the-counter and others require a prescription. Antidepressant medicine helps people abstain from smoking, but how this works is unknown. This medicine is available by prescription. Nicotinic receptor partial agonist medicine simulates the effect of nicotine in your brain. This medicine is available by prescription. Ask your health care provider for advice about which medicines to use and how to use them based on your health history. Your health care provider will tell you what side effects to look out for if you choose to be on a medicine or therapy. Carefully read the information on the package. Do not use any other product containing nicotine while using a nicotine replacement product.  RELAPSE OR DIFFICULT SITUATIONS Most relapses occur within the first 3 months after quitting. Do not be discouraged if you start smoking again. Remember,  most people try several times before finally quitting. You may have symptoms of withdrawal because your body is used to nicotine. You may crave cigarettes, be irritable, feel very hungry, cough often, get headaches, or have difficulty concentrating. The withdrawal symptoms are only temporary. They are strongest when you first quit, but they will go away within 10-14 days. To reduce the chances of relapse, try to: Avoid drinking alcohol. Drinking lowers your chances of successfully quitting. Reduce the amount of caffeine you consume. Once you quit smoking, the amount of caffeine in your body increases and can give you symptoms, such as a rapid heartbeat, sweating, and anxiety. Avoid smokers because they can make you want to smoke. Do not let weight gain distract you. Many smokers will gain weight when they quit, usually less than 10 pounds. Eat a healthy diet and stay active. You can always lose the weight gained after you quit. Find ways to improve your mood other than smoking. FOR MORE INFORMATION  www.smokefree.gov  Document Released: 09/28/2001 Document Revised: 02/18/2014 Document Reviewed: 01/13/2012 [facility] Patient Information 2015 [place], [place]. This information is not intended to replace advice given to you by your health care provider. Make sure you discuss any questions you have with your health care provider.    Your fistula disconnects or breaks, and there is bleeding that cannot be easily controlled. Call for local emergency medical help. Do not try to drive yourself to the hospital. MAKE SURE YOU  Understand these instructions.  Will watch your condition.  Will get help right away if you are not doing well or get worse. Document Released: 10/04/2005 Document Revised: 02/18/2014 Document Reviewed: 03/24/2011 [facility] Patient Information 2015 [place], [place]. This information is not intended to replace advice given to you by your health care provider. Make sure you discuss any  questions you have with your health care provider.    Smoking Cessation, Tips for Success If you are ready to quit smoking, congratulations! You have chosen to help yourself be healthier. Cigarettes bring nicotine, tar, carbon monoxide, and other irritants into your body. Your lungs, heart, and blood vessels will be able to work better without these poisons. There are many different ways to quit smoking. Nicotine gum, nicotine patches, a nicotine inhaler, or nicotine nasal spray can help with physical craving. Hypnosis, support groups, and medicines help break the habit of smoking. WHAT THINGS CAN I DO TO MAKE QUITTING EASIER?  Here are some tips to help you quit for good:  Pick a date when you will quit smoking completely. Tell all of your friends and family about your plan to quit on that date.  Do not try to slowly cut down on the number of cigarettes you are smoking. Pick a quit date and quit smoking completely starting on that day.  Throw away all cigarettes.   Clean and remove all ashtrays from your home, work, and car.  On a card, write down your reasons for quitting. Carry the card with you and read it when you get the urge to smoke.  Cleanse your body of nicotine. Drink enough water and fluids to keep your urine clear or pale yellow. Do this after quitting to flush the nicotine from your body.  Learn to predict your moods. Do not let a bad situation be your excuse to have a cigarette. Some situations in your life might tempt you into wanting a cigarette.  Never have "just one" cigarette. It leads to wanting another and another. Remind yourself of your decision to quit.  Change habits associated with smoking. If you smoked while driving or when feeling stressed, try other activities to replace smoking. Stand up when drinking your coffee. Brush your teeth after eating. Sit in a different chair when you read the paper. Avoid alcohol while trying to quit, and try to drink fewer  caffeinated beverages. Alcohol and caffeine may urge you to smoke.  Avoid foods and drinks that can trigger a desire to smoke, such as sugary or spicy foods and alcohol.  Ask people who smoke not to smoke around you.  Have something planned to do right after eating or having a cup of coffee. For example, plan to take a walk or exercise.  Try a relaxation exercise to calm you down and decrease your stress. Remember, you may be tense and nervous for the first 2 weeks after you quit, but this will pass.  Find new activities to keep your hands busy. Play with a pen, coin, or rubber band. Doodle or draw things on paper.  Brush your teeth right after eating. This will help cut down on the craving for the taste of tobacco after meals. You can also try mouthwash.   Use oral substitutes in place of cigarettes. Try using lemon drops, carrots, cinnamon sticks, or chewing gum. Keep them handy so they are available when you have the urge to smoke.  When you have the urge to smoke, try deep breathing.  Designate your home as a nonsmoking area.  If you are a heavy smoker, ask your health care provider about a prescription for nicotine chewing gum. It can ease your withdrawal from nicotine.  Reward yourself. Set aside the cigarette money you save and buy yourself something nice.  Look for support from others. Join a support group or smoking cessation program. Ask someone at home or at work to help you with your plan to quit smoking.  Always ask yourself, "Do I need this cigarette or is this just a reflex?" Tell yourself, "Today, I choose not to smoke," or "I do not want to smoke." You are reminding yourself of your decision to quit.  Do not replace cigarette smoking with electronic cigarettes (commonly called e-cigarettes). The safety of e-cigarettes is unknown, and some may contain harmful chemicals.  If you relapse, do not give up! Plan ahead and think about what you will do the next time you get  the urge to smoke. HOW WILL I FEEL WHEN I QUIT SMOKING? You may have symptoms of withdrawal because your body is used to nicotine (the addictive substance in cigarettes). You may crave cigarettes, be irritable, feel very hungry, cough  often, get headaches, or have difficulty concentrating. The withdrawal symptoms are only temporary. They are strongest when you first quit but will go away within 10-14 days. When withdrawal symptoms occur, stay in control. Think about your reasons for quitting. Remind yourself that these are signs that your body is healing and getting used to being without cigarettes. Remember that withdrawal symptoms are easier to treat than the major diseases that smoking can cause.  Even after the withdrawal is over, expect periodic urges to smoke. However, these cravings are generally short lived and will go away whether you smoke or not. Do not smoke! WHAT RESOURCES ARE AVAILABLE TO HELP ME QUIT SMOKING? Your health care provider can direct you to community resources or hospitals for support, which may include:  Group support.  Education.  Hypnosis.  Therapy. Document Released: 07/02/2004 Document Revised: 02/18/2014 Document Reviewed: 03/22/2013 [facility] Patient Information 2015 [place], [place]. This information is not intended to replace advice given to you by your health care provider. Make sure you discuss any questions you have with your health care provider.

## 2015-07-03 NOTE — Progress Notes (Signed)
    Postoperative Access Visit   History of Present Illness  Terrance Reynolds is a 34 y.o. year old male patient who is s/p left radial cephalic AV fistula creation on 06/18/15. He returns today with C/O left post-op site absence of thrill for 1-2 weks according to nurse at [facility]. He is dialyzing via a right side chest temporary catheter on Tuesdays Thursdays and Saturdays.   He is right-handed. He denies fever or chills, denies steal symptoms in left hand.  He smokes a half ppd, started smoking at age 83. He does not have DM, but states he had undiagnosed hypertension, then renal failure. 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   For VQI Use Only  PRE-ADM LIVING: Home  AMB STATUS: Ambulatory  Physical Examination Filed Vitals:   07/03/15 1040  BP: 133/75  Pulse: 80  Temp: 97.9 F (36.6 C)  TempSrc: Oral  Resp: 16  Height: 6\' 2"  (1.88 m)  Weight: 184 lb (83.462 kg)  SpO2: 98%   Body mass index is 23.61 kg/(m^2).  Left forearm: Incision is well healed, skin feels normal warmth, hand grip is 5/5, sensation in digits is intact, no palpable thrill, bruit can not be auscultated. Left radial pulse is 2+ palpable.   Medical Decision Making  Terrance Reynolds is a 34 y.o. year old male who presents s/p radial cephalic AV fistula creation on 06/18/15. Dr. Oneida Alar spoke with and examined pt. His eft forearm AV fistula has no palpable thrill and no audible bruit, no signs of infection.  Will schedule for left brachiocephalic AVF creation vs basilic vein transposition on 07/09/15 by Dr. Oneida Alar.   NICKEL, Sharmon Leyden, RN, MSN, FNP-C [facility] Office: [phone]  07/03/2015, 11:01 AM  Clinic MD: Oneida Alar

## 2015-07-04 ENCOUNTER — Other Ambulatory Visit: Payer: Self-pay | Admitting: *Deleted

## 2015-07-08 ENCOUNTER — Emergency Department (HOSPITAL_COMMUNITY)
Admission: EM | Admit: 2015-07-08 | Discharge: 2015-07-08 | Disposition: A | Discharge status: Home / Self Care | Payer: Medicaid Other | Attending: Emergency Medicine | Admitting: Emergency Medicine

## 2015-07-08 ENCOUNTER — Encounter (HOSPITAL_COMMUNITY): Payer: Self-pay | Admitting: *Deleted

## 2015-07-08 ENCOUNTER — Encounter (HOSPITAL_COMMUNITY): Payer: Self-pay | Admitting: Emergency Medicine

## 2015-07-08 DIAGNOSIS — Z7982 Long term (current) use of aspirin: Secondary | ICD-10-CM | POA: Diagnosis not present

## 2015-07-08 DIAGNOSIS — F1120 Opioid dependence, uncomplicated: Secondary | ICD-10-CM | POA: Diagnosis not present

## 2015-07-08 DIAGNOSIS — N189 Chronic kidney disease, unspecified: Secondary | ICD-10-CM | POA: Diagnosis not present

## 2015-07-08 DIAGNOSIS — I129 Hypertensive chronic kidney disease with stage 1 through stage 4 chronic kidney disease, or unspecified chronic kidney disease: Secondary | ICD-10-CM | POA: Insufficient documentation

## 2015-07-08 DIAGNOSIS — Z880 Allergy status to penicillin: Secondary | ICD-10-CM | POA: Diagnosis not present

## 2015-07-08 DIAGNOSIS — Z8719 Personal history of other diseases of the digestive system: Secondary | ICD-10-CM | POA: Insufficient documentation

## 2015-07-08 DIAGNOSIS — Z862 Personal history of diseases of the blood and blood-forming organs and certain disorders involving the immune mechanism: Secondary | ICD-10-CM | POA: Diagnosis not present

## 2015-07-08 DIAGNOSIS — M545 Low back pain, unspecified: Secondary | ICD-10-CM

## 2015-07-08 DIAGNOSIS — Z872 Personal history of diseases of the skin and subcutaneous tissue: Secondary | ICD-10-CM | POA: Diagnosis not present

## 2015-07-08 DIAGNOSIS — Z79899 Other long term (current) drug therapy: Secondary | ICD-10-CM | POA: Diagnosis not present

## 2015-07-08 DIAGNOSIS — Z720 Tobacco use: Secondary | ICD-10-CM | POA: Diagnosis not present

## 2015-07-08 LAB — URINALYSIS, ROUTINE W REFLEX MICROSCOPIC
Bilirubin Urine: NEGATIVE
GLUCOSE, UA: NEGATIVE mg/dL
Hgb urine dipstick: NEGATIVE
KETONES UR: NEGATIVE mg/dL
LEUKOCYTES UA: NEGATIVE
NITRITE: NEGATIVE
PH: 6 (ref 5.0–8.0)
PROTEIN: 100 mg/dL — AB
Specific Gravity, Urine: 1.02 (ref 1.005–1.030)
Urobilinogen, UA: 0.2 mg/dL (ref 0.0–1.0)

## 2015-07-08 LAB — URINE MICROSCOPIC-ADD ON

## 2015-07-08 MED ORDER — VANCOMYCIN HCL IN DEXTROSE 1-5 GM/200ML-% IV SOLN
1000.0000 mg | INTRAVENOUS | Status: AC
  Administered 2015-07-09: 1000 mg via INTRAVENOUS
  Filled 2015-07-08: qty 200

## 2015-07-08 NOTE — Discharge Instructions (Signed)

## 2015-07-08 NOTE — Progress Notes (Signed)
Pt denies any chest pain or sob. Pt in a treatment center, on Methadone maintenance therapy.

## 2015-07-08 NOTE — ED Notes (Signed)
Pt reports lower back pain x1 month. Pt denies any known injury or gi/gu symptoms. nad noted.

## 2015-07-09 ENCOUNTER — Ambulatory Visit (HOSPITAL_COMMUNITY): Payer: Medicaid Other | Admitting: Anesthesiology

## 2015-07-09 ENCOUNTER — Ambulatory Visit (HOSPITAL_COMMUNITY)
Admission: RE | Admit: 2015-07-09 | Discharge: 2015-07-09 | Disposition: A | Discharge status: Home / Self Care | Payer: Medicaid Other | Source: Ambulatory Visit | Attending: Vascular Surgery | Admitting: Vascular Surgery

## 2015-07-09 ENCOUNTER — Encounter (HOSPITAL_COMMUNITY)
Admission: RE | Disposition: A | Discharge status: Home / Self Care | Payer: Self-pay | Source: Ambulatory Visit | Attending: Vascular Surgery

## 2015-07-09 ENCOUNTER — Encounter (HOSPITAL_COMMUNITY): Payer: Self-pay

## 2015-07-09 DIAGNOSIS — B182 Chronic viral hepatitis C: Secondary | ICD-10-CM | POA: Insufficient documentation

## 2015-07-09 DIAGNOSIS — N185 Chronic kidney disease, stage 5: Secondary | ICD-10-CM | POA: Diagnosis not present

## 2015-07-09 DIAGNOSIS — N186 End stage renal disease: Secondary | ICD-10-CM | POA: Diagnosis not present

## 2015-07-09 DIAGNOSIS — I120 Hypertensive chronic kidney disease with stage 5 chronic kidney disease or end stage renal disease: Secondary | ICD-10-CM | POA: Diagnosis not present

## 2015-07-09 DIAGNOSIS — Z880 Allergy status to penicillin: Secondary | ICD-10-CM | POA: Diagnosis not present

## 2015-07-09 DIAGNOSIS — D696 Thrombocytopenia, unspecified: Secondary | ICD-10-CM | POA: Insufficient documentation

## 2015-07-09 DIAGNOSIS — F1110 Opioid abuse, uncomplicated: Secondary | ICD-10-CM | POA: Insufficient documentation

## 2015-07-09 DIAGNOSIS — F17210 Nicotine dependence, cigarettes, uncomplicated: Secondary | ICD-10-CM | POA: Diagnosis not present

## 2015-07-09 DIAGNOSIS — K219 Gastro-esophageal reflux disease without esophagitis: Secondary | ICD-10-CM | POA: Diagnosis not present

## 2015-07-09 HISTORY — DX: Inflammatory liver disease, unspecified: K75.9

## 2015-07-09 HISTORY — PX: BASCILIC VEIN TRANSPOSITION: SHX5742

## 2015-07-09 LAB — POCT I-STAT 4, (NA,K, GLUC, HGB,HCT)
Glucose, Bld: 92 mg/dL (ref 65–99)
HCT: 44 % (ref 39.0–52.0)
HEMOGLOBIN: 15 g/dL (ref 13.0–17.0)
Potassium: 4.3 mmol/L (ref 3.5–5.1)
SODIUM: 135 mmol/L (ref 135–145)

## 2015-07-09 SURGERY — TRANSPOSITION, VEIN, BASILIC
Anesthesia: General | Site: Arm Upper | Laterality: Left

## 2015-07-09 MED ORDER — HEPARIN SODIUM (PORCINE) 1000 UNIT/ML IJ SOLN
INTRAMUSCULAR | Status: DC | PRN
  Administered 2015-07-09: 5000 [IU] via INTRAVENOUS

## 2015-07-09 MED ORDER — MIDAZOLAM HCL 2 MG/2ML IJ SOLN: 0.5000 mg | Freq: Once | INTRAMUSCULAR | Status: DC | PRN

## 2015-07-09 MED ORDER — GLYCOPYRROLATE 0.2 MG/ML IJ SOLN
INTRAMUSCULAR | Status: AC
  Filled 2015-07-09: qty 2

## 2015-07-09 MED ORDER — MEPERIDINE HCL 25 MG/ML IJ SOLN: 6.2500 mg | INTRAMUSCULAR | Status: DC | PRN

## 2015-07-09 MED ORDER — FENTANYL CITRATE (PF) 100 MCG/2ML IJ SOLN
25.0000 ug | INTRAMUSCULAR | Status: DC | PRN
  Administered 2015-07-09: 25 ug via INTRAVENOUS

## 2015-07-09 MED ORDER — FENTANYL CITRATE (PF) 250 MCG/5ML IJ SOLN
INTRAMUSCULAR | Status: AC
  Filled 2015-07-09: qty 5

## 2015-07-09 MED ORDER — CHLORHEXIDINE GLUCONATE CLOTH 2 % EX PADS: 6.0000 | MEDICATED_PAD | Freq: Once | CUTANEOUS | Status: DC

## 2015-07-09 MED ORDER — PROPOFOL 10 MG/ML IV BOLUS
INTRAVENOUS | Status: AC
  Filled 2015-07-09: qty 20

## 2015-07-09 MED ORDER — MIDAZOLAM HCL 2 MG/2ML IJ SOLN
INTRAMUSCULAR | Status: AC
  Filled 2015-07-09: qty 2

## 2015-07-09 MED ORDER — PHENYLEPHRINE HCL 10 MG/ML IJ SOLN
INTRAMUSCULAR | Status: DC | PRN
  Administered 2015-07-09 (×2): 80 ug via INTRAVENOUS
  Administered 2015-07-09: 20 ug via INTRAVENOUS
  Administered 2015-07-09: 40 ug via INTRAVENOUS
  Administered 2015-07-09: 20 ug via INTRAVENOUS
  Administered 2015-07-09: 40 ug via INTRAVENOUS
  Administered 2015-07-09: 80 ug via INTRAVENOUS

## 2015-07-09 MED ORDER — FENTANYL CITRATE (PF) 100 MCG/2ML IJ SOLN
INTRAMUSCULAR | Status: DC
Start: 2015-07-09 — End: 2015-07-09
  Filled 2015-07-09: qty 2

## 2015-07-09 MED ORDER — PHENYLEPHRINE 40 MCG/ML (10ML) SYRINGE FOR IV PUSH (FOR BLOOD PRESSURE SUPPORT)
PREFILLED_SYRINGE | INTRAVENOUS | Status: AC
  Filled 2015-07-09: qty 20

## 2015-07-09 MED ORDER — FENTANYL CITRATE (PF) 100 MCG/2ML IJ SOLN
INTRAMUSCULAR | Status: DC | PRN
  Administered 2015-07-09 (×2): 50 ug via INTRAVENOUS

## 2015-07-09 MED ORDER — HEPARIN SODIUM (PORCINE) 1000 UNIT/ML IJ SOLN
INTRAMUSCULAR | Status: AC
  Filled 2015-07-09: qty 1

## 2015-07-09 MED ORDER — SODIUM CHLORIDE 0.9 % IV SOLN
INTRAVENOUS | Status: DC | PRN
  Administered 2015-07-09: 500 mL

## 2015-07-09 MED ORDER — MIDAZOLAM HCL 5 MG/5ML IJ SOLN
INTRAMUSCULAR | Status: DC | PRN
  Administered 2015-07-09: 2 mg via INTRAVENOUS

## 2015-07-09 MED ORDER — OXYCODONE-ACETAMINOPHEN 5-325 MG PO TABS: 1.0000 | ORAL_TABLET | Freq: Four times a day (QID) | ORAL | Status: DC | PRN

## 2015-07-09 MED ORDER — SODIUM CHLORIDE 0.9 % IV SOLN
INTRAVENOUS | Status: DC
  Administered 2015-07-09: 11:00:00 via INTRAVENOUS

## 2015-07-09 MED ORDER — NEOSTIGMINE METHYLSULFATE 10 MG/10ML IV SOLN
INTRAVENOUS | Status: AC
  Filled 2015-07-09: qty 1

## 2015-07-09 MED ORDER — SODIUM CHLORIDE 0.9 % IJ SOLN
INTRAMUSCULAR | Status: AC
Start: 2015-07-09 — End: 2015-07-09
  Filled 2015-07-09: qty 10

## 2015-07-09 MED ORDER — THROMBIN 20000 UNITS EX SOLR
CUTANEOUS | Status: AC
  Filled 2015-07-09: qty 20000

## 2015-07-09 MED ORDER — PROPOFOL 10 MG/ML IV BOLUS
INTRAVENOUS | Status: DC | PRN
  Administered 2015-07-09: 350 mg via INTRAVENOUS

## 2015-07-09 MED ORDER — ROCURONIUM BROMIDE 50 MG/5ML IV SOLN
INTRAVENOUS | Status: AC
  Filled 2015-07-09: qty 2

## 2015-07-09 MED ORDER — PROMETHAZINE HCL 25 MG/ML IJ SOLN: 6.2500 mg | INTRAMUSCULAR | Status: DC | PRN

## 2015-07-09 MED ORDER — ONDANSETRON HCL 4 MG/2ML IJ SOLN
INTRAMUSCULAR | Status: DC | PRN
  Administered 2015-07-09: 4 mg via INTRAVENOUS

## 2015-07-09 MED ORDER — 0.9 % SODIUM CHLORIDE (POUR BTL) OPTIME
TOPICAL | Status: DC | PRN
  Administered 2015-07-09: 1000 mL

## 2015-07-09 MED ORDER — LIDOCAINE HCL (CARDIAC) 20 MG/ML IV SOLN
INTRAVENOUS | Status: DC | PRN
  Administered 2015-07-09: 20 mg via INTRAVENOUS

## 2015-07-09 MED ORDER — LABETALOL HCL 5 MG/ML IV SOLN
INTRAVENOUS | Status: AC
  Filled 2015-07-09: qty 8

## 2015-07-09 SURGICAL SUPPLY — 32 items
CANISTER SUCTION 2500CC (MISCELLANEOUS) ×2 IMPLANT
CANNULA VESSEL 3MM 2 BLNT TIP (CANNULA) ×2 IMPLANT
CLIP TI MEDIUM 24 (CLIP) ×2 IMPLANT
CLIP TI WIDE RED SMALL 24 (CLIP) ×2 IMPLANT
COVER PROBE W GEL 5X96 (DRAPES) ×2 IMPLANT
DECANTER SPIKE VIAL GLASS SM (MISCELLANEOUS) IMPLANT
DRAIN PENROSE 1/4X12 LTX STRL (WOUND CARE) ×2 IMPLANT
ELECT REM PT RETURN 9FT ADLT (ELECTROSURGICAL) ×2
ELECTRODE REM PT RTRN 9FT ADLT (ELECTROSURGICAL) ×1 IMPLANT
GEL ULTRASOUND 20GR AQUASONIC (MISCELLANEOUS) IMPLANT
GLOVE BIO SURGEON STRL SZ7.5 (GLOVE) ×2 IMPLANT
GLOVE BIOGEL PI IND STRL 6.5 (GLOVE) ×1 IMPLANT
GLOVE BIOGEL PI INDICATOR 6.5 (GLOVE) ×1
GLOVE SS BIOGEL STRL SZ 6.5 (GLOVE) ×1 IMPLANT
GLOVE SUPERSENSE BIOGEL SZ 6.5 (GLOVE) ×1
GOWN STRL REUS W/ TWL LRG LVL3 (GOWN DISPOSABLE) ×2 IMPLANT
GOWN STRL REUS W/TWL LRG LVL3 (GOWN DISPOSABLE) ×2
KIT BASIN OR (CUSTOM PROCEDURE TRAY) ×2 IMPLANT
KIT ROOM TURNOVER OR (KITS) ×2 IMPLANT
LIQUID BAND (GAUZE/BANDAGES/DRESSINGS) ×2 IMPLANT
LOOP VESSEL MINI RED (MISCELLANEOUS) IMPLANT
NS IRRIG 1000ML POUR BTL (IV SOLUTION) ×2 IMPLANT
PACK CV ACCESS (CUSTOM PROCEDURE TRAY) ×2 IMPLANT
PAD ARMBOARD 7.5X6 YLW CONV (MISCELLANEOUS) ×4 IMPLANT
SPONGE SURGIFOAM ABS GEL 100 (HEMOSTASIS) IMPLANT
SUT PROLENE 7 0 BV 1 (SUTURE) ×2 IMPLANT
SUT SILK 2 0 SH (SUTURE) ×2 IMPLANT
SUT VIC AB 3-0 SH 27 (SUTURE) ×1
SUT VIC AB 3-0 SH 27X BRD (SUTURE) ×1 IMPLANT
SUT VICRYL 4-0 PS2 18IN ABS (SUTURE) ×2 IMPLANT
UNDERPAD 30X30 INCONTINENT (UNDERPADS AND DIAPERS) ×2 IMPLANT
WATER STERILE IRR 1000ML POUR (IV SOLUTION) ×2 IMPLANT

## 2015-07-09 NOTE — Transfer of Care (Signed)
Immediate Anesthesia Transfer of Care Note  Patient: Terrance Reynolds  Procedure(s) Performed: Procedure(s): LEFT BRACHIOCEPHALIC ARTERIOVENOUS FISTULA (Left)  Patient Location: PACU  Anesthesia Type:General  Level of Consciousness: sedated  Airway & Oxygen Therapy: Patient Spontanous Breathing  Post-op Assessment: Report given to RN, Post -op Vital signs reviewed and stable and Patient moving all extremities  Post vital signs: Reviewed and stable  Last Vitals:  Filed Vitals:   07/09/15 1037  BP: 146/90  Pulse: 89  Temp: 36.9 C  Resp: 18    Complications: No apparent anesthesia complications

## 2015-07-09 NOTE — Anesthesia Procedure Notes (Signed)
Procedure Name: LMA Insertion Date/Time: 07/09/2015 11:15 AM Performed by: Suzy Bouchard Pre-anesthesia Checklist: Patient identified, Timeout performed, Emergency Drugs available, Suction available and Patient being monitored Patient Re-evaluated:Patient Re-evaluated prior to inductionOxygen Delivery Method: Circle system utilized Preoxygenation: Pre-oxygenation with 100% oxygen Intubation Type: IV induction LMA: LMA inserted LMA Size: 5.0 Number of attempts: 1 Placement Confirmation: ETT inserted through vocal cords under direct vision,  positive ETCO2 and breath sounds checked- equal and bilateral Tube secured with: Tape Dental Injury: Teeth and Oropharynx as per pre-operative assessment

## 2015-07-09 NOTE — Anesthesia Preprocedure Evaluation (Addendum)
Anesthesia Evaluation  Patient identified by MRN, date of birth, ID band Patient awake    Reviewed: Allergy & Precautions, NPO status , Patient's Chart, lab work & pertinent test results  History of Anesthesia Complications Negative for: history of anesthetic complications  Airway Mallampati: II  TM Distance: >3 FB Neck ROM: Full    Dental  (+) Chipped, Dental Advisory Given   Pulmonary Current Smoker,    breath sounds clear to auscultation       Cardiovascular hypertension, Pt. on medications (-) angina Rhythm:Regular Rate:Normal  6/16 ECHO: EF 55%, mild MR   Neuro/Psych negative neurological ROS     GI/Hepatic GERD  Controlled,(+)     substance abuse (methadone clinic)  , Hepatitis -, C  Endo/Other  negative endocrine ROS  Renal/GU ESRF and DialysisRenal disease (TuThSa, K+ 4.3)     Musculoskeletal   Abdominal   Peds  Hematology  (+) Blood dyscrasia (thrombocytopenia), ,   Anesthesia Other Findings   Reproductive/Obstetrics                          Anesthesia Physical Anesthesia Plan  ASA: III  Anesthesia Plan: General   Post-op Pain Management:    Induction: Intravenous  Airway Management Planned: LMA  Additional Equipment:   Intra-op Plan:   Post-operative Plan:   Informed Consent: I have reviewed the patients History and Physical, chart, labs and discussed the procedure including the risks, benefits and alternatives for the proposed anesthesia with the patient or authorized representative who has indicated his/her understanding and acceptance.   Dental advisory given  Plan Discussed with: CRNA and Surgeon  Anesthesia Plan Comments: (Plan routine monitors, GA- LMA OK)        Anesthesia Quick Evaluation

## 2015-07-09 NOTE — Interval H&P Note (Signed)
History and Physical Interval Note:  07/09/2015 11:01 AM  Terrance Reynolds  has presented today for surgery, with the diagnosis of Chronic Kidney disease on hemodialysis  The various methods of treatment have been discussed with the patient and family. After consideration of risks, benefits and other options for treatment, the patient has consented to  Procedure(s): BRACHIOCEPHALIC ARTERIOVENOUS FISTULA vs BASILIC VEIN TRANSPOSITION (Left) as a surgical intervention .  The patient's history has been reviewed, patient examined, no change in status, stable for surgery.  I have reviewed the patient's chart and labs.  Questions were answered to the patient's satisfaction.     Ruta Hinds

## 2015-07-09 NOTE — H&P (View-Only) (Signed)
    Postoperative Access Visit   History of Present Illness  Terrance Reynolds is a 34 y.o. year old male patient who is s/p left radial cephalic AV fistula creation on 06/18/15. He returns today with C/O left post-op site absence of thrill for 1-2 weks according to nurse at [facility]. He is dialyzing via a right side chest temporary catheter on Tuesdays Thursdays and Saturdays.   He is right-handed. He denies fever or chills, denies steal symptoms in left hand.  He smokes a half ppd, started smoking at age 30. He does not have DM, but states he had undiagnosed hypertension, then renal failure. 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   For VQI Use Only  PRE-ADM LIVING: Home  AMB STATUS: Ambulatory  Physical Examination Filed Vitals:   07/03/15 1040  BP: 133/75  Pulse: 80  Temp: 97.9 F (36.6 C)  TempSrc: Oral  Resp: 16  Height: 6\' 2"  (1.88 m)  Weight: 184 lb (83.462 kg)  SpO2: 98%   Body mass index is 23.61 kg/(m^2).  Left forearm: Incision is well healed, skin feels normal warmth, hand grip is 5/5, sensation in digits is intact, no palpable thrill, bruit can not be auscultated. Left radial pulse is 2+ palpable.   Medical Decision Making  Terrance Reynolds is a 34 y.o. year old male who presents s/p radial cephalic AV fistula creation on 06/18/15. Dr. Oneida Alar spoke with and examined pt. His eft forearm AV fistula has no palpable thrill and no audible bruit, no signs of infection.  Will schedule for left brachiocephalic AVF creation vs basilic vein transposition on 07/09/15 by Dr. Oneida Alar.   Jeania Nater, Sharmon Leyden, RN, MSN, FNP-C [facility] Office: [phone]  07/03/2015, 11:01 AM  Clinic MD: Oneida Alar

## 2015-07-09 NOTE — Op Note (Signed)
Procedure: Left Brachial Cephalic AV fistula  Preop: ESRD  Postop: ESRD  Anesthesia: General  Assistant:Nurse  Findings: 3 mm cephalic vein  Procedure: After obtaining informed consent, the patient was taken to the operating room.  After induction of general anesthesia, the left upper extremity was prepped and draped in usual sterile fashion.  A transverse incision was then made near the antecubital crease the left arm. The incision was carried into the subcutaneous tissues down to level of the cephalic vein. The cephalic vein was approximately 3 mm in diameter. It was of good quality. This was dissected free circumferentially and small side branches ligated and divided between silk ties or clips. Next the brachial artery was dissected free in the medial portion of the incision. The artery was  3.5 mm in diameter. The vessel loops were placed proximal and distal to the planned site of arteriotomy. The patient was given 5000 units of intravenous heparin. After appropriate circulation time, the vessel loops were used to control the artery. A longitudinal opening was made in the brachial artery.  The vein was ligated distally with a 2-0 silk tie. The vein was controlled proximally with a fine bulldog clamp. The vein was then swung over to the artery and sewn end of vein to side of artery using a running 7-0 Prolene suture. Just prior to completion of the anastomosis, everything was fore bled back bled and thoroughly flushed. The anastomosis was secured, vessel loops released, and there was a palpable thrill in the fistula immediately. After hemostasis was obtained, the subcutaneous tissues were reapproximated using a running 3-0 Vicryl suture. The skin was then closed with a 4 0 Vicryl subcuticular stitch. Dermabond was applied to the skin incision.    Ruta Hinds, MD [facility] Office: [phone] Pager: [phone]

## 2015-07-09 NOTE — Anesthesia Postprocedure Evaluation (Signed)
  Anesthesia Post-op Note  Patient: Terrance Reynolds  Procedure(s) Performed: Procedure(s): LEFT BRACHIOCEPHALIC ARTERIOVENOUS FISTULA (Left)  Patient Location: PACU  Anesthesia Type:General  Level of Consciousness: awake, alert , oriented and patient cooperative  Airway and Oxygen Therapy: Patient Spontanous Breathing  Post-op Pain: none  Post-op Assessment: Post-op Vital signs reviewed, Patient's Cardiovascular Status Stable, Respiratory Function Stable, Patent Airway, No signs of Nausea Terrance vomiting and Pain level controlled              Post-op Vital Signs: Reviewed and stable  Last Vitals:  Filed Vitals:   07/09/15 1303  BP: 126/73  Pulse: 71  Temp:   Resp: 7    Complications: No apparent anesthesia complications

## 2015-07-10 ENCOUNTER — Encounter (HOSPITAL_COMMUNITY): Payer: Self-pay | Admitting: Vascular Surgery

## 2015-07-10 NOTE — ED Provider Notes (Signed)
CSN: [id number]     Arrival date & time 07/08/15  [id number] History   First MD Initiated Contact with Patient 07/08/15 1032     Chief Complaint  Patient presents with  . Back Pain     (Consider location/radiation/quality/duration/timing/severity/associated sxs/prior Treatment) HPI   Terrance Reynolds is a 34 y.o. male who presents to the Emergency Department complaining of right sided low back pain for approximately one month.  He describes a sharp, "catching" type pain to his back that is worse with movement and improves at rest.  He states that he thought he pulled a muscle in his back, but states the pain hasn't improved.   He reports occasional pain into his right buttocks, but denies radiating pain into his leg.  He hasn't tried any therapies.  States he currently takes methadone.  He denies fever, abdominal pain, numbness or weakness of the lower extremities, and urine or bowel incontinence or retention.    Past Medical History  Diagnosis Date  . Psoriasis   . Hypertension   . Substance abuse     methadone clinic  . Anemia   . Thrombocytopenia   . Methadone maintenance therapy patient   . Diastolic dysfunction 0000000.    Grade 2 per echo  . Acute on chronic renal failure     ESRD TThSAT  . CKD (chronic kidney disease)   . Headache     Migraines- due to headache  . Shortness of breath dyspnea     when I had a lot of fluid on  . GERD (gastroesophageal reflux disease)   . Constipation   . Hepatitis     hepatitis   Past Surgical History  Procedure Laterality Date  . Av fistula placement Left 06/18/2015    Procedure: LEFT RADIOCEPHALIC ARTERIOVENOUS (AV) FISTULA CREATION;  Surgeon: Elam Dutch, MD;  Location: [facility];  Service: Vascular;  Laterality: Left;  . Bascilic vein transposition Left 07/09/2015    Procedure: LEFT BRACHIOCEPHALIC ARTERIOVENOUS FISTULA;  Surgeon: Elam Dutch, MD;  Location: [facility] OR;  Service: Vascular;  Laterality: Left;   Family History   Problem Relation Age of Onset  . Diabetes Mother   . Heart disease Father   . Hypertension Father    Social History  Substance Use Topics  . Smoking status: Current Every Day Smoker -- 0.25 packs/day for 10 years    Types: Cigarettes  . Smokeless tobacco: Never Used     Comment: 10 years  off and on  . Alcohol Use: No    Review of Systems  Constitutional: Negative for fever.  Respiratory: Negative for shortness of breath.   Gastrointestinal: Negative for vomiting, abdominal pain and constipation.  Genitourinary: Negative for dysuria, hematuria, flank pain, decreased urine volume and difficulty urinating.  Musculoskeletal: Positive for back pain. Negative for joint swelling.  Skin: Negative for rash.  Neurological: Negative for weakness and numbness.  All other systems reviewed and are negative.     Allergies  Penicillins  Home Medications   Prior to Admission medications   Medication Sig Start Date End Date Taking? Authorizing Provider  aspirin 325 MG tablet Take 325-650 mg by mouth daily as needed for headache.   Yes Historical Provider, MD  furosemide (LASIX) 80 MG tablet Take 80 mg by mouth daily.   Yes Historical Provider, MD  labetalol (NORMODYNE) 300 MG tablet Take 1 tablet (300 mg total) by mouth 2 (two) times daily. 04/17/15  Yes Radene Gunning, NP  methadone (DOLOPHINE) 10  MG tablet Take 75 mg by mouth daily.   Yes Historical Provider, MD  Multiple Vitamin (MULTIVITAMIN WITH MINERALS) TABS tablet Take 1 tablet by mouth daily.   Yes Historical Provider, MD  promethazine (PHENERGAN) 25 MG tablet Take 1 tablet (25 mg total) by mouth every 6 (six) hours as needed for nausea or vomiting. 04/17/15  Yes Radene Gunning, NP  sevelamer carbonate (RENVELA) 800 MG tablet Take 800-1,600 mg by mouth 3 (three) times daily with meals. Take 2 tablets with meals, and 1 tablet with snacks   Yes Historical Provider, MD  hydrALAZINE (APRESOLINE) 50 MG tablet Take 1 tablet (50 mg total) by  mouth every 8 (eight) hours. 04/17/15   Radene Gunning, NP  oxyCODONE-acetaminophen (PERCOCET/ROXICET) 5-325 MG per tablet Take 1 tablet by mouth every 6 (six) hours as needed for moderate pain. 07/09/15   Elam Dutch, MD  oxyCODONE-acetaminophen (ROXICET) 5-325 MG per tablet Take 1 tablet by mouth every 6 (six) hours as needed for severe pain. 06/18/15   Joelene Millin A Trinh, PA-C   BP 158/93 mmHg  Pulse 70  Temp(Src) 98.1 F (36.7 C) (Oral)  Resp 14  Ht 6\' 2"  (1.88 m)  Wt 190 lb (86.183 kg)  BMI 24.38 kg/m2  SpO2 100% Physical Exam  Constitutional: He is oriented to person, place, and time. He appears well-developed and well-nourished. No distress.  HENT:  Head: Normocephalic and atraumatic.  Neck: Normal range of motion. Neck supple.  Cardiovascular: Normal rate, regular rhythm, normal heart sounds and intact distal pulses.   No murmur heard. Pulmonary/Chest: Effort normal and breath sounds normal. No respiratory distress.  Abdominal: Soft. He exhibits no distension. There is no tenderness. There is no rebound and no guarding.  Musculoskeletal: He exhibits tenderness. He exhibits no edema.       Lumbar back: He exhibits tenderness and pain. He exhibits normal range of motion, no swelling, no deformity, no laceration and normal pulse.  ttp of the right lumbar paraspinal muscles.  No spinal tenderness.  DP pulses are brisk and symmetrical.  Distal sensation intact.  Hip Flexors/Extensors are intact.  Pt has 5/5 strength against resistance of bilateral lower extremities.     Neurological: He is alert and oriented to person, place, and time. He has normal strength. No sensory deficit. He exhibits normal muscle tone. Coordination and gait normal.  Reflex Scores:      Patellar reflexes are 2+ on the right side and 2+ on the left side.      Achilles reflexes are 2+ on the right side and 2+ on the left side. Skin: Skin is warm and dry. No rash noted.  Nursing note and vitals reviewed.   ED  Course  Procedures (including critical care time) Labs Review Labs Reviewed  URINALYSIS, ROUTINE W REFLEX MICROSCOPIC (NOT AT [facility]) - Abnormal; Notable for the following:    Protein, ur 100 (*)    All other components within normal limits  URINE MICROSCOPIC-ADD ON    Imaging Review No results found. I have personally reviewed and evaluated these images and lab results as part of my medical decision-making.   EKG Interpretation None      MDM   Final diagnoses:  Right-sided low back pain without sciatica   Pt is ambulatory with a steady gait.  No focal neuro deficits.  No concerning sx's for emergent neurological or infectious process.  No indication for imaging.  Sx's likely musculoskeletal.  Pt agrees to arrange PMD f/u.  Kem Parkinson, PA-C 07/10/15 2151  Milton Ferguson, MD 07/11/15 [phone]

## 2015-07-11 ENCOUNTER — Telehealth: Payer: Self-pay | Admitting: Vascular Surgery

## 2015-07-11 NOTE — Telephone Encounter (Signed)
LM for pt re appt, dpm °

## 2015-07-11 NOTE — Telephone Encounter (Signed)
-----   Message from Mena Goes, RN sent at 07/09/2015  5:11 PM EDT ----- Regarding: Schedule   ----- Message -----    From: Elam Dutch, MD    Sent: 07/09/2015  12:25 PM      To: [facility] Charge Pool  Left brachial cephalic AVF Follow up 2 weeks  Ruta Hinds

## 2015-07-29 ENCOUNTER — Encounter: Payer: Self-pay | Admitting: Vascular Surgery

## 2015-07-31 ENCOUNTER — Ambulatory Visit: Payer: Medicaid Other | Admitting: Vascular Surgery

## 2015-07-31 ENCOUNTER — Encounter (HOSPITAL_COMMUNITY): Payer: Medicaid Other

## 2015-08-07 ENCOUNTER — Encounter: Payer: Medicaid Other | Admitting: Vascular Surgery

## 2015-09-01 ENCOUNTER — Encounter
Admission: RE | Admit: 2015-09-01 | Discharge: 2015-09-01 | Disposition: A | Discharge status: Home / Self Care | Payer: Medicaid Other | Source: Ambulatory Visit | Attending: Vascular Surgery | Admitting: Vascular Surgery

## 2015-09-01 DIAGNOSIS — Z01812 Encounter for preprocedural laboratory examination: Secondary | ICD-10-CM | POA: Diagnosis not present

## 2015-09-01 HISTORY — DX: Dependence on renal dialysis: Z99.2

## 2015-09-01 HISTORY — DX: Migraine, unspecified, not intractable, without status migrainosus: G43.909

## 2015-09-01 LAB — BASIC METABOLIC PANEL
Anion gap: 12 (ref 5–15)
BUN: 75 mg/dL — AB (ref 6–20)
CO2: 26 mmol/L (ref 22–32)
CREATININE: 6.14 mg/dL — AB (ref 0.61–1.24)
Calcium: 8.6 mg/dL — ABNORMAL LOW (ref 8.9–10.3)
Chloride: 98 mmol/L — ABNORMAL LOW (ref 101–111)
GFR calc Af Amer: 12 mL/min — ABNORMAL LOW (ref >60)
GFR calc non Af Amer: 11 mL/min — ABNORMAL LOW (ref >60)
GLUCOSE: 116 mg/dL — AB (ref 65–99)
Potassium: 4.2 mmol/L (ref 3.5–5.1)
Sodium: 136 mmol/L (ref 135–145)

## 2015-09-01 LAB — SURGICAL PCR SCREEN
MRSA, PCR: NEGATIVE
Staphylococcus aureus: NEGATIVE

## 2015-09-01 NOTE — Patient Instructions (Signed)
  Your procedure is scheduled on: 09/03/15 Wed Report to Day Surgery. To find out your arrival time please call [phone] between 1PM - 3PM on 09/02/15 Tues.  Remember: Instructions that are not followed completely may result in serious medical risk, up to and including death, or upon the discretion of your surgeon and anesthesiologist your surgery may need to be rescheduled.    __x__ 1. Do not eat food or drink liquids after midnight. No gum chewing or hard candies.     ____ 2. No Alcohol for 24 hours before or after surgery.   ____ 3. Bring all medications with you on the day of surgery if instructed.    __x__ 4. Notify your doctor if there is any change in your medical condition     (cold, fever, infections).     Do not wear jewelry, make-up, hairpins, clips or nail polish.  Do not wear lotions, powders, or perfumes. You may wear deodorant.  Do not shave 48 hours prior to surgery. Men may shave face and neck.  Do not bring valuables to the hospital.    [facility] is not responsible for any belongings or valuables.               Contacts, dentures or bridgework may not be worn into surgery.  Leave your suitcase in the car. After surgery it may be brought to your room.  For patients admitted to the hospital, discharge time is determined by your                treatment team.   Patients discharged the day of surgery will not be allowed to drive home.   Please read over the following fact sheets that you were given:   MRSA Information   __x__ Take these medicines the morning of surgery with A SIP OF WATER:    1. amLODipine (NORVASC) 5 MG tablet  2. hydrALAZINE (APRESOLINE) 50 MG tablet  3. labetalol (NORMODYNE) 300 MG tablet  4.  5.  6.  ____ Fleet Enema (as directed)   __x__ Use CHG Soap as directed  ____ Use inhalers on the day of surgery  ____ Stop metformin 2 days prior to surgery    ____ Take 1/2 of usual insulin dose the night before surgery and none on the  morning of surgery.   ____ Stop Coumadin/Plavix/aspirin on   __x__ Stop Anti-inflammatories on Take Tylenol if needed   ____ Stop supplements until after surgery.    ____ Bring C-Pap to the hospital.

## 2015-09-03 ENCOUNTER — Encounter
Admission: RE | Disposition: A | Discharge status: Home / Self Care | Payer: Self-pay | Source: Ambulatory Visit | Attending: Vascular Surgery

## 2015-09-03 ENCOUNTER — Ambulatory Visit
Admission: RE | Admit: 2015-09-03 | Discharge: 2015-09-03 | Disposition: A | Discharge status: Home / Self Care | Payer: Medicaid Other | Source: Ambulatory Visit | Attending: Vascular Surgery | Admitting: Vascular Surgery

## 2015-09-03 ENCOUNTER — Encounter: Payer: Self-pay | Admitting: *Deleted

## 2015-09-03 ENCOUNTER — Ambulatory Visit: Payer: Medicaid Other | Admitting: Anesthesiology

## 2015-09-03 DIAGNOSIS — Z823 Family history of stroke: Secondary | ICD-10-CM | POA: Diagnosis not present

## 2015-09-03 DIAGNOSIS — K219 Gastro-esophageal reflux disease without esophagitis: Secondary | ICD-10-CM | POA: Insufficient documentation

## 2015-09-03 DIAGNOSIS — Z992 Dependence on renal dialysis: Secondary | ICD-10-CM | POA: Insufficient documentation

## 2015-09-03 DIAGNOSIS — Z79899 Other long term (current) drug therapy: Secondary | ICD-10-CM | POA: Diagnosis not present

## 2015-09-03 DIAGNOSIS — D649 Anemia, unspecified: Secondary | ICD-10-CM | POA: Insufficient documentation

## 2015-09-03 DIAGNOSIS — Z8249 Family history of ischemic heart disease and other diseases of the circulatory system: Secondary | ICD-10-CM | POA: Insufficient documentation

## 2015-09-03 DIAGNOSIS — Z833 Family history of diabetes mellitus: Secondary | ICD-10-CM | POA: Insufficient documentation

## 2015-09-03 DIAGNOSIS — N186 End stage renal disease: Secondary | ICD-10-CM | POA: Diagnosis present

## 2015-09-03 DIAGNOSIS — F17200 Nicotine dependence, unspecified, uncomplicated: Secondary | ICD-10-CM | POA: Diagnosis not present

## 2015-09-03 DIAGNOSIS — I120 Hypertensive chronic kidney disease with stage 5 chronic kidney disease or end stage renal disease: Secondary | ICD-10-CM | POA: Diagnosis not present

## 2015-09-03 HISTORY — PX: INSERTION OF DIALYSIS CATHETER: SHX1324

## 2015-09-03 LAB — POCT I-STAT 4, (NA,K, GLUC, HGB,HCT)
GLUCOSE: 92 mg/dL (ref 65–99)
HEMATOCRIT: 41 % (ref 39.0–52.0)
Hemoglobin: 13.9 g/dL (ref 13.0–17.0)
POTASSIUM: 4.9 mmol/L (ref 3.5–5.1)
Sodium: 134 mmol/L — ABNORMAL LOW (ref 135–145)

## 2015-09-03 SURGERY — INSERTION OF DIALYSIS CATHETER
Anesthesia: General | Wound class: Clean Contaminated

## 2015-09-03 MED ORDER — ROCURONIUM BROMIDE 100 MG/10ML IV SOLN
INTRAVENOUS | Status: DC | PRN
  Administered 2015-09-03: 20 mg via INTRAVENOUS
  Administered 2015-09-03: 30 mg via INTRAVENOUS

## 2015-09-03 MED ORDER — KETOROLAC TROMETHAMINE 30 MG/ML IJ SOLN
30.0000 mg | Freq: Once | INTRAMUSCULAR | Status: AC
  Administered 2015-09-03: 30 mg via INTRAVENOUS

## 2015-09-03 MED ORDER — CEFAZOLIN SODIUM 1-5 GM-% IV SOLN
INTRAVENOUS | Status: AC
  Filled 2015-09-03: qty 50

## 2015-09-03 MED ORDER — KETOROLAC TROMETHAMINE 30 MG/ML IJ SOLN
INTRAMUSCULAR | Status: AC
  Filled 2015-09-03: qty 1

## 2015-09-03 MED ORDER — SUGAMMADEX SODIUM 500 MG/5ML IV SOLN
INTRAVENOUS | Status: DC | PRN
  Administered 2015-09-03: 177 mg via INTRAVENOUS

## 2015-09-03 MED ORDER — PROPOFOL 10 MG/ML IV BOLUS
INTRAVENOUS | Status: DC | PRN
  Administered 2015-09-03: 180 mg via INTRAVENOUS

## 2015-09-03 MED ORDER — BUPIVACAINE-EPINEPHRINE (PF) 0.25% -1:200000 IJ SOLN
INTRAMUSCULAR | Status: AC
  Filled 2015-09-03: qty 30

## 2015-09-03 MED ORDER — OXYCODONE-ACETAMINOPHEN 5-325 MG PO TABS
ORAL_TABLET | ORAL | Status: AC
  Filled 2015-09-03: qty 1

## 2015-09-03 MED ORDER — VANCOMYCIN HCL IN DEXTROSE 1-5 GM/200ML-% IV SOLN
1000.0000 mg | Freq: Once | INTRAVENOUS | Status: AC
  Administered 2015-09-03: 1000 mg via INTRAVENOUS

## 2015-09-03 MED ORDER — CEFAZOLIN SODIUM 1-5 GM-% IV SOLN: 1.0000 g | Freq: Once | INTRAVENOUS | Status: DC

## 2015-09-03 MED ORDER — LIDOCAINE HCL (CARDIAC) 20 MG/ML IV SOLN
INTRAVENOUS | Status: DC | PRN
  Administered 2015-09-03: 30 mg via INTRAVENOUS

## 2015-09-03 MED ORDER — DEXAMETHASONE SODIUM PHOSPHATE 4 MG/ML IJ SOLN
INTRAMUSCULAR | Status: DC | PRN
  Administered 2015-09-03: 10 mg via INTRAVENOUS

## 2015-09-03 MED ORDER — FENTANYL CITRATE (PF) 100 MCG/2ML IJ SOLN
INTRAMUSCULAR | Status: DC | PRN
  Administered 2015-09-03 (×2): 50 ug via INTRAVENOUS
  Administered 2015-09-03: 100 ug via INTRAVENOUS

## 2015-09-03 MED ORDER — FAMOTIDINE 20 MG PO TABS
20.0000 mg | ORAL_TABLET | Freq: Once | ORAL | Status: AC
  Administered 2015-09-03: 20 mg via ORAL

## 2015-09-03 MED ORDER — MIDAZOLAM HCL 2 MG/2ML IJ SOLN
INTRAMUSCULAR | Status: DC | PRN
  Administered 2015-09-03: 2 mg via INTRAVENOUS

## 2015-09-03 MED ORDER — FENTANYL CITRATE (PF) 100 MCG/2ML IJ SOLN
INTRAMUSCULAR | Status: AC
  Filled 2015-09-03: qty 2

## 2015-09-03 MED ORDER — HYDROMORPHONE HCL 1 MG/ML IJ SOLN
INTRAMUSCULAR | Status: AC
  Filled 2015-09-03: qty 1

## 2015-09-03 MED ORDER — ONDANSETRON HCL 4 MG/2ML IJ SOLN
INTRAMUSCULAR | Status: DC | PRN
  Administered 2015-09-03: 4 mg via INTRAVENOUS

## 2015-09-03 MED ORDER — SODIUM CHLORIDE 0.9 % IV SOLN
INTRAVENOUS | Status: DC
  Administered 2015-09-03 (×3): via INTRAVENOUS

## 2015-09-03 MED ORDER — FAMOTIDINE 20 MG PO TABS
ORAL_TABLET | ORAL | Status: AC
  Administered 2015-09-03: 20 mg via ORAL
  Filled 2015-09-03: qty 1

## 2015-09-03 MED ORDER — HYDROMORPHONE HCL 1 MG/ML IJ SOLN
0.5000 mg | INTRAMUSCULAR | Status: AC | PRN
  Administered 2015-09-03 (×4): 0.5 mg via INTRAVENOUS

## 2015-09-03 MED ORDER — OXYCODONE-ACETAMINOPHEN 5-325 MG PO TABS: 1.0000 | ORAL_TABLET | Freq: Four times a day (QID) | ORAL | Status: DC | PRN

## 2015-09-03 MED ORDER — FENTANYL CITRATE (PF) 100 MCG/2ML IJ SOLN
25.0000 ug | INTRAMUSCULAR | Status: AC | PRN
  Administered 2015-09-03 (×6): 25 ug via INTRAVENOUS

## 2015-09-03 MED ORDER — OXYCODONE-ACETAMINOPHEN 5-325 MG PO TABS
1.0000 | ORAL_TABLET | Freq: Four times a day (QID) | ORAL | Status: DC | PRN
  Administered 2015-09-03: 1 via ORAL

## 2015-09-03 MED ORDER — VANCOMYCIN HCL IN DEXTROSE 1-5 GM/200ML-% IV SOLN
INTRAVENOUS | Status: AC
Start: 2015-09-03 — End: 2015-09-04
  Filled 2015-09-03: qty 200

## 2015-09-03 MED ORDER — ONDANSETRON HCL 4 MG/2ML IJ SOLN: 4.0000 mg | Freq: Once | INTRAMUSCULAR | Status: DC | PRN

## 2015-09-03 SURGICAL SUPPLY — 37 items
ADAPTER CATH DIALYSIS 18.75 (CATHETERS) IMPLANT
ADAPTER CATH DIALYSIS 18.75CM (CATHETERS)
BLADE SURG 15 STRL LF DISP TIS (BLADE) ×1 IMPLANT
BLADE SURG 15 STRL SS (BLADE) ×2
CANISTER SUCT 1200ML W/VALVE (MISCELLANEOUS) ×3 IMPLANT
CATH DIALYSIS TI ADAPTER (ADAPTER) IMPLANT
CATH DLYS SWAN NECK 62.5CM (CATHETERS) IMPLANT
CHLORAPREP W/TINT 26ML (MISCELLANEOUS) ×3 IMPLANT
DIALYSIS CATH PD EXTEND (CATHETERS) ×3 IMPLANT
DIALYSIS CATH PD INTRO KIT (CATHETERS) ×3 IMPLANT
DIALYSIS CATH PD TROCAR (CATHETERS) ×3 IMPLANT
GAUZE SPONGE 4X4 12PLY STRL (GAUZE/BANDAGES/DRESSINGS) ×3 IMPLANT
GLOVE SURG SYN 8.0 (GLOVE) ×9 IMPLANT
GOWN STRL REUS W/ TWL LRG LVL3 (GOWN DISPOSABLE) ×1 IMPLANT
GOWN STRL REUS W/ TWL XL LVL3 (GOWN DISPOSABLE) ×1 IMPLANT
GOWN STRL REUS W/TWL LRG LVL3 (GOWN DISPOSABLE) ×2
GOWN STRL REUS W/TWL XL LVL3 (GOWN DISPOSABLE) ×2
KIT RM TURNOVER STRD PROC AR (KITS) ×3 IMPLANT
LABEL OR SOLS (LABEL) ×3 IMPLANT
LIQUID BAND (GAUZE/BANDAGES/DRESSINGS) ×3 IMPLANT
MINICAP W/POVIDONE IODINE SOL (MISCELLANEOUS) ×3 IMPLANT
NEEDLE HYPO 25X1 1.5 SAFETY (NEEDLE) ×3 IMPLANT
NEEDLE INSUFFLATION 150MM (ENDOMECHANICALS) ×3 IMPLANT
NS IRRIG 500ML POUR BTL (IV SOLUTION) ×3 IMPLANT
PACK LAP CHOLECYSTECTOMY (MISCELLANEOUS) ×3 IMPLANT
PAD GROUND ADULT SPLIT (MISCELLANEOUS) ×3 IMPLANT
PENCIL ELECTRO HAND CTR (MISCELLANEOUS) ×3 IMPLANT
SET TRANSFER 6 W/TWIST CLAMP 5 (SET/KITS/TRAYS/PACK) ×3 IMPLANT
SLEEVE ENDOPATH XCEL 5M (ENDOMECHANICALS) IMPLANT
SUT ETHIBOND 0 36 GRN (SUTURE) ×3 IMPLANT
SUT MNCRL AB 4-0 PS2 18 (SUTURE) ×3 IMPLANT
SUT VIC AB 3-0 SH 27 (SUTURE) ×4
SUT VIC AB 3-0 SH 27X BRD (SUTURE) ×2 IMPLANT
SUT VICRYL 0 AB UR-6 (SUTURE) ×6 IMPLANT
SYR 50ML LL SCALE MARK (SYRINGE) ×3 IMPLANT
TROCAR XCEL NON-BLD 5MMX100MML (ENDOMECHANICALS) ×3 IMPLANT
TUBING INSUFFLATOR HI FLOW (MISCELLANEOUS) ×3 IMPLANT

## 2015-09-03 NOTE — Op Note (Signed)
  OPERATIVE NOTE   PROCEDURE: 1. Laparoscopic peritoneal dialysis catheter placement.  PRE-OPERATIVE DIAGNOSIS: 1. End stage renal disease 2. Hypertension  POST-OPERATIVE DIAGNOSIS: Same  SURGEON: Anari Evitt, Dolores Lory  ASSISTANT(S): None  ANESTHESIA: general  ESTIMATED BLOOD LOSS: Minimal   FINDING(S): 1. None  SPECIMEN(S): None  INDICATIONS:  Terrance Reynolds is a 34 y.o. male who presents with end stage renal disease currently on hemodialysis via a right IJ catheter. The patient has decided to do peritoneal dialysis for his long-term dialysis. Risks and benefits of placement were discussed and he is agreeable to proceed.  Differences between peritoneal dialysis and hemodialysis were discussed.    DESCRIPTION:  After obtaining full informed written consent, the patient was brought back to the operating room and placed supine upon the operating table. The patient received IV antibiotics prior to induction. After obtaining adequate anesthesia, the abdomen was prepped and draped in the standard fashion.   A small vertical incision was made just lateral to the midline above the umbilicus and the dissection was carried down through the soft tissue to expose the fascia. The fascia is then elevated with a bone hook Varies needle is introduced and a saline test was performed indicating intraperitoneal location and insufflated of the abdomen with carbon dioxide is performed to 15 mmHg pressure. A 5 mm trocar is then placed again maintaining elevation of the fascia with a bone hook.  A incision is then made in the left lower quadrant to the lateral portion of the rectus muscle the dissection is carried down through the soft tissues and the fascia is exposed. Small incision is made in the fascia with the Bovie and a pursestring suture of 0 Vicryl is placed.   Seldinger needle was then inserted under direct visualization and the wire introduced under the view of the camera trocar was  then placed and the catheter was advanced into the abdominal cavity over the trocar. Under direct visualization the catheters curled portion was positioned deep into the pelvis just to the right of midline. It was irrigated with heparinized saline. The deep cuff was secured to the fascial pursestring suture. A small counterincision was made in the right upper quadrant of the abdomen and the catheter was brought out this site. The appropriate distal connectors were placed, and I then placed 300 cc of saline through the catheter into the pelvis. The abdomen was desufflated. Immediately, 250 cc of effluent returned through the catheter when the bag was placed to gravity. I took one more look with the camera to ensure that the PD catheter was in the pelvis and it was.   I then closed the incisions with a 2-0 Vicryl in the right lower quadrant incision and subsequently 3-0 Vicryl and 4-0 Monocryl, the other incisions were closed with 3-0 Vicryl and 4-0 Monocryl and placed Dermabond as dressing. Dry dressing was placed around the catheter exit site. The patient was then awakened from anesthesia and taken to the recovery room in stable condition having tolerated the procedure well.   COMPLICATIONS: None  CONDITION: None  Katha Cabal 09/03/2015, 3:32 PM

## 2015-09-03 NOTE — Anesthesia Procedure Notes (Signed)
Procedure Name: Intubation Date/Time: 09/03/2015 2:17 PM Performed by: Nelda Marseille Pre-anesthesia Checklist: Patient identified, Patient being monitored, Timeout performed, Emergency Drugs available and Suction available Patient Re-evaluated:Patient Re-evaluated prior to inductionOxygen Delivery Method: Circle system utilized Preoxygenation: Pre-oxygenation with 100% oxygen Intubation Type: IV induction Ventilation: Mask ventilation without difficulty Laryngoscope Size: Mac and 3 Grade View: Grade I Tube type: Oral Tube size: 7.5 mm Number of attempts: 1 Airway Equipment and Method: Stylet Placement Confirmation: ETT inserted through vocal cords under direct vision,  positive ETCO2 and breath sounds checked- equal and bilateral Secured at: 21 cm Tube secured with: Tape Dental Injury: Teeth and Oropharynx as per pre-operative assessment

## 2015-09-03 NOTE — Transfer of Care (Signed)
Immediate Anesthesia Transfer of Care Note  Patient: Terrance Reynolds  Procedure(s) Performed: Procedure(s): INSERTION OF PERITONEAL DIALYSIS CATHETER (N/A)  Patient Location: PACU  Anesthesia Type:General  Level of Consciousness: sedated  Airway & Oxygen Therapy: Patient Spontanous Breathing and Patient connected to face mask oxygen  Post-op Assessment: Report given to RN and Post -op Vital signs reviewed and stable  Post vital signs: Reviewed and stable  Last Vitals:  Filed Vitals:   09/03/15 1537  BP: 123/78  Pulse: 88  Temp: 37 C  Resp: 14    Complications: No apparent anesthesia complications

## 2015-09-03 NOTE — OR Nursing (Signed)
MD in to see patient earlier update not in computer- Beryle Quant RN notified - will get md to update in computer.

## 2015-09-03 NOTE — H&P (Signed)
[facility] History & Physical Update  The patient was interviewed and re-examined.  The patient's previous History and Physical has been reviewed and is unchanged.  There is no change in the plan of care. We plan to proceed with the scheduled procedure.  Franklin Baumbach, Dolores Lory, MD  09/03/2015, 2:10 PM

## 2015-09-03 NOTE — Discharge Instructions (Signed)

## 2015-09-03 NOTE — Anesthesia Preprocedure Evaluation (Signed)
Anesthesia Evaluation  Patient identified by MRN, date of birth, ID band Patient awake    Reviewed: Allergy & Precautions, NPO status , Patient's Chart, lab work & pertinent test results, reviewed documented beta blocker date and time   History of Anesthesia Complications Negative for: history of anesthetic complications  Airway Mallampati: I       Dental  (+) Teeth Intact   Pulmonary neg pulmonary ROS, Current Smoker,           Cardiovascular hypertension, Pt. on medications and Pt. on home beta blockers      Neuro/Psych negative neurological ROS     GI/Hepatic negative GI ROS, GERD (pt denies)  ,(+) Hepatitis -, C  Endo/Other  negative endocrine ROS  Renal/GU ESRF and DialysisRenal disease     Musculoskeletal   Abdominal   Peds  Hematology  (+) anemia ,   Anesthesia Other Findings   Reproductive/Obstetrics                             Anesthesia Physical Anesthesia Plan  ASA: IV  Anesthesia Plan: General   Post-op Pain Management:    Induction: Intravenous  Airway Management Planned: Oral ETT  Additional Equipment:   Intra-op Plan:   Post-operative Plan:   Informed Consent: I have reviewed the patients History and Physical, chart, labs and discussed the procedure including the risks, benefits and alternatives for the proposed anesthesia with the patient or authorized representative who has indicated his/her understanding and acceptance.     Plan Discussed with:   Anesthesia Plan Comments:         Anesthesia Quick Evaluation

## 2015-09-04 ENCOUNTER — Encounter: Payer: Self-pay | Admitting: Vascular Surgery

## 2015-09-05 NOTE — Anesthesia Postprocedure Evaluation (Signed)
  Anesthesia Post-op Note  Patient: Terrance Reynolds  Procedure(s) Performed: Procedure(s): INSERTION OF PERITONEAL DIALYSIS CATHETER (N/A)  Anesthesia type:General  Patient location: PACU  Post pain: Pain level controlled  Post assessment: Post-op Vital signs reviewed, Patient's Cardiovascular Status Stable, Respiratory Function Stable, Patent Airway and No signs of Nausea or vomiting  Post vital signs: Reviewed and stable  Last Vitals:  Filed Vitals:   09/03/15 1716  BP: 139/97  Pulse: 89  Temp: 36.1 C  Resp: 20    Level of consciousness: awake, alert  and patient cooperative  Complications: No apparent anesthesia complications

## 2015-10-23 ENCOUNTER — Other Ambulatory Visit: Payer: Self-pay | Admitting: Vascular Surgery

## 2015-10-23 DIAGNOSIS — T85691A Other mechanical complication of intraperitoneal dialysis catheter, initial encounter: Secondary | ICD-10-CM

## 2015-10-24 ENCOUNTER — Encounter: Admission: RE | Payer: Self-pay | Source: Ambulatory Visit

## 2015-10-24 ENCOUNTER — Ambulatory Visit: Admission: RE | Admit: 2015-10-24 | Payer: Medicaid Other | Source: Ambulatory Visit | Admitting: Vascular Surgery

## 2015-10-24 SURGERY — DIALYSIS/PERMA CATHETER INSERTION
Anesthesia: Moderate Sedation

## 2015-10-29 ENCOUNTER — Ambulatory Visit: Payer: Medicaid Other

## 2015-11-03 ENCOUNTER — Other Ambulatory Visit: Payer: Self-pay | Admitting: *Deleted

## 2015-11-03 DIAGNOSIS — Z01810 Encounter for preprocedural cardiovascular examination: Secondary | ICD-10-CM

## 2015-11-03 DIAGNOSIS — T82510A Breakdown (mechanical) of surgically created arteriovenous fistula, initial encounter: Secondary | ICD-10-CM

## 2015-11-06 ENCOUNTER — Ambulatory Visit
Admission: RE | Admit: 2015-11-06 | Discharge: 2015-11-06 | Disposition: A | Discharge status: Home / Self Care | Payer: Medicaid Other | Source: Ambulatory Visit | Attending: Vascular Surgery | Admitting: Vascular Surgery

## 2015-11-06 DIAGNOSIS — X58XXXA Exposure to other specified factors, initial encounter: Secondary | ICD-10-CM | POA: Insufficient documentation

## 2015-11-06 DIAGNOSIS — T85691A Other mechanical complication of intraperitoneal dialysis catheter, initial encounter: Secondary | ICD-10-CM | POA: Diagnosis not present

## 2015-11-06 DIAGNOSIS — K5900 Constipation, unspecified: Secondary | ICD-10-CM | POA: Insufficient documentation

## 2015-11-07 ENCOUNTER — Ambulatory Visit: Payer: Medicaid Other | Admitting: Vascular Surgery

## 2015-11-07 ENCOUNTER — Encounter (HOSPITAL_COMMUNITY): Payer: Medicaid Other

## 2015-11-07 ENCOUNTER — Other Ambulatory Visit (HOSPITAL_COMMUNITY): Payer: Medicaid Other

## 2015-11-27 ENCOUNTER — Encounter: Payer: Self-pay | Admitting: Vascular Surgery

## 2015-12-02 ENCOUNTER — Other Ambulatory Visit: Payer: Self-pay | Admitting: Vascular Surgery

## 2015-12-04 ENCOUNTER — Encounter (HOSPITAL_COMMUNITY): Payer: Medicaid Other

## 2015-12-04 ENCOUNTER — Encounter
Admission: RE | Admit: 2015-12-04 | Discharge: 2015-12-04 | Disposition: A | Discharge status: Home / Self Care | Payer: Medicaid Other | Source: Ambulatory Visit | Attending: Vascular Surgery | Admitting: Vascular Surgery

## 2015-12-04 ENCOUNTER — Other Ambulatory Visit (HOSPITAL_COMMUNITY): Payer: Medicaid Other

## 2015-12-04 ENCOUNTER — Ambulatory Visit: Payer: Medicaid Other | Admitting: Vascular Surgery

## 2015-12-04 DIAGNOSIS — Z01812 Encounter for preprocedural laboratory examination: Secondary | ICD-10-CM | POA: Insufficient documentation

## 2015-12-04 HISTORY — DX: Anxiety disorder, unspecified: F41.9

## 2015-12-04 HISTORY — DX: Unspecified osteoarthritis, unspecified site: M19.90

## 2015-12-04 LAB — CBC WITH DIFFERENTIAL/PLATELET
Basophils Absolute: 0.1 10*3/uL (ref 0–0.1)
Basophils Relative: 1 %
EOS ABS: 0.2 10*3/uL (ref 0–0.7)
EOS PCT: 5 %
HCT: 32.5 % — ABNORMAL LOW (ref 40.0–52.0)
HEMOGLOBIN: 11.2 g/dL — AB (ref 13.0–18.0)
LYMPHS ABS: 1.2 10*3/uL (ref 1.0–3.6)
Lymphocytes Relative: 29 %
MCH: 29.7 pg (ref 26.0–34.0)
MCHC: 34.4 g/dL (ref 32.0–36.0)
MCV: 86.1 fL (ref 80.0–100.0)
MONOS PCT: 11 %
Monocytes Absolute: 0.5 10*3/uL (ref 0.2–1.0)
NEUTROS PCT: 54 %
Neutro Abs: 2.2 10*3/uL (ref 1.4–6.5)
Platelets: 238 10*3/uL (ref 150–440)
RBC: 3.77 MIL/uL — ABNORMAL LOW (ref 4.40–5.90)
RDW: 12.3 % (ref 11.5–14.5)
WBC: 4.1 10*3/uL (ref 3.8–10.6)

## 2015-12-04 LAB — BASIC METABOLIC PANEL
Anion gap: 10 (ref 5–15)
BUN: 61 mg/dL — AB (ref 6–20)
CHLORIDE: 104 mmol/L (ref 101–111)
CO2: 23 mmol/L (ref 22–32)
CREATININE: 4.71 mg/dL — AB (ref 0.61–1.24)
Calcium: 9 mg/dL (ref 8.9–10.3)
GFR calc Af Amer: 17 mL/min — ABNORMAL LOW (ref >60)
GFR calc non Af Amer: 15 mL/min — ABNORMAL LOW (ref >60)
Glucose, Bld: 88 mg/dL (ref 65–99)
Potassium: 4.2 mmol/L (ref 3.5–5.1)
Sodium: 137 mmol/L (ref 135–145)

## 2015-12-04 LAB — ABO/RH: ABO/RH(D): O POS

## 2015-12-04 LAB — APTT: aPTT: 37 seconds — ABNORMAL HIGH (ref 24–36)

## 2015-12-04 LAB — TYPE AND SCREEN
ABO/RH(D): O POS
Antibody Screen: NEGATIVE

## 2015-12-04 LAB — PROTIME-INR
INR: 1
PROTHROMBIN TIME: 13.4 s (ref 11.4–15.0)

## 2015-12-04 LAB — SURGICAL PCR SCREEN
MRSA, PCR: NEGATIVE
STAPHYLOCOCCUS AUREUS: NEGATIVE

## 2015-12-04 NOTE — Patient Instructions (Signed)
  Your procedure is scheduled on: December 12, 2015 (Friday) ,Report to Day Surgery.[facility]) Second Floor To find out your arrival time please call [phone] between 1PM - 3PM on December 11, 2015 (Thursday).  Remember: Instructions that are not followed completely may result in serious medical risk, up to and including death, or upon the discretion of your surgeon and anesthesiologist your surgery may need to be rescheduled.    __x__ 1. Do not eat food or drink liquids after midnight. No gum chewing or hard candies.     __x__ 2. No Alcohol for 24 hours before or after surgery.   ____ 3. Bring all medications with you on the day of surgery if instructed.    _x_ 4. Notify your doctor if there is any change in your medical condition     (cold, fever, infections).     Do not wear jewelry, make-up, hairpins, clips or nail polish.  Do not wear lotions, powders, or perfumes. You may wear deodorant.  Do not shave 48 hours prior to surgery. Men may shave face and neck.  Do not bring valuables to the hospital.    [facility] is not responsible for any belongings or valuables.               Contacts, dentures or bridgework may not be worn into surgery.  Leave your suitcase in the car. After surgery it may be brought to your room.  For patients admitted to the hospital, discharge time is determined by your                treatment team.   Patients discharged the day of surgery will not be allowed to drive home.   Please read over the following fact sheets that you were given:   MRSA Information and Surgical Site Infection Prevention   ____ Take these medicines the morning of surgery with A SIP OF WATER:    1. Amlodipine  2. Labetalol  3. HydrALAZINE  4.  5.  6.  ____ Fleet Enema (as directed)   __x__ Use CHG Soap as directed (SAGE WIPES)  ____ Use inhalers on the day of surgery  ____ Stop metformin 2 days prior to surgery    ____ Take 1/2 of usual insulin dose the night  before surgery and none on the morning of surgery.   __x__ Stop Coumadin/Plavix/aspirin on (DO NOT TAKE ASPIRIN THE DAY OF SURGERY)  _x___ Stop Anti-inflammatories on (NO NSAIDS) TYLENOL OK TO TAKE FOR PAIN IF NEEDED   ____ Stop supplements until after surgery.    ____ Bring C-Pap to the hospital.

## 2015-12-12 ENCOUNTER — Ambulatory Visit
Admission: RE | Admit: 2015-12-12 | Discharge: 2015-12-12 | Disposition: A | Discharge status: Home / Self Care | Payer: Medicaid Other | Source: Ambulatory Visit | Attending: Vascular Surgery | Admitting: Vascular Surgery

## 2015-12-12 ENCOUNTER — Ambulatory Visit: Payer: Medicaid Other | Admitting: Anesthesiology

## 2015-12-12 ENCOUNTER — Encounter: Payer: Self-pay | Admitting: *Deleted

## 2015-12-12 ENCOUNTER — Encounter
Admission: RE | Disposition: A | Discharge status: Home / Self Care | Payer: Self-pay | Source: Ambulatory Visit | Attending: Vascular Surgery

## 2015-12-12 DIAGNOSIS — I499 Cardiac arrhythmia, unspecified: Secondary | ICD-10-CM | POA: Insufficient documentation

## 2015-12-12 DIAGNOSIS — J449 Chronic obstructive pulmonary disease, unspecified: Secondary | ICD-10-CM | POA: Insufficient documentation

## 2015-12-12 DIAGNOSIS — Z833 Family history of diabetes mellitus: Secondary | ICD-10-CM | POA: Insufficient documentation

## 2015-12-12 DIAGNOSIS — Z79899 Other long term (current) drug therapy: Secondary | ICD-10-CM | POA: Insufficient documentation

## 2015-12-12 DIAGNOSIS — T859XXA Unspecified complication of internal prosthetic device, implant and graft, initial encounter: Secondary | ICD-10-CM | POA: Diagnosis present

## 2015-12-12 DIAGNOSIS — F17200 Nicotine dependence, unspecified, uncomplicated: Secondary | ICD-10-CM | POA: Insufficient documentation

## 2015-12-12 DIAGNOSIS — F419 Anxiety disorder, unspecified: Secondary | ICD-10-CM | POA: Diagnosis not present

## 2015-12-12 DIAGNOSIS — X58XXXA Exposure to other specified factors, initial encounter: Secondary | ICD-10-CM | POA: Insufficient documentation

## 2015-12-12 DIAGNOSIS — Z8719 Personal history of other diseases of the digestive system: Secondary | ICD-10-CM | POA: Diagnosis not present

## 2015-12-12 DIAGNOSIS — Z8249 Family history of ischemic heart disease and other diseases of the circulatory system: Secondary | ICD-10-CM | POA: Insufficient documentation

## 2015-12-12 DIAGNOSIS — Z992 Dependence on renal dialysis: Secondary | ICD-10-CM | POA: Insufficient documentation

## 2015-12-12 DIAGNOSIS — D649 Anemia, unspecified: Secondary | ICD-10-CM | POA: Diagnosis not present

## 2015-12-12 DIAGNOSIS — I120 Hypertensive chronic kidney disease with stage 5 chronic kidney disease or end stage renal disease: Secondary | ICD-10-CM | POA: Diagnosis not present

## 2015-12-12 DIAGNOSIS — N186 End stage renal disease: Secondary | ICD-10-CM | POA: Diagnosis not present

## 2015-12-12 DIAGNOSIS — T85898A Other specified complication of other internal prosthetic devices, implants and grafts, initial encounter: Secondary | ICD-10-CM | POA: Diagnosis not present

## 2015-12-12 DIAGNOSIS — Z823 Family history of stroke: Secondary | ICD-10-CM | POA: Insufficient documentation

## 2015-12-12 HISTORY — PX: CAPD INSERTION: SHX5233

## 2015-12-12 HISTORY — PX: AV FISTULA PLACEMENT: SHX1204

## 2015-12-12 LAB — POCT I-STAT 4, (NA,K, GLUC, HGB,HCT)
GLUCOSE: 85 mg/dL (ref 65–99)
HCT: 32 % — ABNORMAL LOW (ref 39.0–52.0)
Hemoglobin: 10.9 g/dL — ABNORMAL LOW (ref 13.0–17.0)
Potassium: 4.1 mmol/L (ref 3.5–5.1)
Sodium: 139 mmol/L (ref 135–145)

## 2015-12-12 SURGERY — INSERTION OF ARTERIOVENOUS (AV) GORE-TEX GRAFT ARM
Anesthesia: General | Site: Arm Upper | Wound class: Clean

## 2015-12-12 MED ORDER — HEPARIN SODIUM (PORCINE) 5000 UNIT/ML IJ SOLN
INTRAMUSCULAR | Status: AC
  Filled 2015-12-12: qty 1

## 2015-12-12 MED ORDER — ONDANSETRON HCL 4 MG/2ML IJ SOLN
INTRAMUSCULAR | Status: DC | PRN
  Administered 2015-12-12: 4 mg via INTRAVENOUS

## 2015-12-12 MED ORDER — GLYCOPYRROLATE 0.2 MG/ML IJ SOLN
INTRAMUSCULAR | Status: DC | PRN
  Administered 2015-12-12 (×2): 0.6 mg via INTRAVENOUS

## 2015-12-12 MED ORDER — HYDROMORPHONE HCL 1 MG/ML IJ SOLN
INTRAMUSCULAR | Status: DC | PRN
  Administered 2015-12-12 (×2): .5 mg via INTRAVENOUS

## 2015-12-12 MED ORDER — BUPIVACAINE-EPINEPHRINE (PF) 0.5% -1:200000 IJ SOLN
INTRAMUSCULAR | Status: DC | PRN
  Administered 2015-12-12: 26 mL

## 2015-12-12 MED ORDER — HYDROCODONE-ACETAMINOPHEN 5-325 MG PO TABS: 1.0000 | ORAL_TABLET | Freq: Four times a day (QID) | ORAL | Status: DC | PRN

## 2015-12-12 MED ORDER — MIDAZOLAM HCL 2 MG/2ML IJ SOLN
2.0000 mg | Freq: Once | INTRAMUSCULAR | Status: AC
  Administered 2015-12-12: 2 mg via INTRAVENOUS

## 2015-12-12 MED ORDER — FAMOTIDINE 20 MG PO TABS
20.0000 mg | ORAL_TABLET | Freq: Once | ORAL | Status: AC
  Administered 2015-12-12: 20 mg via ORAL

## 2015-12-12 MED ORDER — PAPAVERINE HCL 30 MG/ML IJ SOLN
INTRAMUSCULAR | Status: AC
  Filled 2015-12-12: qty 2

## 2015-12-12 MED ORDER — NEOSTIGMINE METHYLSULFATE 10 MG/10ML IV SOLN
INTRAVENOUS | Status: DC | PRN
  Administered 2015-12-12 (×2): 3.5 mg via INTRAVENOUS

## 2015-12-12 MED ORDER — FENTANYL CITRATE (PF) 100 MCG/2ML IJ SOLN
INTRAMUSCULAR | Status: DC | PRN
  Administered 2015-12-12: 100 ug via INTRAVENOUS

## 2015-12-12 MED ORDER — MIDAZOLAM HCL 2 MG/2ML IJ SOLN
INTRAMUSCULAR | Status: AC
  Administered 2015-12-12: 2 mg via INTRAVENOUS
  Filled 2015-12-12: qty 2

## 2015-12-12 MED ORDER — ROCURONIUM BROMIDE 100 MG/10ML IV SOLN
INTRAVENOUS | Status: DC | PRN
  Administered 2015-12-12: 10 mg via INTRAVENOUS
  Administered 2015-12-12: 35 mg via INTRAVENOUS
  Administered 2015-12-12: 10 mg via INTRAVENOUS

## 2015-12-12 MED ORDER — BUPIVACAINE-EPINEPHRINE (PF) 0.5% -1:200000 IJ SOLN
INTRAMUSCULAR | Status: AC
  Filled 2015-12-12: qty 30

## 2015-12-12 MED ORDER — FAMOTIDINE 20 MG PO TABS
ORAL_TABLET | ORAL | Status: AC
  Administered 2015-12-12: 20 mg via ORAL
  Filled 2015-12-12: qty 1

## 2015-12-12 MED ORDER — CLINDAMYCIN PHOSPHATE 300 MG/50ML IV SOLN: 300.0000 mg | Freq: Once | INTRAVENOUS | Status: DC

## 2015-12-12 MED ORDER — SODIUM CHLORIDE 0.9 % IV SOLN
INTRAVENOUS | Status: DC
  Administered 2015-12-12 (×3): via INTRAVENOUS

## 2015-12-12 MED ORDER — SODIUM CHLORIDE 0.9 % IV SOLN: INTRAVENOUS | Status: DC

## 2015-12-12 MED ORDER — BACITRACIN 500 UNIT/GM EX OINT
TOPICAL_OINTMENT | CUTANEOUS | Status: DC | PRN
  Administered 2015-12-12: 1 via TOPICAL

## 2015-12-12 MED ORDER — DEXMEDETOMIDINE HCL 200 MCG/2ML IV SOLN
INTRAVENOUS | Status: DC | PRN
  Administered 2015-12-12: 8 ug via INTRAVENOUS

## 2015-12-12 MED ORDER — BUPIVACAINE HCL (PF) 0.5 % IJ SOLN
INTRAMUSCULAR | Status: AC
  Filled 2015-12-12: qty 30

## 2015-12-12 MED ORDER — DEXAMETHASONE SODIUM PHOSPHATE 10 MG/ML IJ SOLN
INTRAMUSCULAR | Status: DC | PRN
  Administered 2015-12-12: 10 mg via INTRAVENOUS

## 2015-12-12 MED ORDER — PROPOFOL 10 MG/ML IV BOLUS
INTRAVENOUS | Status: DC | PRN
  Administered 2015-12-12: 200 mg via INTRAVENOUS

## 2015-12-12 MED ORDER — FENTANYL CITRATE (PF) 100 MCG/2ML IJ SOLN
INTRAMUSCULAR | Status: AC
  Administered 2015-12-12: 25 ug via INTRAVENOUS
  Filled 2015-12-12: qty 2

## 2015-12-12 MED ORDER — CLINDAMYCIN PHOSPHATE 300 MG/50ML IV SOLN
INTRAVENOUS | Status: AC
  Administered 2015-12-12: 300 mg via INTRAVENOUS
  Filled 2015-12-12: qty 50

## 2015-12-12 MED ORDER — ONDANSETRON HCL 4 MG/2ML IJ SOLN: 4.0000 mg | Freq: Once | INTRAMUSCULAR | Status: DC | PRN

## 2015-12-12 MED ORDER — LIDOCAINE HCL (CARDIAC) 20 MG/ML IV SOLN
INTRAVENOUS | Status: DC | PRN
  Administered 2015-12-12: 30 mg via INTRAVENOUS

## 2015-12-12 MED ORDER — BACITRACIN ZINC 500 UNIT/GM EX OINT
TOPICAL_OINTMENT | CUTANEOUS | Status: AC
  Filled 2015-12-12: qty 0.9

## 2015-12-12 MED ORDER — MIDAZOLAM HCL 2 MG/2ML IJ SOLN
INTRAMUSCULAR | Status: DC | PRN
  Administered 2015-12-12: 2 mg via INTRAVENOUS

## 2015-12-12 MED ORDER — FENTANYL CITRATE (PF) 100 MCG/2ML IJ SOLN
25.0000 ug | INTRAMUSCULAR | Status: AC | PRN
  Administered 2015-12-12 (×6): 25 ug via INTRAVENOUS

## 2015-12-12 MED ORDER — HYDROCODONE-ACETAMINOPHEN 5-325 MG PO TABS
1.0000 | ORAL_TABLET | Freq: Four times a day (QID) | ORAL | Status: DC | PRN
  Administered 2015-12-12: 2 via ORAL

## 2015-12-12 MED ORDER — HYDROCODONE-ACETAMINOPHEN 5-325 MG PO TABS
ORAL_TABLET | ORAL | Status: AC
  Filled 2015-12-12: qty 2

## 2015-12-12 SURGICAL SUPPLY — 81 items
APPLIER CLIP 11 MED OPEN (CLIP)
APPLIER CLIP 9.375 SM OPEN (CLIP)
BAG COUNTER SPONGE EZ (MISCELLANEOUS) IMPLANT
BAG DECANTER FOR FLEXI CONT (MISCELLANEOUS) ×4 IMPLANT
BLADE SURG 15 STRL LF DISP TIS (BLADE) ×2 IMPLANT
BLADE SURG 15 STRL SS (BLADE) ×2
BLADE SURG SZ11 CARB STEEL (BLADE) ×4 IMPLANT
BOOT SUTURE AID YELLOW STND (SUTURE) ×4 IMPLANT
BRUSH SCRUB 4% CHG (MISCELLANEOUS) ×4 IMPLANT
CANISTER SUCT 1200ML W/VALVE (MISCELLANEOUS) ×4 IMPLANT
CHLORAPREP W/TINT 26ML (MISCELLANEOUS) ×4 IMPLANT
CLIP APPLIE 11 MED OPEN (CLIP) IMPLANT
CLIP APPLIE 9.375 SM OPEN (CLIP) IMPLANT
COUNTER SPONGE BAG EZ (MISCELLANEOUS)
DRAPE LAPAROTOMY 100X77 ABD (DRAPES) ×4 IMPLANT
DRESSING SURGICEL FIBRLLR 1X2 (HEMOSTASIS) ×2 IMPLANT
DRSG SURGICEL FIBRILLAR 1X2 (HEMOSTASIS) ×4
ELECT CAUTERY BLADE 6.4 (BLADE) ×4 IMPLANT
ELECT REM PT RETURN 9FT ADLT (ELECTROSURGICAL) ×4
ELECTRODE REM PT RTRN 9FT ADLT (ELECTROSURGICAL) ×2 IMPLANT
GAUZE SPONGE 4X4 12PLY STRL (GAUZE/BANDAGES/DRESSINGS) ×4 IMPLANT
GAUZE SPONGE NON-WVN 2X2 STRL (MISCELLANEOUS) ×2 IMPLANT
GLOVE BIO SURGEON STRL SZ7 (GLOVE) ×32 IMPLANT
GLOVE INDICATOR 7.5 STRL GRN (GLOVE) ×20 IMPLANT
GLOVE SURG SYN 8.0 (GLOVE) ×4 IMPLANT
GOWN STRL REUS W/ TWL LRG LVL3 (GOWN DISPOSABLE) ×12 IMPLANT
GOWN STRL REUS W/ TWL XL LVL3 (GOWN DISPOSABLE) ×4 IMPLANT
GOWN STRL REUS W/TWL LRG LVL3 (GOWN DISPOSABLE) ×12
GOWN STRL REUS W/TWL XL LVL3 (GOWN DISPOSABLE) ×4
GRAFT PROPATEN STD WALL 4 7X45 (Vascular Products) ×4 IMPLANT
HANDLE YANKAUER SUCT BULB TIP (MISCELLANEOUS) ×4 IMPLANT
IV NS 500ML (IV SOLUTION) ×2
IV NS 500ML BAXH (IV SOLUTION) ×2 IMPLANT
KIT RM TURNOVER STRD PROC AR (KITS) ×4 IMPLANT
LABEL OR SOLS (LABEL) ×4 IMPLANT
LIQUID BAND (GAUZE/BANDAGES/DRESSINGS) ×12 IMPLANT
LOOP RED MAXI  1X406MM (MISCELLANEOUS) ×2
LOOP VESSEL MAXI 1X406 RED (MISCELLANEOUS) ×2 IMPLANT
LOOP VESSEL MINI 0.8X406 BLUE (MISCELLANEOUS) ×2 IMPLANT
LOOPS BLUE MINI 0.8X406MM (MISCELLANEOUS) ×2
NEEDLE FILTER BLUNT 18X 1/2SAF (NEEDLE) ×2
NEEDLE FILTER BLUNT 18X1 1/2 (NEEDLE) ×2 IMPLANT
NEEDLE HYPO 25X1 1.5 SAFETY (NEEDLE) ×4 IMPLANT
NEEDLE INSUFFLATION 150MM (ENDOMECHANICALS) IMPLANT
NS IRRIG 500ML POUR BTL (IV SOLUTION) ×4 IMPLANT
PACK EXTREMITY ARMC (MISCELLANEOUS) ×4 IMPLANT
PACK LAP CHOLECYSTECTOMY (MISCELLANEOUS) ×4 IMPLANT
PAD PREP 24X41 OB/GYN DISP (PERSONAL CARE ITEMS) ×4 IMPLANT
PENCIL ELECTRO HAND CTR (MISCELLANEOUS) ×4 IMPLANT
PUNCH SURGICAL ROTATE 2.7MM (MISCELLANEOUS) IMPLANT
SLEEVE ENDOPATH XCEL 5M (ENDOMECHANICALS) IMPLANT
SPONGE LAP 18X18 5 PK (GAUZE/BANDAGES/DRESSINGS) ×4 IMPLANT
SPONGE VERSALON 2X2 STRL (MISCELLANEOUS) ×2
STOCKINETTE STRL 4IN 9604848 (GAUZE/BANDAGES/DRESSINGS) ×4 IMPLANT
SUT ETHIBOND 0 36 GRN (SUTURE) IMPLANT
SUT GTX CV-6 30 (SUTURE) ×8 IMPLANT
SUT MNCRL 4-0 (SUTURE) ×2
SUT MNCRL 4-0 27XMFL (SUTURE) ×2
SUT MNCRL AB 4-0 PS2 18 (SUTURE) ×4 IMPLANT
SUT MNCRL+ 5-0 UNDYED PC-3 (SUTURE) ×4 IMPLANT
SUT MONOCRYL 5-0 (SUTURE) ×4
SUT PROLENE 6 0 BV (SUTURE) ×12 IMPLANT
SUT SILK 2 0 (SUTURE) ×2
SUT SILK 2 0 SH (SUTURE) ×4 IMPLANT
SUT SILK 2-0 18XBRD TIE 12 (SUTURE) ×2 IMPLANT
SUT SILK 3 0 (SUTURE) ×2
SUT SILK 3-0 18XBRD TIE 12 (SUTURE) ×2 IMPLANT
SUT SILK 4 0 (SUTURE) ×2
SUT SILK 4-0 18XBRD TIE 12 (SUTURE) ×2 IMPLANT
SUT VIC AB 0 CT1 36 (SUTURE) ×4 IMPLANT
SUT VIC AB 3-0 SH 27 (SUTURE) ×6
SUT VIC AB 3-0 SH 27X BRD (SUTURE) ×6 IMPLANT
SUT VICRYL 0 AB UR-6 (SUTURE) IMPLANT
SUTURE MNCRL 4-0 27XMF (SUTURE) ×2 IMPLANT
SYR 20CC LL (SYRINGE) ×4 IMPLANT
SYR 3ML LL SCALE MARK (SYRINGE) ×4 IMPLANT
SYR 50ML LL SCALE MARK (SYRINGE) IMPLANT
TROCAR XCEL NON-BLD 5MMX100MML (ENDOMECHANICALS) IMPLANT
TUBING CONNECTING 10 (TUBING) ×3 IMPLANT
TUBING CONNECTING 10' (TUBING) ×1
TUBING INSUFFLATOR HI FLOW (MISCELLANEOUS) IMPLANT

## 2015-12-12 NOTE — Discharge Instructions (Signed)

## 2015-12-12 NOTE — Op Note (Signed)
OPERATIVE NOTE   PROCEDURE: left brachial axillary arteriovenous graft placement  PRE-OPERATIVE DIAGNOSIS: End Stage Renal Disease  POST-OPERATIVE DIAGNOSIS: End Stage Renal Disease  SURGEON: Terrance Reynolds, Terrance Reynolds  ASSISTANT(S): Ms. Hezzie Bump  ANESTHESIA: general  ESTIMATED BLOOD LOSS: <50 cc  FINDING(S): 4 mm brachial artery 8 mm axillary vein  SPECIMEN(S):  none  INDICATIONS:   Terrance Reynolds is a 35 y.o. male who presents with end stage renal disease.  The patient is scheduled for left brachial axillary AV graft placement.  The patient is aware the risks include but are not limited to: bleeding, infection, steal syndrome, nerve damage, ischemic monomelic neuropathy, failure to mature, and need for additional procedures.  The patient is aware of the risks of the procedure and elects to proceed forward.  DESCRIPTION: After full informed written consent was obtained from the patient, the patient was brought back to the operating room and placed supine upon the operating table.  Prior to induction, the patient received IV antibiotics.   After obtaining adequate anesthesia, the patient was then prepped and draped in the standard fashion for a left arm access procedure.    A linear incision was then created along the medial border of the biceps muscle just proximal to the antecubital crease and the brachial artery which was exposed through. The brachial artery was then looped proximally and distally with Silastic Vesseloops. Side branches were controlled with 4-0 silk ties.  Attention was then turned to the exposure of the axillary vein. Linear incision was then created medial to the proximal portion of the biceps at the level of the anterior axillary crease. The axillary vein was exposed and again looped proximally and distally with Silastic vessel loops. Associated tributaries were also controlled with Silastic Vesseloops.  The Gore tunneler was then delivered onto the  field and a subcutaneous path was made from the arterial incision to the venous incision. A 4-7 tapered PTFE propatent graft by Terrance Reynolds was then pulled through the subcutaneous tunnel. The arterial 4 mm portion was then approximated to the brachial artery. Brachial artery was controlled proximally and distally with the Silastic Vesseloops. Arteriotomy was made with an 11 blade scalpel and extended with Potts scissors and a 6-0 Prolene stay suture was placed. End graft to side brachial artery anastomosis was then fashioned with running CV 6 suture. Flushing maneuvers were performed suture line was hemostatic and the graft was then assessed for proper position and ease of future cannulation. Heparinized saline was infused into the vein and the graft was clamped with a vascular clamp. With the graft pressurized it was approximated to the axillary vein in its native bed and then marked with a surgical marker. The vein was then delivered into the surgical field and controlled with the Silastic vessel loops. Venotomy was then made with an 11 blade scalpel and extended with Potts scissors and a 6-0 Prolene suture was used as stay suture. The the graft was then sewn to the vein in an end graft to side vein fashion using running CV 6 suture.  Flushing maneuvers were performed and the artery was allowed to forward and back bleed.  Flow was then established through the AV graft  There was good  thrill in the venous outflow, and there was 1+ palpable radial pulse.  At this point, I irrigated out the surgical wounds.  There was no further active bleeding.  The subcutaneous tissue was reapproximated with a running stitch of 3-0 Vicryl.  The skin  was then reapproximated with a running subcuticular stitch of 4-0 Vicryl.  The skin was then cleaned, dried, and reinforced with Dermabond.    The patient tolerated this procedure well.   COMPLICATIONS: None  CONDITION: [facility]  Office:  [phone]   12/12/2015, 1:49 PM

## 2015-12-12 NOTE — Anesthesia Preprocedure Evaluation (Signed)
Anesthesia Evaluation  Patient identified by MRN, date of birth, ID band Patient awake    Reviewed: Allergy & Precautions, NPO status , Patient's Chart, lab work & pertinent test results  Airway Mallampati: II       Dental  (+) Teeth Intact   Pulmonary COPD, Current Smoker,     + decreased breath sounds      Cardiovascular Exercise Tolerance: Good hypertension, Pt. on medications and Pt. on home beta blockers  Rhythm:Regular Rate:Normal     Neuro/Psych Anxiety    GI/Hepatic GERD  ,(+)     substance abuse  , Hepatitis -  Endo/Other    Renal/GU DialysisRenal disease     Musculoskeletal   Abdominal Normal abdominal exam  (+)   Peds  Hematology  (+) anemia ,   Anesthesia Other Findings   Reproductive/Obstetrics                             Anesthesia Physical Anesthesia Plan  ASA: III  Anesthesia Plan: General   Post-op Pain Management:    Induction: Intravenous  Airway Management Planned: Oral ETT  Additional Equipment:   Intra-op Plan:   Post-operative Plan: Extubation in OR  Informed Consent: I have reviewed the patients History and Physical, chart, labs and discussed the procedure including the risks, benefits and alternatives for the proposed anesthesia with the patient or authorized representative who has indicated his/her understanding and acceptance.     Plan Discussed with: CRNA  Anesthesia Plan Comments:         Anesthesia Quick Evaluation

## 2015-12-12 NOTE — H&P (Signed)
 [facility] History & Physical Update  The patient was interviewed and re-examined.  The patient's previous History and Physical has been reviewed and is unchanged.  There is no change in the plan of care. We plan to proceed with the scheduled procedure.  Thanh Pomerleau, Dolores Lory, MD  12/12/2015, 11:05 AM

## 2015-12-12 NOTE — Transfer of Care (Signed)
Immediate Anesthesia Transfer of Care Note  Patient: Terrance Reynolds  Procedure(s) Performed: Procedure(s): INSERTION OF ARTERIOVENOUS (AV) GORE-TEX GRAFT ARm  ( BRACH/AXILLARY GRAFT ) (Left) Removal of peritoneal dialysis catheter (N/A)  Patient Location: PACU  Anesthesia Type:General  Level of Consciousness: awake, alert  and oriented  Airway & Oxygen Therapy: Patient Spontanous Breathing and Patient connected to face mask oxygen  Post-op Assessment: Report given to RN and Post -op Vital signs reviewed and stable  Post vital signs: Reviewed and stable  Last Vitals:  Filed Vitals:   12/12/15 1019  BP: 144/92  Pulse: 77  Temp: 35.6 C  Resp: 14    Complications: No apparent anesthesia complications

## 2015-12-12 NOTE — Anesthesia Procedure Notes (Signed)
Procedure Name: Intubation Date/Time: 12/12/2015 11:58 AM Performed by: Justus Memory Pre-anesthesia Checklist: Patient identified, Emergency Drugs available, Suction available and Patient being monitored Patient Re-evaluated:Patient Re-evaluated prior to inductionOxygen Delivery Method: Circle system utilized Preoxygenation: Pre-oxygenation with 100% oxygen Intubation Type: IV induction Ventilation: Mask ventilation without difficulty Laryngoscope Size: Mac and 3 Grade View: Grade I Tube type: Oral Tube size: 7.0 mm Number of attempts: 1 Airway Equipment and Method: Patient positioned with wedge pillow and Stylet Placement Confirmation: ETT inserted through vocal cords under direct vision,  positive ETCO2,  CO2 detector and breath sounds checked- equal and bilateral Secured at: 21 cm Tube secured with: Tape Dental Injury: Teeth and Oropharynx as per pre-operative assessment

## 2015-12-12 NOTE — Op Note (Signed)
  OPERATIVE NOTE   PROCEDURE: 1. Removal of a Peritoneal Dialysis Catheter  PRE-OPERATIVE DIAGNOSIS: Complication of dialysis catheter with poor dialysis by peritoneal method,  End Stage Renal Disease  POST-OPERATIVE DIAGNOSIS: Same  SURGEON: Schnier, Dolores Lory  ANESTHESIA: General with Local anesthetic    ESTIMATED BLOOD LOSS: Minimal   FINDING(S): 1. Catheter intact   SPECIMEN(S):  Catheter  INDICATIONS:   Terrance Reynolds is a 35 y.o. male who presents with intolerance to his PD catheter he is therefore converting to hemodialysis and is be catheters being removed. The risks and benefits of been reviewed all questions of been answered alternatives have been discussed the patient wishes to proceed with placement of an AV graft and removal of the PD catheter.  DESCRIPTION: After obtaining full informed written consent, the patient was positioned supine. The peritoneal dialysis catheter and surrounding area is prepped and draped in a sterile fashion. The cuff was localized.  After appropriate timeout is called, local anesthetic with epinephrine is infiltrated into the surrounding tissues around the cuff in the left lower quadrant. Previous incision is open with an 15 blade scalpel and the dissection was carried down to expose the cuff of the tunneled catheter.  The catheter is then freed from the surrounding attachments and adhesions. Once the catheter has been freed circumferentially a pursestring suture of 0 Vicryl is placed within the anterior rectus sheath. The catheter is then removed from the peritoneal cavity and the pursestring suture secured. The second cuff is then localized and dissected free. The catheter is transected just distal to the cuff and subsequently removed in 2 pieces. The lower quadrant incision is then irrigated and closed in layers with Vicryl.. A 4-0 Monocryl was used close the skin. Dermabond is applied to the incision.   Antibiotic ointment and a sterile  dressing is applied to the exit site. Patient tolerated procedure well and there were no complications.  COMPLICATIONS: None  CONDITION: Unchanged  Schnier, Dolores Lory. [facility] Office: [phone]  12/12/2015,2:47 PM

## 2015-12-15 ENCOUNTER — Encounter: Payer: Self-pay | Admitting: Vascular Surgery

## 2015-12-15 NOTE — Anesthesia Postprocedure Evaluation (Signed)
Anesthesia Post Note  Patient: Terrance Reynolds  Procedure(s) Performed: Procedure(s) (LRB): INSERTION OF ARTERIOVENOUS (AV) GORE-TEX GRAFT ARm  ( BRACH/AXILLARY GRAFT ) (Left) Removal of peritoneal dialysis catheter (N/A)  Patient location during evaluation: PACU Anesthesia Type: General Level of consciousness: awake Pain management: satisfactory to patient Vital Signs Assessment: post-procedure vital signs reviewed and stable Respiratory status: spontaneous breathing Cardiovascular status: blood pressure returned to baseline Anesthetic complications: no    Last Vitals:  Filed Vitals:   12/12/15 1615 12/12/15 1705  BP: 158/89 152/88  Pulse: 80 88  Temp:    Resp: 14 16    Last Pain:  Filed Vitals:   12/15/15 0913  PainSc: 8                  VAN STAVEREN,Houa Nie

## 2016-01-12 ENCOUNTER — Other Ambulatory Visit: Payer: Self-pay | Admitting: Vascular Surgery

## 2016-01-14 ENCOUNTER — Encounter (HOSPITAL_COMMUNITY): Payer: Self-pay | Admitting: *Deleted

## 2016-01-14 ENCOUNTER — Emergency Department (HOSPITAL_COMMUNITY): Payer: Medicaid Other

## 2016-01-14 ENCOUNTER — Emergency Department (HOSPITAL_COMMUNITY)
Admission: EM | Admit: 2016-01-14 | Discharge: 2016-01-14 | Disposition: A | Discharge status: Home / Self Care | Payer: Medicaid Other | Attending: Emergency Medicine | Admitting: Emergency Medicine

## 2016-01-14 DIAGNOSIS — M1990 Unspecified osteoarthritis, unspecified site: Secondary | ICD-10-CM | POA: Insufficient documentation

## 2016-01-14 DIAGNOSIS — R0981 Nasal congestion: Secondary | ICD-10-CM | POA: Diagnosis present

## 2016-01-14 DIAGNOSIS — N186 End stage renal disease: Secondary | ICD-10-CM | POA: Insufficient documentation

## 2016-01-14 DIAGNOSIS — R51 Headache: Secondary | ICD-10-CM | POA: Insufficient documentation

## 2016-01-14 DIAGNOSIS — F17210 Nicotine dependence, cigarettes, uncomplicated: Secondary | ICD-10-CM | POA: Insufficient documentation

## 2016-01-14 DIAGNOSIS — Z7982 Long term (current) use of aspirin: Secondary | ICD-10-CM | POA: Diagnosis not present

## 2016-01-14 DIAGNOSIS — Z79899 Other long term (current) drug therapy: Secondary | ICD-10-CM | POA: Insufficient documentation

## 2016-01-14 DIAGNOSIS — Z992 Dependence on renal dialysis: Secondary | ICD-10-CM | POA: Insufficient documentation

## 2016-01-14 DIAGNOSIS — I120 Hypertensive chronic kidney disease with stage 5 chronic kidney disease or end stage renal disease: Secondary | ICD-10-CM | POA: Diagnosis not present

## 2016-01-14 DIAGNOSIS — J069 Acute upper respiratory infection, unspecified: Secondary | ICD-10-CM | POA: Insufficient documentation

## 2016-01-14 MED ORDER — ALBUTEROL SULFATE HFA 108 (90 BASE) MCG/ACT IN AERS
2.0000 | INHALATION_SPRAY | Freq: Once | RESPIRATORY_TRACT | Status: AC
  Administered 2016-01-14: 2 via RESPIRATORY_TRACT
  Filled 2016-01-14: qty 6.7

## 2016-01-14 MED ORDER — OXYMETAZOLINE HCL 0.05 % NA SOLN: 1.0000 | Freq: Two times a day (BID) | NASAL | Status: DC

## 2016-01-14 MED ORDER — PREDNISONE 50 MG PO TABS
50.0000 mg | ORAL_TABLET | Freq: Once | ORAL | Status: AC
  Administered 2016-01-14: 50 mg via ORAL
  Filled 2016-01-14: qty 1

## 2016-01-14 MED ORDER — DEXAMETHASONE 4 MG PO TABS: 4.0000 mg | ORAL_TABLET | Freq: Two times a day (BID) | ORAL | Status: DC

## 2016-01-14 NOTE — Discharge Instructions (Signed)
Your chest x-ray is negative for acute problems. Please use salt water gargles concerning your throat. Use 2 squirts of Afrin spray 2 times daily over the next 5 days. Congestion. Please use of Inderal 2 puffs every 4 hours, and Decadron 4 mg 2 times daily for the congestion and issue with your lungs. Please follow-up with the [place] [facility] for recheck if not improving. Upper Respiratory Infection, Adult Most upper respiratory infections (URIs) are caused by a virus. A URI affects the nose, throat, and upper air passages. The most common type of URI is often called "the common cold." HOME CARE   Take medicines only as told by your doctor.  Gargle warm saltwater or take cough drops to comfort your throat as told by your doctor.  Use a warm mist humidifier or inhale steam from a shower to increase air moisture. This may make it easier to breathe.  Drink enough fluid to keep your pee (urine) clear or pale yellow.  Eat soups and other clear broths.  Have a healthy diet.  Rest as needed.  Go back to work when your fever is gone or your doctor says it is okay.  You may need to stay home longer to avoid giving your URI to others.  You can also wear a face mask and wash your hands often to prevent spread of the virus.  Use your inhaler more if you have asthma.  Do not use any tobacco products, including cigarettes, chewing tobacco, or electronic cigarettes. If you need help quitting, ask your doctor. GET HELP IF:  You are getting worse, not better.  Your symptoms are not helped by medicine.  You have chills.  You are getting more short of breath.  You have brown or red mucus.  You have yellow or brown discharge from your nose.  You have pain in your face, especially when you bend forward.  You have a fever.  You have puffy (swollen) neck glands.  You have pain while swallowing.  You have white areas in the back of your throat. GET HELP RIGHT AWAY IF:   You have  very bad or constant:  Headache.  Ear pain.  Pain in your forehead, behind your eyes, and over your cheekbones (sinus pain).  Chest pain.  You have long-lasting (chronic) lung disease and any of the following:  Wheezing.  Long-lasting cough.  Coughing up blood.  A change in your usual mucus.  You have a stiff neck.  You have changes in your:  Vision.  Hearing.  Thinking.  Mood. MAKE SURE YOU:   Understand these instructions.  Will watch your condition.  Will get help right away if you are not doing well or get worse.   This information is not intended to replace advice given to you by your health care provider. Make sure you discuss any questions you have with your health care provider.   Document Released: 03/22/2008 Document Revised: 02/18/2015 Document Reviewed: 01/09/2014 Elsevier Interactive Patient Education [employer].

## 2016-01-14 NOTE — ED Notes (Signed)
Pt reports cough and nasal congestion x 3-4 days. Denies fevers, body aches, n/v/d.

## 2016-01-14 NOTE — ED Provider Notes (Signed)
CSN: [id number]     Arrival date & time 01/14/16  1132 History   First MD Initiated Contact with Patient 01/14/16 1218     Chief Complaint  Patient presents with  . Nasal Congestion     (Consider location/radiation/quality/duration/timing/severity/associated sxs/prior Treatment) Patient is a 35 y.o. male presenting with URI. The history is provided by the patient.  URI Presenting symptoms: congestion, cough and sore throat   Presenting symptoms: no fever   Severity:  Moderate Onset quality:  Gradual Duration:  4 days Timing:  Intermittent Progression:  Worsening Chronicity:  New Relieved by:  Nothing Ineffective treatments:  None tried Associated symptoms: headaches   Associated symptoms comment:  Sinus congestion wth blood mucus Risk factors: sick contacts   Risk factors: no immunosuppression and no recent travel     Past Medical History  Diagnosis Date  . Psoriasis   . Hypertension   . Substance abuse     methadone clinic  . Anemia   . Thrombocytopenia ([place])   . 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 Diastolic dysfunction 0000000.    Grade 2 per echo  . Headache     Migraines- due to headache  . Shortness of breath dyspnea     when I had a lot of fluid on  . GERD (gastroesophageal reflux disease)   . Constipation   . Migraines   . Dialysis patient ([place])   . Anxiety   . Arthritis   . Hepatitis 2016    hepatitis c  . Acute on chronic renal failure (HCC)     ESRD TThSAT  . CKD (chronic kidney disease)    Past Surgical History  Procedure Laterality Date  . Av fistula placement Left 06/18/2015    Procedure: LEFT RADIOCEPHALIC ARTERIOVENOUS (AV) FISTULA CREATION;  Surgeon: Elam Dutch, MD;  Location: [facility];  Service: Vascular;  Laterality: Left;  . Bascilic vein transposition Left 07/09/2015    Procedure: LEFT BRACHIOCEPHALIC ARTERIOVENOUS FISTULA;  Surgeon: Elam Dutch, MD;  Location: [facility];  Service: Vascular;  Laterality: Left;  .  Permacath Right   . Insertion of dialysis catheter N/A 09/03/2015    Procedure: INSERTION OF PERITONEAL DIALYSIS CATHETER;  Surgeon: Katha Cabal, MD;  Location: [facility];  Service: Vascular;  Laterality: N/A;  . Av fistula placement Left 12/12/2015    Procedure: INSERTION OF ARTERIOVENOUS (AV) GORE-TEX GRAFT ARm  ( BRACH/AXILLARY GRAFT );  Surgeon: Katha Cabal, MD;  Location: [facility];  Service: Vascular;  Laterality: Left;  . Capd insertion N/A 12/12/2015    Procedure: Removal of peritoneal dialysis catheter;  Surgeon: Katha Cabal, MD;  Location: [facility];  Service: Vascular;  Laterality: N/A;   Family History  Problem Relation Age of Onset  . Diabetes Mother   . Heart disease Father   . Hypertension Father    Social History  Substance Use Topics  . Smoking status: Current Every Day Smoker -- 1.00 packs/day for 10 years    Types: Cigarettes  . Smokeless tobacco: Never Used     Comment: 10 years  off and on  . Alcohol Use: No    Review of Systems  Constitutional: Negative for fever.  HENT: Positive for congestion and sore throat.   Respiratory: Positive for cough.   Neurological: Positive for headaches.  All other systems reviewed and are negative.     Allergies  Penicillins  Home Medications   Prior to Admission medications   Medication Sig Start Date End  Date Taking? Authorizing Provider  amLODipine (NORVASC) 5 MG tablet Take 5 mg by mouth daily.    Historical Provider, MD  aspirin 325 MG tablet Take 325-650 mg by mouth daily as needed for headache.    Historical Provider, MD  Buprenorphine HCl-Naloxone HCl (SUBOXONE) 12-3 MG FILM Place 1 Film under the tongue daily.    Historical Provider, MD  calcitRIOL (ROCALTROL) 0.5 MCG capsule Take 0.5 mcg by mouth daily.    Historical Provider, MD  furosemide (LASIX) 80 MG tablet Take 40 mg by mouth daily.     Historical Provider, MD  hydrALAZINE (APRESOLINE) 50 MG tablet Take 1 tablet (50 mg total) by mouth every  8 (eight) hours. 04/17/15   Radene Gunning, NP  HYDROcodone-acetaminophen (NORCO) 5-325 MG tablet Take 1-2 tablets by mouth every 6 (six) hours as needed for moderate pain or severe pain. 12/12/15   Katha Cabal, MD  labetalol (NORMODYNE) 300 MG tablet Take 1 tablet (300 mg total) by mouth 2 (two) times daily. 04/17/15   Radene Gunning, NP  Multiple Vitamin (MULTIVITAMIN WITH MINERALS) TABS tablet Take 1 tablet by mouth daily.    Historical Provider, MD  promethazine (PHENERGAN) 25 MG tablet Take 1 tablet (25 mg total) by mouth every 6 (six) hours as needed for nausea or vomiting. 04/17/15   Radene Gunning, NP  sevelamer carbonate (RENVELA) 800 MG tablet Take 800-1,600 mg by mouth 3 (three) times daily with meals. Take 2 tablets with meals, and 1 tablet with snacks    Historical Provider, MD   BP 132/72 mmHg  Pulse 86  Temp(Src) 98 F (36.7 C) (Oral)  Resp 18  Ht 6\' 2"  (1.88 m)  Wt 81.647 kg  BMI 23.10 kg/m2  SpO2 99% Physical Exam  Constitutional: He is oriented to person, place, and time. He appears well-developed and well-nourished.  Non-toxic appearance.  HENT:  Head: Normocephalic.  Right Ear: Tympanic membrane and external ear normal.  Left Ear: Tympanic membrane and external ear normal.  Nasal congestion present Click of TMJ  Eyes: EOM and lids are normal. Pupils are equal, round, and reactive to light.  Neck: Normal range of motion. Neck supple. Carotid bruit is not present.  Cardiovascular: Normal rate, regular rhythm, normal heart sounds, intact distal pulses and normal pulses.   Pulmonary/Chest: No respiratory distress. He has wheezes. He has rhonchi.  Abdominal: Soft. Bowel sounds are normal. There is no tenderness. There is no guarding.  Musculoskeletal: Normal range of motion.  Lymphadenopathy:       Head (right side): No submandibular adenopathy present.       Head (left side): No submandibular adenopathy present.    He has no cervical adenopathy.  Neurological: He is  alert and oriented to person, place, and time. He has normal strength. No cranial nerve deficit or sensory deficit.  Skin: Skin is warm and dry.  Psychiatric: He has a normal mood and affect. His speech is normal.  Nursing note and vitals reviewed.   ED Course  Procedures (including critical care time) Labs Review Labs Reviewed - No data to display  Imaging Review No results found. I have personally reviewed and evaluated these images and lab results as part of my medical decision-making.   EKG Interpretation None      MDM  The exam is consistent with URI. Pt in no distress. Pt has wheezes and rhonchi. Pt to be treated with decadron, albuterol and afrin. Pt will use tylenol and ibuprofen for fever  and aching.   Final diagnoses:  URI (upper respiratory infection)    **I have reviewed nursing notes, vital signs, and all appropriate lab and imaging results for this patient.Lily Kocher, PA-C 01/15/16 2040  Nat Christen, MD 01/16/16 [phone]

## 2016-01-20 ENCOUNTER — Ambulatory Visit: Admission: RE | Admit: 2016-01-20 | Payer: Medicaid Other | Source: Ambulatory Visit | Admitting: Vascular Surgery

## 2016-01-20 ENCOUNTER — Encounter: Admission: RE | Payer: Self-pay | Source: Ambulatory Visit

## 2016-01-20 SURGERY — DIALYSIS/PERMA CATHETER REMOVAL
Anesthesia: Moderate Sedation

## 2016-03-31 ENCOUNTER — Encounter (HOSPITAL_COMMUNITY): Payer: Self-pay | Admitting: Emergency Medicine

## 2016-03-31 ENCOUNTER — Emergency Department (HOSPITAL_COMMUNITY)
Admission: EM | Admit: 2016-03-31 | Discharge: 2016-03-31 | Disposition: A | Discharge status: Home / Self Care | Payer: Medicaid Other | Attending: Emergency Medicine | Admitting: Emergency Medicine

## 2016-03-31 DIAGNOSIS — Z79899 Other long term (current) drug therapy: Secondary | ICD-10-CM | POA: Insufficient documentation

## 2016-03-31 DIAGNOSIS — M1990 Unspecified osteoarthritis, unspecified site: Secondary | ICD-10-CM | POA: Diagnosis not present

## 2016-03-31 DIAGNOSIS — N189 Chronic kidney disease, unspecified: Secondary | ICD-10-CM | POA: Insufficient documentation

## 2016-03-31 DIAGNOSIS — Z7982 Long term (current) use of aspirin: Secondary | ICD-10-CM | POA: Diagnosis not present

## 2016-03-31 DIAGNOSIS — M545 Low back pain, unspecified: Secondary | ICD-10-CM

## 2016-03-31 DIAGNOSIS — F17210 Nicotine dependence, cigarettes, uncomplicated: Secondary | ICD-10-CM | POA: Insufficient documentation

## 2016-03-31 DIAGNOSIS — I129 Hypertensive chronic kidney disease with stage 1 through stage 4 chronic kidney disease, or unspecified chronic kidney disease: Secondary | ICD-10-CM | POA: Diagnosis not present

## 2016-03-31 LAB — URINE MICROSCOPIC-ADD ON

## 2016-03-31 LAB — URINALYSIS, ROUTINE W REFLEX MICROSCOPIC
BILIRUBIN URINE: NEGATIVE
Glucose, UA: NEGATIVE mg/dL
KETONES UR: NEGATIVE mg/dL
LEUKOCYTES UA: NEGATIVE
NITRITE: NEGATIVE
Specific Gravity, Urine: 1.02 (ref 1.005–1.030)
pH: 6 (ref 5.0–8.0)

## 2016-03-31 MED ORDER — CYCLOBENZAPRINE HCL 10 MG PO TABS: 10.0000 mg | ORAL_TABLET | Freq: Two times a day (BID) | ORAL | Status: DC | PRN

## 2016-03-31 MED ORDER — MORPHINE SULFATE (PF) 4 MG/ML IV SOLN
6.0000 mg | Freq: Once | INTRAVENOUS | Status: AC
  Administered 2016-03-31: 6 mg via INTRAMUSCULAR
  Filled 2016-03-31: qty 2

## 2016-03-31 MED ORDER — ACETAMINOPHEN 500 MG PO TABS
1000.0000 mg | ORAL_TABLET | Freq: Once | ORAL | Status: AC
  Administered 2016-03-31: 1000 mg via ORAL
  Filled 2016-03-31: qty 2

## 2016-03-31 MED ORDER — CYCLOBENZAPRINE HCL 10 MG PO TABS
10.0000 mg | ORAL_TABLET | Freq: Once | ORAL | Status: AC
  Administered 2016-03-31: 10 mg via ORAL
  Filled 2016-03-31: qty 1

## 2016-03-31 MED ORDER — LIDOCAINE 5 % EX PTCH: 1.0000 | MEDICATED_PATCH | CUTANEOUS | Status: DC

## 2016-03-31 MED ORDER — DIAZEPAM 5 MG PO TABS
10.0000 mg | ORAL_TABLET | Freq: Once | ORAL | Status: DC
  Filled 2016-03-31: qty 2

## 2016-03-31 NOTE — Discharge Instructions (Signed)
If you were given medicines take as directed.  If you are on coumadin or contraceptives realize their levels and effectiveness is altered by many different medicines.  If you have any reaction (rash, tongues swelling, other) to the medicines stop taking and see a physician.    If your blood pressure was elevated in the ER make sure you follow up for management with a primary doctor or return for chest pain, shortness of breath or stroke symptoms.  Please follow up as directed and return to the ER or see a physician for new or worsening symptoms.  Thank you. Filed Vitals:   03/31/16 0941  BP: 138/95  Pulse: 80  Temp: 97 F (36.1 C)  TempSrc: Oral  Resp: 18  Height: 6\' 2"  (1.88 m)  Weight: 180 lb (81.647 kg)  SpO2: 100%

## 2016-03-31 NOTE — ED Notes (Signed)
Pt reports lower back pain since yesterday. Pt reports has been heavy lifting for last several days. Pt denies gi/gu symptoms. Pt reports pain is worse with movement.

## 2016-03-31 NOTE — ED Provider Notes (Signed)
History  By signing my name below, I, Bea Graff, attest that this documentation has been prepared under the direction and in the presence of Elnora Morrison, MD. Electronically Signed: Bea Graff, ED Scribe. 03/31/2016. 10:22 AM.  Chief Complaint  Patient presents with  . Back Pain   The history is provided by the patient and medical records. No language interpreter was used.    HPI Comments:  Terrance Reynolds is a 35 y.o. male dialysis patient with CKD with PMHx of HTN, Hepatitis C and substance abuse who presents to the Emergency Department complaining of sharp, gradual onset, right-sided lower back pain that began yesterday morning. He states the pain is more movement-related but he has intermittent sharp pain at rest as well. He states the pain can sometimes radiates to his upper right buttock. Pt states he was helping a friend do some roofing work two days ago and may have hurt it then. He has not done anything to treat his pain. Movement increases his pain. He denies alleviating factors. He denies numbness, tingling or weakness of the lower extremities, abdominal pain, nausea, vomiting, difficulty urinating. He denies h/o kidney stones. He reports h/o IV drug abuse several years ago. Pt states it has been "a while" since he dialyzed last but reports his doctor believes he is regaining some function of his kidneys.  Past Medical History  Diagnosis Date  . Psoriasis   . Hypertension   . Substance abuse     methadone clinic  . Anemia   . Thrombocytopenia ([place])   . 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 Diastolic dysfunction 0000000.    Grade 2 per echo  . Headache     Migraines- due to headache  . Shortness of breath dyspnea     when I had a lot of fluid on  . GERD (gastroesophageal reflux disease)   . Constipation   . Migraines   . Dialysis patient ([place])   . Anxiety   . Arthritis   . Hepatitis 2016    hepatitis c  . Acute on chronic renal failure (HCC)      ESRD TThSAT  . CKD (chronic kidney disease)    Past Surgical History  Procedure Laterality Date  . Av fistula placement Left 06/18/2015    Procedure: LEFT RADIOCEPHALIC ARTERIOVENOUS (AV) FISTULA CREATION;  Surgeon: Elam Dutch, MD;  Location: [place];  Service: Vascular;  Laterality: Left;  . Bascilic vein transposition Left 07/09/2015    Procedure: LEFT BRACHIOCEPHALIC ARTERIOVENOUS FISTULA;  Surgeon: Elam Dutch, MD;  Location: [place];  Service: Vascular;  Laterality: Left;  . Permacath Right   . Insertion of dialysis catheter N/A 09/03/2015    Procedure: INSERTION OF PERITONEAL DIALYSIS CATHETER;  Surgeon: Katha Cabal, MD;  Location: [facility];  Service: Vascular;  Laterality: N/A;  . Av fistula placement Left 12/12/2015    Procedure: INSERTION OF ARTERIOVENOUS (AV) GORE-TEX GRAFT [place]  ( BRACH/AXILLARY GRAFT );  Surgeon: Katha Cabal, MD;  Location: [facility];  Service: Vascular;  Laterality: Left;  . Capd insertion N/A 12/12/2015    Procedure: Removal of peritoneal dialysis catheter;  Surgeon: Katha Cabal, MD;  Location: [facility];  Service: Vascular;  Laterality: N/A;   Family History  Problem Relation Age of Onset  . Diabetes Mother   . Heart disease Father   . Hypertension Father    Social History  Substance Use Topics  . Smoking status: Current Every Day Smoker -- 1.00 packs/day  for 10 years    Types: Cigarettes  . Smokeless tobacco: Never Used     Comment: 10 years  off and on  . Alcohol Use: No    Review of Systems  Musculoskeletal: Positive for back pain.  All other systems reviewed and are negative.   Allergies  Penicillins  Home Medications   Prior to Admission medications   Medication Sig Start Date End Date Taking? Authorizing Provider  amLODipine (NORVASC) 5 MG tablet Take 5 mg by mouth daily.    Historical Provider, MD  aspirin 325 MG tablet Take 325-650 mg by mouth daily as needed for headache.    Historical Provider, MD   Aspirin-Caffeine 845-65 MG PACK Take 1 Package by mouth daily as needed (cold).    Historical Provider, MD  Buprenorphine HCl-Naloxone HCl (SUBOXONE) 12-3 MG FILM Place 1 Film under the tongue daily.    Historical Provider, MD  calcitRIOL (ROCALTROL) 0.5 MCG capsule Take 0.5 mcg by mouth daily.    Historical Provider, MD  cyclobenzaprine (FLEXERIL) 10 MG tablet Take 1 tablet (10 mg total) by mouth 2 (two) times daily as needed for muscle spasms. 03/31/16   Elnora Morrison, MD  dexamethasone (DECADRON) 4 MG tablet Take 1 tablet (4 mg total) by mouth 2 (two) times daily with a meal. 01/14/16   Lily Kocher, PA-C  furosemide (LASIX) 80 MG tablet Take 80 mg by mouth daily.     Historical Provider, MD  hydrALAZINE (APRESOLINE) 50 MG tablet Take 1 tablet (50 mg total) by mouth every 8 (eight) hours. 04/17/15   Radene Gunning, NP  HYDROcodone-acetaminophen (NORCO) 5-325 MG tablet Take 1-2 tablets by mouth every 6 (six) hours as needed for moderate pain or severe pain. Patient not taking: Reported on 01/14/2016 12/12/15   Katha Cabal, MD  labetalol (NORMODYNE) 300 MG tablet Take 1 tablet (300 mg total) by mouth 2 (two) times daily. 04/17/15   Radene Gunning, NP  lidocaine (LIDODERM) 5 % Place 1 patch onto the skin daily. Remove & Discard patch within 12 hours or as directed by MD 03/31/16   Elnora Morrison, MD  Multiple Vitamin (MULTIVITAMIN WITH MINERALS) TABS tablet Take 1 tablet by mouth daily.    Historical Provider, MD  oxymetazoline (AFRIN NASAL SPRAY) 0.05 % nasal spray Place 1 spray into both nostrils 2 (two) times daily. Use for 5 days only. 01/14/16   Lily Kocher, PA-C  sevelamer carbonate (RENVELA) 800 MG tablet Take 800-1,600 mg by mouth 3 (three) times daily with meals. Take 2 tablets with meals, and 1 tablet with snacks    Historical Provider, MD   Triage Vitals: BP 138/95 mmHg  Pulse 80  Temp(Src) 97 F (36.1 C) (Oral)  Resp 18  Ht 6\' 2"  (1.88 m)  Wt 180 lb (81.647 kg)  BMI 23.10 kg/m2   SpO2 100% Physical Exam  Constitutional: He is oriented to person, place, and time. He appears well-developed and well-nourished.  HENT:  Head: Normocephalic and atraumatic.  Eyes: EOM are normal.  Neck: Normal range of motion.  Cardiovascular: Normal rate.   Pulmonary/Chest: Effort normal.  Abdominal: Soft. There is no tenderness.  Musculoskeletal: Normal range of motion. He exhibits tenderness.  Pt uncomfortable with movement. Right paraspinal tenderness to lower lumbar and sacral region. No midline tenderness.  Neurological: He is alert and oriented to person, place, and time.  Reflex Scores:      Patellar reflexes are 2+ on the right side and 2+ on the left side.  Achilles reflexes are 2+ on the right side and 2+ on the left side. Strength 5+ with flexion and extension of BLE and great toes. Sensations intact.  Skin: Skin is warm and dry.  Psychiatric: He has a normal mood and affect. His behavior is normal.  Nursing note and vitals reviewed.   ED Course  Procedures (including critical care time) Emergency Focused Ultrasound Exam Limited retroperitoneal ultrasound of kidneys  Performed and interpreted by Dr. Reather Converse Indication: flank pain Focused abdominal ultrasound with both kidneys imaged in transverse and longitudinal planes in real-time. Interpretation: no hydronephrosis visualized.   Images archived electronically  CPT Code: [phone] (limited retroperitoneal)  DIAGNOSTIC STUDIES: Oxygen Saturation is 100% on RA, normal by my interpretation.   COORDINATION OF CARE: 10:16 AM- Will order urinalysis and pain medication. Will bedside ultrasound right kidney. Pt verbalizes understanding and agrees to plan.  Medications  acetaminophen (TYLENOL) tablet 1,000 mg (1,000 mg Oral Given 03/31/16 1022)  cyclobenzaprine (FLEXERIL) tablet 10 mg (10 mg Oral Given 03/31/16 1022)  morphine 4 MG/ML injection 6 mg (6 mg Intramuscular Given 03/31/16 1022)    Labs Review Labs  Reviewed  URINALYSIS, ROUTINE W REFLEX MICROSCOPIC (NOT AT [facility])    Imaging Review No results found. I have personally reviewed and evaluated these images and lab results as part of my medical decision-making.   EKG Interpretation None      MDM   Final diagnoses:  Right-sided low back pain without sciatica   Patient presents ht flank pain worse with movement. Kidney stone on differential.  Unable to use NSAIDS as pt still produces urine on dialysis.  Clinically musculoskeletal. No fevers, no midline tenderness no current IV drug abuse. Normal neurologic exam. Pain meds and muscle relaxants in the ER. Discussed outpatient follow-up.  Back spasm on reassessment, valium ordered. Outpt fup.   No hydro on [place].   Results and differential diagnosis were discussed with the patient/parent/guardian. Xrays were independently reviewed by myself.  Close follow up outpatient was discussed, comfortable with the plan.   Medications  acetaminophen (TYLENOL) tablet 1,000 mg (1,000 mg Oral Given 03/31/16 1022)  cyclobenzaprine (FLEXERIL) tablet 10 mg (10 mg Oral Given 03/31/16 1022)  morphine 4 MG/ML injection 6 mg (6 mg Intramuscular Given 03/31/16 1022)    Filed Vitals:   03/31/16 0941  BP: 138/95  Pulse: 80  Temp: 97 F (36.1 C)  TempSrc: Oral  Resp: 18  Height: 6\' 2"  (1.88 m)  Weight: 180 lb (81.647 kg)  SpO2: 100%    Final diagnoses:  Right-sided low back pain without sciatica      Elnora Morrison, MD 03/31/16 1215

## 2016-03-31 NOTE — ED Notes (Signed)
Pt made aware a urine specimen was needed. Pt verbalized understanding and will notify staff when one can be obtained.   

## 2016-04-13 ENCOUNTER — Emergency Department (HOSPITAL_COMMUNITY): Payer: Medicaid Other

## 2016-04-13 ENCOUNTER — Encounter (HOSPITAL_COMMUNITY): Payer: Self-pay | Admitting: Emergency Medicine

## 2016-04-13 ENCOUNTER — Emergency Department (HOSPITAL_COMMUNITY)
Admission: EM | Admit: 2016-04-13 | Discharge: 2016-04-13 | Disposition: A | Discharge status: Home / Self Care | Payer: Medicaid Other | Attending: Emergency Medicine | Admitting: Emergency Medicine

## 2016-04-13 DIAGNOSIS — I1310 Hypertensive heart and chronic kidney disease without heart failure, with stage 1 through stage 4 chronic kidney disease, or unspecified chronic kidney disease: Secondary | ICD-10-CM | POA: Diagnosis not present

## 2016-04-13 DIAGNOSIS — F17210 Nicotine dependence, cigarettes, uncomplicated: Secondary | ICD-10-CM | POA: Diagnosis not present

## 2016-04-13 DIAGNOSIS — M545 Low back pain, unspecified: Secondary | ICD-10-CM

## 2016-04-13 DIAGNOSIS — Z7982 Long term (current) use of aspirin: Secondary | ICD-10-CM | POA: Insufficient documentation

## 2016-04-13 DIAGNOSIS — M1990 Unspecified osteoarthritis, unspecified site: Secondary | ICD-10-CM | POA: Insufficient documentation

## 2016-04-13 DIAGNOSIS — I509 Heart failure, unspecified: Secondary | ICD-10-CM | POA: Insufficient documentation

## 2016-04-13 DIAGNOSIS — N189 Chronic kidney disease, unspecified: Secondary | ICD-10-CM | POA: Diagnosis not present

## 2016-04-13 DIAGNOSIS — Z79899 Other long term (current) drug therapy: Secondary | ICD-10-CM | POA: Diagnosis not present

## 2016-04-13 DIAGNOSIS — R52 Pain, unspecified: Secondary | ICD-10-CM

## 2016-04-13 MED ORDER — TRAMADOL HCL 50 MG PO TABS: 50.0000 mg | ORAL_TABLET | Freq: Four times a day (QID) | ORAL | Status: DC | PRN

## 2016-04-13 MED ORDER — HYDROMORPHONE HCL 2 MG/ML IJ SOLN
2.0000 mg | Freq: Once | INTRAMUSCULAR | Status: AC
  Administered 2016-04-13: 2 mg via INTRAMUSCULAR
  Filled 2016-04-13: qty 1

## 2016-04-13 NOTE — ED Notes (Signed)
Pt c/o right lower back and hip pain x 1 month. Pt also reports dull numbness to lle from knee to toes. Pt denies injury. States he was seen in ED x 3 weeks ago but sx are worse. Pt ambulated to room with steady gait. dp pulses 2+. C/m/s intact. nad noted.

## 2016-04-13 NOTE — ED Provider Notes (Signed)
CSN: [id number]     Arrival date & time 04/13/16  1118 History  By signing my name below, I, Eustaquio Maize, attest that this documentation has been prepared under the direction and in the presence of Milton Ferguson, MD. Electronically Signed: Eustaquio Maize, ED Scribe. 04/13/2016. 12:20 PM.   Chief Complaint  Patient presents with  . Back Pain   Patient is a 35 y.o. male presenting with back pain. The history is provided by the patient. No language interpreter was used.  Back Pain Location:  Lumbar spine Radiates to:  Does not radiate Pain severity:  Moderate Pain is:  Same all the time Onset quality:  Gradual Duration:  1 month Timing:  Constant Progression:  Worsening Chronicity:  New Associated symptoms: numbness   Associated symptoms: no abdominal pain, no chest pain and no headaches     HPI Comments: Terrance Reynolds is a 35 y.o. male who presents to the Emergency Department complaining of gradual onset, constant, right lower back pain that began approximately 1 month ago. He also complains of numbness from his left knee radiating down to his foot. Pt was seen in the ED on 03/31/2016 (approxiamtely 2 weeks ago) for the same symptoms. He states that he had a renal US done at that time with no acute findings. Pt returns to the ED today for worsening pain to his back. He is able to ambulate. Denies any other associated symptoms.   Per chart review: Pt was seen in the ED on 03/31/2016 for sharp, gradual onset, right sided lower back pain.  He had a UA done but no imaging. He was given a prescription for Flexeril and 5% Lidoderm and told to follow up outpatient.   Past Medical History  Diagnosis Date  . Psoriasis   . Hypertension   . Substance abuse     methadone clinic  . Anemia   . Thrombocytopenia ([place])   . 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 Diastolic dysfunction 0000000.    Grade 2 per echo  . Headache     Migraines- due to headache  . Shortness of breath  dyspnea     when I had a lot of fluid on  . GERD (gastroesophageal reflux disease)   . Constipation   . Migraines   . Dialysis patient ([place])   . Anxiety   . Arthritis   . Hepatitis 2016    hepatitis c  . Acute on chronic renal failure (HCC)     ESRD TThSAT  . CKD (chronic kidney disease)    Past Surgical History  Procedure Laterality Date  . Av fistula placement Left 06/18/2015    Procedure: LEFT RADIOCEPHALIC ARTERIOVENOUS (AV) FISTULA CREATION;  Surgeon: Elam Dutch, MD;  Location: [place];  Service: Vascular;  Laterality: Left;  . Bascilic vein transposition Left 07/09/2015    Procedure: LEFT BRACHIOCEPHALIC ARTERIOVENOUS FISTULA;  Surgeon: Elam Dutch, MD;  Location: [place];  Service: Vascular;  Laterality: Left;  . Permacath Right   . Insertion of dialysis catheter N/A 09/03/2015    Procedure: INSERTION OF PERITONEAL DIALYSIS CATHETER;  Surgeon: Katha Cabal, MD;  Location: [facility];  Service: Vascular;  Laterality: N/A;  . Av fistula placement Left 12/12/2015    Procedure: INSERTION OF ARTERIOVENOUS (AV) GORE-TEX GRAFT ARm  ( BRACH/AXILLARY GRAFT );  Surgeon: Katha Cabal, MD;  Location: [facility];  Service: Vascular;  Laterality: Left;  . Capd insertion N/A 12/12/2015    Procedure: Removal of peritoneal  dialysis catheter;  Surgeon: Katha Cabal, MD;  Location: [facility];  Service: Vascular;  Laterality: N/A;   Family History  Problem Relation Age of Onset  . Diabetes Mother   . Heart disease Father   . Hypertension Father    Social History  Substance Use Topics  . Smoking status: Current Every Day Smoker -- 1.00 packs/day for 10 years    Types: Cigarettes  . Smokeless tobacco: Never Used     Comment: 10 years  off and on  . Alcohol Use: No    Review of Systems  Constitutional: Negative for appetite change and fatigue.  HENT: Negative for congestion, ear discharge and sinus pressure.   Eyes: Negative for discharge.  Respiratory: Negative for cough.    Cardiovascular: Negative for chest pain.  Gastrointestinal: Negative for abdominal pain and diarrhea.  Genitourinary: Negative for frequency and hematuria.  Musculoskeletal: Positive for back pain.  Skin: Negative for rash.  Neurological: Positive for numbness. Negative for seizures and headaches.  Psychiatric/Behavioral: Negative for hallucinations.      Allergies  Penicillins  Home Medications   Prior to Admission medications   Medication Sig Start Date End Date Taking? Authorizing Provider  amLODipine (NORVASC) 5 MG tablet Take 5 mg by mouth daily.   Yes Historical Provider, MD  aspirin 325 MG tablet Take 325-650 mg by mouth daily as needed for headache.   Yes Historical Provider, MD  calcitRIOL (ROCALTROL) 0.5 MCG capsule Take 0.5 mcg by mouth daily.   Yes Historical Provider, MD  furosemide (LASIX) 80 MG tablet Take 80 mg by mouth daily.    Yes Historical Provider, MD  hydrALAZINE (APRESOLINE) 50 MG tablet Take 1 tablet (50 mg total) by mouth every 8 (eight) hours. 04/17/15  Yes Lezlie Octave Black, NP  labetalol (NORMODYNE) 300 MG tablet Take 1 tablet (300 mg total) by mouth 2 (two) times daily. 04/17/15  Yes Radene Gunning, NP  Multiple Vitamin (MULTIVITAMIN WITH MINERALS) TABS tablet Take 1 tablet by mouth daily.   Yes Historical Provider, MD  naproxen sodium (ANAPROX) 220 MG tablet Take 440 mg by mouth daily as needed (pain).   Yes Historical Provider, MD  PRESCRIPTION MEDICATION Take 60 mg by mouth daily. Methadone   Yes Historical Provider, MD  sevelamer carbonate (RENVELA) 800 MG tablet Take 800-1,600 mg by mouth 3 (three) times daily with meals. Take 2 tablets with meals, and 1 tablet with snacks   Yes Historical Provider, MD  triamcinolone cream (KENALOG) 0.1 % Apply 1 application topically 3 (three) times a week.   Yes Historical Provider, MD   BP 165/103 mmHg  Pulse 90  Temp(Src) 98.3 F (36.8 C) (Oral)  Resp 16  Ht 6\' 2"  (1.88 m)  Wt 180 lb (81.647 kg)  BMI 23.10 kg/m2   SpO2 100% Physical Exam  Constitutional: He is oriented to person, place, and time. He appears well-developed.  HENT:  Head: Normocephalic.  Eyes: Conjunctivae and EOM are normal. No scleral icterus.  Neck: Neck supple. No thyromegaly present.  Cardiovascular: Normal rate and regular rhythm.  Exam reveals no gallop and no friction rub.   No murmur heard. Pulmonary/Chest: No stridor. He has no wheezes. He has no rales. He exhibits no tenderness.  Abdominal: He exhibits no distension. There is no tenderness. There is no rebound.  Musculoskeletal: Normal range of motion. He exhibits tenderness. He exhibits no edema.  Tenderness to lumbar spine. Pain with raising both legs.   Lymphadenopathy:    He has  no cervical adenopathy.  Neurological: He is oriented to person, place, and time. He exhibits normal muscle tone. Coordination normal.  Skin: No rash noted. No erythema.  Psychiatric: He has a normal mood and affect. His behavior is normal.    ED Course  Procedures (including critical care time)  DIAGNOSTIC STUDIES: Oxygen Saturation is 100% on RA, normal by my interpretation.    COORDINATION OF CARE: 12:16 PM-Discussed treatment plan with pt at bedside and pt agreed to plan.   Labs Review Labs Reviewed - No data to display  Imaging Review No results found. I have personally reviewed and evaluated these images and lab results as part of my medical decision-making.   EKG Interpretation None      MDM   Final diagnoses:  None    Patient states that he has an appointment with his nephrologist. He would like to leave and go to that appointment and come back the results of his MRI. Patient was given a prescription of Vicodin will follow-up his PCP for his symptoms. He may return to get results today     Milton Ferguson, MD 04/13/16 1331

## 2016-04-13 NOTE — Discharge Instructions (Signed)
Follow up with your family md  °

## 2016-04-13 NOTE — ED Notes (Addendum)
Pt states his back has been hurting for over a month.  States he was here for the same thing 3 weeks ago and is now experiencing numbness in left leg.  Pt states he has not had dialysis in over a month.

## 2016-04-19 ENCOUNTER — Other Ambulatory Visit: Payer: Self-pay | Admitting: Vascular Surgery

## 2016-04-23 ENCOUNTER — Other Ambulatory Visit
Admission: RE | Admit: 2016-04-23 | Discharge: 2016-04-23 | Disposition: A | Discharge status: Home / Self Care | Payer: Medicaid Other | Source: Ambulatory Visit | Attending: Vascular Surgery | Admitting: Vascular Surgery

## 2016-04-23 ENCOUNTER — Ambulatory Visit
Admission: RE | Admit: 2016-04-23 | Discharge: 2016-04-23 | Disposition: A | Discharge status: Home / Self Care | Payer: Medicaid Other | Source: Ambulatory Visit | Attending: Vascular Surgery | Admitting: Vascular Surgery

## 2016-04-23 ENCOUNTER — Encounter
Admission: RE | Disposition: A | Discharge status: Home / Self Care | Payer: Self-pay | Source: Ambulatory Visit | Attending: Vascular Surgery

## 2016-04-23 ENCOUNTER — Ambulatory Visit: Admit: 2016-04-23 | Payer: Self-pay | Admitting: Vascular Surgery

## 2016-04-23 DIAGNOSIS — Z992 Dependence on renal dialysis: Secondary | ICD-10-CM | POA: Insufficient documentation

## 2016-04-23 DIAGNOSIS — N186 End stage renal disease: Secondary | ICD-10-CM | POA: Diagnosis not present

## 2016-04-23 DIAGNOSIS — Z880 Allergy status to penicillin: Secondary | ICD-10-CM | POA: Diagnosis not present

## 2016-04-23 DIAGNOSIS — T82898A Other specified complication of vascular prosthetic devices, implants and grafts, initial encounter: Secondary | ICD-10-CM | POA: Diagnosis not present

## 2016-04-23 DIAGNOSIS — M2550 Pain in unspecified joint: Secondary | ICD-10-CM | POA: Insufficient documentation

## 2016-04-23 DIAGNOSIS — Z823 Family history of stroke: Secondary | ICD-10-CM | POA: Diagnosis not present

## 2016-04-23 DIAGNOSIS — Z833 Family history of diabetes mellitus: Secondary | ICD-10-CM | POA: Diagnosis not present

## 2016-04-23 DIAGNOSIS — Y832 Surgical operation with anastomosis, bypass or graft as the cause of abnormal reaction of the patient, or of later complication, without mention of misadventure at the time of the procedure: Secondary | ICD-10-CM | POA: Diagnosis not present

## 2016-04-23 DIAGNOSIS — I499 Cardiac arrhythmia, unspecified: Secondary | ICD-10-CM | POA: Diagnosis not present

## 2016-04-23 DIAGNOSIS — Z8249 Family history of ischemic heart disease and other diseases of the circulatory system: Secondary | ICD-10-CM | POA: Diagnosis not present

## 2016-04-23 DIAGNOSIS — R200 Anesthesia of skin: Secondary | ICD-10-CM | POA: Insufficient documentation

## 2016-04-23 DIAGNOSIS — I120 Hypertensive chronic kidney disease with stage 5 chronic kidney disease or end stage renal disease: Secondary | ICD-10-CM | POA: Insufficient documentation

## 2016-04-23 DIAGNOSIS — F17200 Nicotine dependence, unspecified, uncomplicated: Secondary | ICD-10-CM | POA: Diagnosis not present

## 2016-04-23 DIAGNOSIS — R531 Weakness: Secondary | ICD-10-CM | POA: Diagnosis not present

## 2016-04-23 HISTORY — PX: PERIPHERAL VASCULAR CATHETERIZATION: SHX172C

## 2016-04-23 LAB — POTASSIUM: POTASSIUM: 4.1 mmol/L (ref 3.5–5.1)

## 2016-04-23 LAB — POTASSIUM (ARMC VASCULAR LAB ONLY): POTASSIUM (ARMC VASCULAR LAB): 4.1 (ref 3.5–5.1)

## 2016-04-23 SURGERY — A/V SHUNTOGRAM/FISTULAGRAM
Anesthesia: Moderate Sedation

## 2016-04-23 SURGERY — UPPER EXTREMITY ANGIOGRAPHY
Anesthesia: Moderate Sedation | Site: Arm Upper | Laterality: Left

## 2016-04-23 MED ORDER — MIDAZOLAM HCL 2 MG/2ML IJ SOLN
INTRAMUSCULAR | Status: AC
  Filled 2016-04-23: qty 2

## 2016-04-23 MED ORDER — MIDAZOLAM HCL 5 MG/ML IJ SOLN
INTRAMUSCULAR | Status: DC | PRN
Start: 2016-04-23 — End: 2016-04-23
  Administered 2016-04-23: 1 mg via INTRAVENOUS

## 2016-04-23 MED ORDER — FENTANYL CITRATE (PF) 100 MCG/2ML IJ SOLN
INTRAMUSCULAR | Status: AC
  Filled 2016-04-23: qty 2

## 2016-04-23 MED ORDER — OXYCODONE-ACETAMINOPHEN 5-325 MG PO TABS: 1.0000 | ORAL_TABLET | Freq: Three times a day (TID) | ORAL | Status: DC | PRN

## 2016-04-23 MED ORDER — LIDOCAINE HCL (PF) 1 % IJ SOLN
INTRAMUSCULAR | Status: AC
  Filled 2016-04-23: qty 30

## 2016-04-23 MED ORDER — CLINDAMYCIN PHOSPHATE 300 MG/50ML IV SOLN
INTRAVENOUS | Status: DC
Start: 2016-04-23 — End: 2016-04-23
  Filled 2016-04-23: qty 50

## 2016-04-23 MED ORDER — HYDROMORPHONE HCL 1 MG/ML IJ SOLN: 1.0000 mg | Freq: Once | INTRAMUSCULAR | Status: DC

## 2016-04-23 MED ORDER — SODIUM CHLORIDE 0.9 % IV SOLN: INTRAVENOUS | Status: DC

## 2016-04-23 MED ORDER — FENTANYL CITRATE (PF) 100 MCG/2ML IJ SOLN
INTRAMUSCULAR | Status: DC | PRN
  Administered 2016-04-23: 25 ug via INTRAVENOUS
  Administered 2016-04-23 (×3): 50 ug via INTRAVENOUS

## 2016-04-23 MED ORDER — ONDANSETRON HCL 4 MG/2ML IJ SOLN: 4.0000 mg | Freq: Four times a day (QID) | INTRAMUSCULAR | Status: DC | PRN

## 2016-04-23 MED ORDER — MIDAZOLAM HCL 5 MG/ML IJ SOLN
INTRAMUSCULAR | Status: DC | PRN
Start: 2016-04-23 — End: 2016-04-23
  Administered 2016-04-23: 1 mg via INTRAVENOUS
  Administered 2016-04-23: 2 mg via INTRAVENOUS

## 2016-04-23 MED ORDER — FAMOTIDINE 20 MG PO TABS: 40.0000 mg | ORAL_TABLET | ORAL | Status: DC | PRN

## 2016-04-23 MED ORDER — HEPARIN SODIUM (PORCINE) 1000 UNIT/ML IJ SOLN
INTRAMUSCULAR | Status: AC
  Filled 2016-04-23: qty 1

## 2016-04-23 MED ORDER — MIDAZOLAM HCL 5 MG/5ML IJ SOLN
INTRAMUSCULAR | Status: AC
  Filled 2016-04-23: qty 5

## 2016-04-23 MED ORDER — MIDAZOLAM HCL 2 MG/2ML IJ SOLN
INTRAMUSCULAR | Status: DC | PRN
  Administered 2016-04-23: 1 mg via INTRAVENOUS

## 2016-04-23 MED ORDER — IOPAMIDOL (ISOVUE-300) INJECTION 61%
INTRAVENOUS | Status: DC | PRN
  Administered 2016-04-23: 20 mL via INTRAVENOUS

## 2016-04-23 MED ORDER — CLINDAMYCIN PHOSPHATE 300 MG/50ML IV SOLN
300.0000 mg | Freq: Once | INTRAVENOUS | Status: DC
Start: 2016-04-23 — End: 2016-04-23

## 2016-04-23 MED ORDER — METHYLPREDNISOLONE SODIUM SUCC 125 MG IJ SOLR: 125.0000 mg | INTRAMUSCULAR | Status: DC | PRN

## 2016-04-23 MED ORDER — FENTANYL CITRATE (PF) 100 MCG/2ML IJ SOLN
INTRAMUSCULAR | Status: AC
  Filled 2016-04-23: qty 4

## 2016-04-23 SURGICAL SUPPLY — 7 items
DRAPE BRACHIAL (DRAPES) ×4 IMPLANT
GLIDECATH ANGL TAPER 5F (CATHETERS) ×4 IMPLANT
GUIDEWIRE ANGLED .035 180CM (WIRE) ×4 IMPLANT
PACK ANGIOGRAPHY (CUSTOM PROCEDURE TRAY) ×4 IMPLANT
SET INTRO CAPELLA COAXIAL (SET/KITS/TRAYS/PACK) ×4 IMPLANT
SHEATH BRITE TIP 6FRX5.5 (SHEATH) ×4 IMPLANT
TOWEL OR 17X26 4PK STRL BLUE (TOWEL DISPOSABLE) ×4 IMPLANT

## 2016-04-23 NOTE — Discharge Instructions (Signed)
Keep LEFT ARM elevated on pillow above the heart for today. The drugs you were given will stay in your system until tomorrow, so for the next 24 hours you should not.  Drive an automobile. Make any legal decisions.  Drink any alcoholic beverages.  Today you should start with liquids and gradually work up to solid foods as your are able to tolerate them  Resume your regular medications as prescribed by your doctor.  Avoid aspirin for 24 hours.    Change the Band-Aid or dressing as needed.  After a 2 days no dressing as needed.  Avoid strenuous activity for the remainder of the day.  Please notify your primary physician immediately if you have any unusual bleeding, trouble breathing, fever >100 degrees or pain not relieved by the medication your doctor prescribed for your doctor prescribed for you physician  Return to diaslysis  tommorow.

## 2016-04-23 NOTE — Op Note (Signed)
OPERATIVE NOTE   PROCEDURE: 1. Contrast injection left arm brachial axillary dialysis graft 2. Aspiration of hematoma left arm  PRE-OPERATIVE DIAGNOSIS: Complication of dialysis access with hematoma status post failed cannulation                                                       End Stage Renal Disease  POST-OPERATIVE DIAGNOSIS: same as above; subtotal occlusion left innominate vein   SURGEON: Katha Cabal, M.D.  ANESTHESIA: Conscious sedation was administered under my direct supervision. IV Versed plus fentanyl were utilized. Continuous ECG, pulse oximetry and blood pressure was monitored throughout the entire procedure. Versed and fentanyl were utilized.  Conscious sedation was for a total of 60 minutes.  ESTIMATED BLOOD LOSS: minimal  FINDING(S): 1. The area near the arterial anastomosis is a hematoma no frank blood or purulence was identified.  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 male who  presents with thrombosed AV access associated with 48 hours of increasing pain and swelling of the entire arm. Also, after an attempted cannulation last week he has developed enlargement or aneurysmal area overlying the arterial anastomosis. The patient is scheduled for angiography with possible intervention of the AV access.  The patient is aware the risks include but are not limited to: bleeding, infection, thrombosis of the cannulated access, and possible anaphylactic reaction to the contrast.  The patient acknowledges if the access can not be salvaged a tunneled catheter will be needed and will be placed during this procedure.  The patient is aware of the risks of the procedure and elects to proceed with the angiogram and intervention.  DESCRIPTION: After full informed written consent was obtained, the patient was brought back to the Special  Procedure suite and placed supine position.  Appropriate cardiopulmonary monitors were placed.  The left arm was prepped and draped in the standard fashion.  Appropriate timeout is called. The left brachial axillary graft  was cannulated with a micropuncture needle under ultrasound guidance. The graft is visualized with ultrasound it is noted to be filled with heterogeneous material consistent with thrombosis. The puncture is made under direct visualization with the tip of the needle identified and the central portion of the graft.  The microwire was advanced and the needle was exchanged for  a microsheath.  The J-wire was then advanced and a 6 Fr sheath inserted.  Hand injections were completed to image the access.  The graft itself is confirmed to be thrombosed. Floppy Glidewire and Kumpe catheter then negotiated into the axillary vein and hand injection of contrast is used to demonstrate the axillary and subclavian veins which are widely patent. The innominate vein demonstrates a subtotal occlusion this is imaged after the Kumpe is manipulated more centrally to get better views. It is also imaged in several different angles. Superior vena cava is patent.  Pursestring suture was then placed around the sheath and the sheath was removed without difficulty  Based on the images,  given the severity of the central subtotal occlusion I am concerned that this potentially could be an extrinsic cause. The patient is a young man who is not been on dialysis for very long he has not  had multiple grafts and indeed this is his first catheter which is on the right side not the left side. These factors make me very suspicious as to the cause of his left innominate stenosis. Clearly however, this is the cause of his arm swelling and pain. Given these facts I will plan not to declot his graft today I will obtain a CT scan of the chest to better assess whether there are extrinsic compressive forces. If not I will schedule  intervention and then thrombectomy of the graft for next week. I believe he would do better with a general anesthesia as well given his past narcotics dependence he was fairly difficult to treat with conscious sedation.  Also based on these findings I will aspirate the aneurysmal area to ensure this is not frank pus or a persistent pseudoaneurysm indicated by pure blood return.  Therefore, even though the sterile field had not been broken a second ChloraPrep was used to prep the aneurysmal area lidocaine was infiltrated into the soft tissue a pursestring suture of 4-0 Monocryl was placed and a 18-gauge needle was introduced and aspiration was performed soft gelatinous clot was returned no evidence of purulence no frank blood suggesting a pseudoaneurysm this appears to be a hematoma. Therefore I will proceed with the above plan a CT scan of the chest followed by revision of the graft treatment of the hematoma thrombectomy and intervention of the central venous anatomy.  A 4-0 Monocryl purse-string suture was sewn around the sheath.  The sheath was removed and light pressure was applied.  A sterile bandage was applied to the puncture site.    COMPLICATIONS: Stable  CONDITION: Unchanged  Katha Cabal, M.D [facility] Office: [phone]  04/23/2016 6:13 PM

## 2016-04-23 NOTE — H&P (Signed)
[facility] History & Physical Update  The patient was interviewed and re-examined.  The patient's previous History and Physical has been reviewed and is unchanged.  There is no change in the plan of care. We plan to proceed with the scheduled procedure.  Alysen Smylie, Dolores L[place], MD  04/23/2016, 2:08 PM

## 2016-04-26 ENCOUNTER — Encounter: Payer: Self-pay | Admitting: Vascular Surgery

## 2016-04-27 ENCOUNTER — Other Ambulatory Visit: Payer: Self-pay | Admitting: Vascular Surgery

## 2016-04-27 DIAGNOSIS — J9859 Other diseases of mediastinum, not elsewhere classified: Secondary | ICD-10-CM

## 2016-05-05 ENCOUNTER — Ambulatory Visit (HOSPITAL_COMMUNITY)
Admission: RE | Admit: 2016-05-05 | Discharge: 2016-05-05 | Disposition: A | Discharge status: Home / Self Care | Payer: Medicaid Other | Source: Ambulatory Visit | Attending: Vascular Surgery | Admitting: Vascular Surgery

## 2016-05-05 DIAGNOSIS — R911 Solitary pulmonary nodule: Secondary | ICD-10-CM | POA: Diagnosis not present

## 2016-05-05 DIAGNOSIS — J9859 Other diseases of mediastinum, not elsewhere classified: Secondary | ICD-10-CM

## 2016-05-05 DIAGNOSIS — R222 Localized swelling, mass and lump, trunk: Secondary | ICD-10-CM | POA: Diagnosis present

## 2016-05-05 MED ORDER — IOPAMIDOL (ISOVUE-300) INJECTION 61%
75.0000 mL | Freq: Once | INTRAVENOUS | Status: AC | PRN
  Administered 2016-05-05: 75 mL via INTRAVENOUS

## 2016-05-06 ENCOUNTER — Other Ambulatory Visit: Payer: Self-pay | Admitting: Vascular Surgery

## 2016-05-10 ENCOUNTER — Other Ambulatory Visit: Payer: Medicaid Other

## 2016-05-13 ENCOUNTER — Other Ambulatory Visit: Payer: Self-pay

## 2016-05-13 ENCOUNTER — Encounter
Admission: RE | Admit: 2016-05-13 | Discharge: 2016-05-13 | Disposition: A | Discharge status: Home / Self Care | Payer: Medicaid Other | Source: Ambulatory Visit | Attending: Vascular Surgery | Admitting: Vascular Surgery

## 2016-05-13 DIAGNOSIS — Z01810 Encounter for preprocedural cardiovascular examination: Secondary | ICD-10-CM | POA: Insufficient documentation

## 2016-05-13 DIAGNOSIS — Z01812 Encounter for preprocedural laboratory examination: Secondary | ICD-10-CM | POA: Insufficient documentation

## 2016-05-13 LAB — CBC WITH DIFFERENTIAL/PLATELET
BASOS PCT: 1 %
Basophils Absolute: 0.1 10*3/uL (ref 0–0.1)
Eosinophils Absolute: 0.4 10*3/uL (ref 0–0.7)
Eosinophils Relative: 6 %
HEMATOCRIT: 30.3 % — AB (ref 40.0–52.0)
Hemoglobin: 10.1 g/dL — ABNORMAL LOW (ref 13.0–18.0)
LYMPHS ABS: 1.5 10*3/uL (ref 1.0–3.6)
Lymphocytes Relative: 22 %
MCH: 28.1 pg (ref 26.0–34.0)
MCHC: 33.5 g/dL (ref 32.0–36.0)
MCV: 83.9 fL (ref 80.0–100.0)
MONO ABS: 0.7 10*3/uL (ref 0.2–1.0)
MONOS PCT: 10 %
NEUTROS ABS: 4.1 10*3/uL (ref 1.4–6.5)
Neutrophils Relative %: 61 %
Platelets: 361 10*3/uL (ref 150–440)
RBC: 3.61 MIL/uL — ABNORMAL LOW (ref 4.40–5.90)
RDW: 13.7 % (ref 11.5–14.5)
WBC: 6.9 10*3/uL (ref 3.8–10.6)

## 2016-05-13 LAB — BASIC METABOLIC PANEL
ANION GAP: 10 (ref 5–15)
BUN: 37 mg/dL — ABNORMAL HIGH (ref 6–20)
CALCIUM: 8.8 mg/dL — AB (ref 8.9–10.3)
CO2: 27 mmol/L (ref 22–32)
CREATININE: 5.26 mg/dL — AB (ref 0.61–1.24)
Chloride: 101 mmol/L (ref 101–111)
GFR calc Af Amer: 15 mL/min — ABNORMAL LOW (ref >60)
GFR calc non Af Amer: 13 mL/min — ABNORMAL LOW (ref >60)
GLUCOSE: 81 mg/dL (ref 65–99)
Potassium: 4 mmol/L (ref 3.5–5.1)
Sodium: 138 mmol/L (ref 135–145)

## 2016-05-13 LAB — SURGICAL PCR SCREEN
MRSA, PCR: NEGATIVE
STAPHYLOCOCCUS AUREUS: NEGATIVE

## 2016-05-13 LAB — APTT: aPTT: 35 seconds (ref 24–36)

## 2016-05-13 LAB — TYPE AND SCREEN
ABO/RH(D): O POS
ANTIBODY SCREEN: NEGATIVE

## 2016-05-13 LAB — PROTIME-INR
INR: 1.05
Prothrombin Time: 13.7 seconds (ref 11.4–15.2)

## 2016-05-13 NOTE — Patient Instructions (Signed)
Your procedure is scheduled on: TOMORROW Report to Day Surgery. 2ND FLOOR MEDICAL MALL ENTRANCE To find out your arrival time please call [phone] between 1PM - 3PM on TODAY.  Remember: Instructions that are not followed completely may result in serious medical risk, up to and including death, or upon the discretion of your surgeon and anesthesiologist your surgery may need to be rescheduled.    __X__ 1. Do not eat food or drink liquids after midnight. No gum chewing or hard candies.     __X__ 2. No Alcohol for 24 hours before or after surgery.   ____ 3. Bring all medications with you on the day of surgery if instructed.    __X__ 4. Notify your doctor if there is any change in your medical condition     (cold, fever, infections).     Do not wear jewelry, make-up, hairpins, clips or nail polish.  Do not wear lotions, powders, or perfumes.   Do not shave 48 hours prior to surgery. Men may shave face and neck.  Do not bring valuables to the hospital.    [facility] is not responsible for any belongings or valuables.               Contacts, dentures or bridgework may not be worn into surgery.  Leave your suitcase in the car. After surgery it may be brought to your room.  For patients admitted to the hospital, discharge time is determined by your                treatment team.   Patients discharged the day of surgery will not be allowed to drive home.   Please read over the following fact sheets that you were given:   MRSA Information and Surgical Site Infection Prevention   __X__ Take these medicines the morning of surgery with A SIP OF WATER:    1. AMLODIPINE  2. HYDRALAZINE  3. LABETOLOL  4. METHADONE  5.  6.  ____ Fleet Enema (as directed)   __X__ Use CHG Soap as directed  ____ Use inhalers on the day of surgery  ____ Stop metformin 2 days prior to surgery    ____ Take 1/2 of usual insulin dose the night before surgery and none on the morning of surgery.   ____  Stop Coumadin/Plavix/aspirin on   ____ Stop Anti-inflammatories on    ____ Stop supplements until after surgery.    ____ Bring C-Pap to the hospital.

## 2016-05-13 NOTE — Pre-Procedure Instructions (Signed)
EKG COMPARED WITH EKG FROM 12/25/14 AND LAFB,PROLONGED QT NOTED

## 2016-05-14 ENCOUNTER — Ambulatory Visit: Payer: Medicaid Other | Admitting: Certified Registered Nurse Anesthetist

## 2016-05-14 ENCOUNTER — Encounter: Payer: Self-pay | Admitting: Anesthesiology

## 2016-05-14 ENCOUNTER — Ambulatory Visit
Admission: RE | Admit: 2016-05-14 | Discharge: 2016-05-14 | Disposition: A | Discharge status: Home / Self Care | Payer: Medicaid Other | Source: Ambulatory Visit | Attending: Vascular Surgery | Admitting: Vascular Surgery

## 2016-05-14 ENCOUNTER — Encounter
Admission: RE | Disposition: A | Discharge status: Home / Self Care | Payer: Self-pay | Source: Ambulatory Visit | Attending: Vascular Surgery

## 2016-05-14 ENCOUNTER — Ambulatory Visit: Payer: Medicaid Other

## 2016-05-14 DIAGNOSIS — F17200 Nicotine dependence, unspecified, uncomplicated: Secondary | ICD-10-CM | POA: Insufficient documentation

## 2016-05-14 DIAGNOSIS — F419 Anxiety disorder, unspecified: Secondary | ICD-10-CM | POA: Insufficient documentation

## 2016-05-14 DIAGNOSIS — Z823 Family history of stroke: Secondary | ICD-10-CM | POA: Diagnosis not present

## 2016-05-14 DIAGNOSIS — T82868A Thrombosis of vascular prosthetic devices, implants and grafts, initial encounter: Secondary | ICD-10-CM | POA: Insufficient documentation

## 2016-05-14 DIAGNOSIS — D649 Anemia, unspecified: Secondary | ICD-10-CM | POA: Diagnosis not present

## 2016-05-14 DIAGNOSIS — X58XXXA Exposure to other specified factors, initial encounter: Secondary | ICD-10-CM | POA: Diagnosis not present

## 2016-05-14 DIAGNOSIS — B954 Other streptococcus as the cause of diseases classified elsewhere: Secondary | ICD-10-CM | POA: Insufficient documentation

## 2016-05-14 DIAGNOSIS — M1990 Unspecified osteoarthritis, unspecified site: Secondary | ICD-10-CM | POA: Insufficient documentation

## 2016-05-14 DIAGNOSIS — Z992 Dependence on renal dialysis: Secondary | ICD-10-CM | POA: Diagnosis not present

## 2016-05-14 DIAGNOSIS — I871 Compression of vein: Secondary | ICD-10-CM | POA: Diagnosis not present

## 2016-05-14 DIAGNOSIS — K219 Gastro-esophageal reflux disease without esophagitis: Secondary | ICD-10-CM | POA: Insufficient documentation

## 2016-05-14 DIAGNOSIS — Z8619 Personal history of other infectious and parasitic diseases: Secondary | ICD-10-CM | POA: Insufficient documentation

## 2016-05-14 DIAGNOSIS — I120 Hypertensive chronic kidney disease with stage 5 chronic kidney disease or end stage renal disease: Secondary | ICD-10-CM | POA: Insufficient documentation

## 2016-05-14 DIAGNOSIS — Z79899 Other long term (current) drug therapy: Secondary | ICD-10-CM | POA: Insufficient documentation

## 2016-05-14 DIAGNOSIS — I499 Cardiac arrhythmia, unspecified: Secondary | ICD-10-CM | POA: Diagnosis not present

## 2016-05-14 DIAGNOSIS — J449 Chronic obstructive pulmonary disease, unspecified: Secondary | ICD-10-CM | POA: Insufficient documentation

## 2016-05-14 DIAGNOSIS — Z8249 Family history of ischemic heart disease and other diseases of the circulatory system: Secondary | ICD-10-CM | POA: Diagnosis not present

## 2016-05-14 DIAGNOSIS — Z833 Family history of diabetes mellitus: Secondary | ICD-10-CM | POA: Diagnosis not present

## 2016-05-14 DIAGNOSIS — N186 End stage renal disease: Secondary | ICD-10-CM | POA: Insufficient documentation

## 2016-05-14 HISTORY — PX: HEMATOMA EVACUATION: SHX5118

## 2016-05-14 HISTORY — PX: REVISION OF ARTERIOVENOUS GORETEX GRAFT: SHX6073

## 2016-05-14 LAB — POCT I-STAT 4, (NA,K, GLUC, HGB,HCT)
GLUCOSE: 94 mg/dL (ref 65–99)
HEMATOCRIT: 30 % — AB (ref 39.0–52.0)
HEMOGLOBIN: 10.2 g/dL — AB (ref 13.0–17.0)
Potassium: 4.2 mmol/L (ref 3.5–5.1)
Sodium: 139 mmol/L (ref 135–145)

## 2016-05-14 LAB — URINE DRUG SCREEN, QUALITATIVE (ARMC ONLY)
Amphetamines, Ur Screen: NOT DETECTED
BARBITURATES, UR SCREEN: NOT DETECTED
BENZODIAZEPINE, UR SCRN: NOT DETECTED
Cannabinoid 50 Ng, Ur ~~LOC~~: NOT DETECTED
Cocaine Metabolite,Ur ~~LOC~~: NOT DETECTED
MDMA (Ecstasy)Ur Screen: NOT DETECTED
Methadone Scn, Ur: POSITIVE — AB
OPIATE, UR SCREEN: POSITIVE — AB
PHENCYCLIDINE (PCP) UR S: NOT DETECTED
Tricyclic, Ur Screen: NOT DETECTED

## 2016-05-14 SURGERY — REVISION OF ARTERIOVENOUS GORETEX GRAFT
Anesthesia: General | Laterality: Left | Wound class: Clean

## 2016-05-14 MED ORDER — PROPOFOL 10 MG/ML IV BOLUS
INTRAVENOUS | Status: DC | PRN
  Administered 2016-05-14: 200 mg via INTRAVENOUS

## 2016-05-14 MED ORDER — FAMOTIDINE 20 MG PO TABS: 20.0000 mg | ORAL_TABLET | Freq: Once | ORAL | Status: DC

## 2016-05-14 MED ORDER — NEOSTIGMINE METHYLSULFATE 10 MG/10ML IV SOLN
INTRAVENOUS | Status: DC | PRN
  Administered 2016-05-14: 5 mg via INTRAVENOUS

## 2016-05-14 MED ORDER — BUPIVACAINE HCL (PF) 0.5 % IJ SOLN
INTRAMUSCULAR | Status: AC
  Filled 2016-05-14: qty 30

## 2016-05-14 MED ORDER — MEPERIDINE HCL 25 MG/ML IJ SOLN
25.0000 mg | Freq: Once | INTRAMUSCULAR | Status: AC
  Administered 2016-05-14: 25 mg via INTRAVENOUS

## 2016-05-14 MED ORDER — HEPARIN SODIUM (PORCINE) 1000 UNIT/ML IJ SOLN
INTRAMUSCULAR | Status: DC | PRN
  Administered 2016-05-14: 3000 [IU] via INTRAVENOUS

## 2016-05-14 MED ORDER — DEXAMETHASONE SODIUM PHOSPHATE 10 MG/ML IJ SOLN
INTRAMUSCULAR | Status: DC | PRN
  Administered 2016-05-14: 4 mg via INTRAVENOUS

## 2016-05-14 MED ORDER — CLINDAMYCIN PHOSPHATE 300 MG/50ML IV SOLN
300.0000 mg | Freq: Once | INTRAVENOUS | Status: AC
  Administered 2016-05-14: 300 mg via INTRAVENOUS

## 2016-05-14 MED ORDER — ROCURONIUM BROMIDE 100 MG/10ML IV SOLN
INTRAVENOUS | Status: DC | PRN
  Administered 2016-05-14: 5 mg via INTRAVENOUS
  Administered 2016-05-14: 40 mg via INTRAVENOUS
  Administered 2016-05-14: 5 mg via INTRAVENOUS

## 2016-05-14 MED ORDER — OXYCODONE-ACETAMINOPHEN 5-325 MG PO TABS: 1.0000 | ORAL_TABLET | Freq: Four times a day (QID) | ORAL | 0 refills | Status: DC | PRN

## 2016-05-14 MED ORDER — FENTANYL CITRATE (PF) 100 MCG/2ML IJ SOLN
INTRAMUSCULAR | Status: DC | PRN
  Administered 2016-05-14: 100 ug via INTRAVENOUS

## 2016-05-14 MED ORDER — SODIUM CHLORIDE 0.9 % IV SOLN
INTRAVENOUS | Status: DC
  Administered 2016-05-14: 11:00:00 via INTRAVENOUS

## 2016-05-14 MED ORDER — ONDANSETRON HCL 4 MG/2ML IJ SOLN
4.0000 mg | Freq: Once | INTRAMUSCULAR | Status: DC | PRN
Start: 2016-05-14 — End: 2016-05-14

## 2016-05-14 MED ORDER — ONDANSETRON HCL 4 MG/2ML IJ SOLN
INTRAMUSCULAR | Status: DC | PRN
  Administered 2016-05-14: 4 mg via INTRAVENOUS

## 2016-05-14 MED ORDER — MIDAZOLAM HCL 2 MG/2ML IJ SOLN
INTRAMUSCULAR | Status: DC | PRN
  Administered 2016-05-14: 2 mg via INTRAVENOUS

## 2016-05-14 MED ORDER — CHLORHEXIDINE GLUCONATE CLOTH 2 % EX PADS: 6.0000 | MEDICATED_PAD | Freq: Once | CUTANEOUS | Status: DC

## 2016-05-14 MED ORDER — FAMOTIDINE 20 MG PO TABS
20.0000 mg | ORAL_TABLET | Freq: Once | ORAL | Status: AC
  Administered 2016-05-14: 20 mg via ORAL

## 2016-05-14 MED ORDER — GLYCOPYRROLATE 0.2 MG/ML IJ SOLN
INTRAMUSCULAR | Status: DC | PRN
  Administered 2016-05-14: .8 mg via INTRAVENOUS

## 2016-05-14 MED ORDER — FAMOTIDINE 20 MG PO TABS
ORAL_TABLET | ORAL | Status: AC
  Administered 2016-05-14: 20 mg via ORAL
  Filled 2016-05-14: qty 1

## 2016-05-14 MED ORDER — FENTANYL CITRATE (PF) 100 MCG/2ML IJ SOLN
INTRAMUSCULAR | Status: AC
  Administered 2016-05-14: 25 ug via INTRAVENOUS
  Filled 2016-05-14: qty 2

## 2016-05-14 MED ORDER — SODIUM CHLORIDE 0.9 % IV SOLN
INTRAVENOUS | Status: DC | PRN
  Administered 2016-05-14: 100 mL via INTRAMUSCULAR

## 2016-05-14 MED ORDER — MEPERIDINE HCL 25 MG/ML IJ SOLN
INTRAMUSCULAR | Status: AC
  Administered 2016-05-14: 25 mg via INTRAVENOUS
  Filled 2016-05-14: qty 1

## 2016-05-14 MED ORDER — EPHEDRINE SULFATE 50 MG/ML IJ SOLN
INTRAMUSCULAR | Status: DC | PRN
  Administered 2016-05-14: 5 mg via INTRAVENOUS
  Administered 2016-05-14: 10 mg via INTRAVENOUS
  Administered 2016-05-14: 5 mg via INTRAVENOUS

## 2016-05-14 MED ORDER — OXYCODONE-ACETAMINOPHEN 5-325 MG PO TABS
ORAL_TABLET | ORAL | Status: AC
  Filled 2016-05-14: qty 2

## 2016-05-14 MED ORDER — CLINDAMYCIN PHOSPHATE 300 MG/50ML IV SOLN
INTRAVENOUS | Status: AC
  Administered 2016-05-14: 300 mg via INTRAVENOUS
  Filled 2016-05-14: qty 50

## 2016-05-14 MED ORDER — HEPARIN SODIUM (PORCINE) 5000 UNIT/ML IJ SOLN
INTRAMUSCULAR | Status: AC
  Filled 2016-05-14: qty 1

## 2016-05-14 MED ORDER — PHENYLEPHRINE HCL 10 MG/ML IJ SOLN
INTRAMUSCULAR | Status: DC | PRN
  Administered 2016-05-14 (×2): 100 ug via INTRAVENOUS
  Administered 2016-05-14: 50 ug via INTRAVENOUS
  Administered 2016-05-14: 100 ug via INTRAVENOUS
  Administered 2016-05-14: 50 ug via INTRAVENOUS
  Administered 2016-05-14: 100 ug via INTRAVENOUS
  Administered 2016-05-14: 50 ug via INTRAVENOUS

## 2016-05-14 MED ORDER — OXYCODONE-ACETAMINOPHEN 5-325 MG PO TABS
1.0000 | ORAL_TABLET | Freq: Four times a day (QID) | ORAL | Status: DC | PRN
  Administered 2016-05-14: 2 via ORAL

## 2016-05-14 MED ORDER — NALOXONE HCL 0.4 MG/ML IJ SOLN
INTRAMUSCULAR | Status: DC | PRN
  Administered 2016-05-14 (×2): 40 ug via INTRAVENOUS

## 2016-05-14 MED ORDER — LIDOCAINE HCL (CARDIAC) 20 MG/ML IV SOLN
INTRAVENOUS | Status: DC | PRN
  Administered 2016-05-14: 80 mg via INTRAVENOUS

## 2016-05-14 MED ORDER — FENTANYL CITRATE (PF) 100 MCG/2ML IJ SOLN
25.0000 ug | INTRAMUSCULAR | Status: AC | PRN
  Administered 2016-05-14 (×6): 25 ug via INTRAVENOUS

## 2016-05-14 SURGICAL SUPPLY — 60 items
APPLIER CLIP 11 MED OPEN (CLIP)
APPLIER CLIP 9.375 SM OPEN (CLIP)
BAG DECANTER FOR FLEXI CONT (MISCELLANEOUS) ×2 IMPLANT
BLADE SURG 15 STRL LF DISP TIS (BLADE) ×1 IMPLANT
BLADE SURG 15 STRL SS (BLADE) ×1
BLADE SURG SZ11 CARB STEEL (BLADE) ×2 IMPLANT
BOOT SUTURE AID YELLOW STND (SUTURE) ×4 IMPLANT
BRUSH SCRUB 4% CHG (MISCELLANEOUS) ×2 IMPLANT
CANISTER SUCT 1200ML W/VALVE (MISCELLANEOUS) ×2 IMPLANT
CATH EMBOLECTOMY 4X80 (CATHETERS) ×2 IMPLANT
CATH FOGERTY 4X80 WAS (CATHETERS) ×2 IMPLANT
CHLORAPREP W/TINT 26ML (MISCELLANEOUS) ×2 IMPLANT
CLIP APPLIE 11 MED OPEN (CLIP) IMPLANT
CLIP APPLIE 9.375 SM OPEN (CLIP) IMPLANT
CNTNR SPEC 2.5X3XGRAD LEK (MISCELLANEOUS) ×2
CONT SPEC 4OZ STER OR WHT (MISCELLANEOUS) ×2
CONTAINER SPEC 2.5X3XGRAD LEK (MISCELLANEOUS) ×2 IMPLANT
DECANTER SPIKE VIAL GLASS SM (MISCELLANEOUS) ×2 IMPLANT
DRESSING SURGICEL FIBRLLR 1X2 (HEMOSTASIS) ×1 IMPLANT
DRSG SURGICEL FIBRILLAR 1X2 (HEMOSTASIS) ×2
ELECT CAUTERY BLADE 6.4 (BLADE) ×2 IMPLANT
ELECT REM PT RETURN 9FT ADLT (ELECTROSURGICAL) ×2
ELECTRODE REM PT RTRN 9FT ADLT (ELECTROSURGICAL) ×1 IMPLANT
GEL ULTRASOUND 20GR AQUASONIC (MISCELLANEOUS) IMPLANT
GLOVE SURG SYN 8.0 (GLOVE) ×2 IMPLANT
GOWN STRL REUS W/ TWL LRG LVL3 (GOWN DISPOSABLE) ×1 IMPLANT
GOWN STRL REUS W/ TWL XL LVL3 (GOWN DISPOSABLE) ×2 IMPLANT
GOWN STRL REUS W/TWL LRG LVL3 (GOWN DISPOSABLE) ×1
GOWN STRL REUS W/TWL XL LVL3 (GOWN DISPOSABLE) ×2
IV NS 500ML (IV SOLUTION) ×1
IV NS 500ML BAXH (IV SOLUTION) ×1 IMPLANT
KIT RM TURNOVER STRD PROC AR (KITS) ×2 IMPLANT
LABEL OR SOLS (LABEL) ×2 IMPLANT
LIQUID BAND (GAUZE/BANDAGES/DRESSINGS) ×2 IMPLANT
LOOP RED MAXI  1X406MM (MISCELLANEOUS) ×1
LOOP VESSEL MAXI 1X406 RED (MISCELLANEOUS) ×1 IMPLANT
LOOP VESSEL MINI 0.8X406 BLUE (MISCELLANEOUS) ×1 IMPLANT
LOOPS BLUE MINI 0.8X406MM (MISCELLANEOUS) ×1
NEEDLE FILTER BLUNT 18X 1/2SAF (NEEDLE) ×1
NEEDLE FILTER BLUNT 18X1 1/2 (NEEDLE) ×1 IMPLANT
NS IRRIG 500ML POUR BTL (IV SOLUTION) ×2 IMPLANT
PACK EXTREMITY ARMC (MISCELLANEOUS) ×2 IMPLANT
PAD PREP 24X41 OB/GYN DISP (PERSONAL CARE ITEMS) ×2 IMPLANT
SPONGE XRAY 4X4 16PLY STRL (MISCELLANEOUS) ×2 IMPLANT
STOCKINETTE STRL 4IN 9604848 (GAUZE/BANDAGES/DRESSINGS) ×2 IMPLANT
SUT GORETEX CV-4 36 (SUTURE) ×14 IMPLANT
SUT GORETEX CV-6TTC-13 36IN (SUTURE) ×4 IMPLANT
SUT MNCRL+ 5-0 UNDYED PC-3 (SUTURE) ×2 IMPLANT
SUT MONOCRYL 5-0 (SUTURE) ×2
SUT PROLENE 6 0 BV (SUTURE) ×16 IMPLANT
SUT SILK 2 0 (SUTURE) ×1
SUT SILK 2-0 18XBRD TIE 12 (SUTURE) ×1 IMPLANT
SUT SILK 3 0 (SUTURE) ×1
SUT SILK 3-0 18XBRD TIE 12 (SUTURE) ×1 IMPLANT
SUT SILK 4 0 (SUTURE) ×1
SUT SILK 4-0 18XBRD TIE 12 (SUTURE) ×1 IMPLANT
SUT VIC AB 3-0 SH 27 (SUTURE) ×2
SUT VIC AB 3-0 SH 27X BRD (SUTURE) ×2 IMPLANT
SYR 20CC LL (SYRINGE) ×2 IMPLANT
SYR 3ML LL SCALE MARK (SYRINGE) ×2 IMPLANT

## 2016-05-14 NOTE — Transfer of Care (Signed)
Immediate Anesthesia Transfer of Care Note  Patient: Terrance Reynolds  Procedure(s) Performed: Procedure(s): REVISION OF ARTERIOVENOUS GORETEX GRAFT ( DECLOT ) (Left) EVACUATION HEMATOMA ( DRAINAGE ) (Left)  Patient Location: PACU  Anesthesia Type:General  Level of Consciousness: awake, alert  and oriented  Airway & Oxygen Therapy: Patient Spontanous Breathing and Patient connected to face mask oxygen  Post-op Assessment: Report given to RN and Post -op Vital signs reviewed and stable  Post vital signs: Reviewed, stable  Last Vitals:  Vitals:   05/14/16 0945 05/14/16 1407  BP: (!) 153/94 (!) 152/103  Pulse: 82 97  Resp: 16 17  Temp: 36.9 C 36.6 C    Last Pain:  Vitals:   05/14/16 0945  TempSrc: Oral  PainSc: 3       Patients Stated Pain Goal: 2 ([id number])  Complications: No apparent anesthesia complications

## 2016-05-14 NOTE — H&P (Signed)
[facility] History & Physical Update  The patient was interviewed and re-examined.  The patient's previous History and Physical has been reviewed and is unchanged.  There is no change in the plan of care. We plan to proceed with the scheduled procedure.  Rahcel Shutes, Dolores Lory, MD  05/14/2016, 10:09 AM

## 2016-05-14 NOTE — Op Note (Signed)
OPERATIVE NOTE   PROCEDURE: 1.  Repair of brachial artery at the level of the anastomosis of the brachial axillary AV graft 2.  Open thrombectomy of the left arm brachial axillary AV graft 3.  Percutaneous injection of contrast left arm brachial axillary dialysis graft. 4.  Percutaneous transluminal angioplasty and stent placement venous outflow left arm brachial axillary dialysis graft  PRE-OPERATIVE DIAGNOSIS: Complication of left arm brachial axillary dialysis graft with hematoma formation; thrombosis of left arm brachial axillary dialysis graft; superior vena cava syndrome; end-stage renal disease requiring hemodialysis  POST-OPERATIVE DIAGNOSIS: Same  SURGEON: Shiva Sahagian, Dolores Lory  ASSISTANT(S): Ms. Hezzie Bump  ANESTHESIA: general  ESTIMATED BLOOD LOSS: 200 cc  FINDING(S): In the area of hematoma formation there appears to have been a small laceration to the graft. There also was a disruption of the medial suture line at the level of the anastomosis. Thrombus was noted in the graft.  SPECIMEN(S):  Graft contents to micro-biology for culture; pseudoaneurysm/hematoma contents to pathology for permanent section; thrombus from graft to pathology for permanent section  INDICATIONS:   Terrance Reynolds is a 35 y.o. male who presents with painful mass in the left arm overlying his graft. This occurred abruptly at the time of cannulation. It appears to be above the anastomosis. Patient has undergone angiography was noted at thrombosis of his AV graft no evidence of pseudoaneurysm was identified at that time. He is therefore undergoing evacuation of the hematoma as he states is quite painful and thrombectomy of his graft. Risks and benefits are reviewed and she has agreed to proceed..  DESCRIPTION: After full informed written consent was obtained from the patient, the patient was brought back to the operating room and placed supine upon the operating table.  Prior to induction, the  patient received IV antibiotics.   After obtaining adequate anesthesia, the patient was then prepped and draped in the standard fashion for a  open thrombectomy of graft with evacuation of the hematoma left arm.  The previous incisional scar was then re-incised overlying the arterial anastomosis and the dissection was carried down to the graft which was noted to be unincorporated. More proximal there was thrombus noted in the area of presumed hematoma formation. Some of this was easily evacuated and this was passed off the specimen.  As the dissection was carried down toward the anastomosis bleeding was identified which appeared to be arterial from the medial side. The suture line could not be identified at this time the did not appear to be any pseudoaneurysm or pseudoaneurysm capsule therefore I do not know if this was pre-existing or if this was iatrogenic from during the dissection. Nevertheless, the bleeding was sufficient that a interrupted suture was not successful and therefore the incision was extended both proximally and distally and the dissection carried down to the brachial artery above and below the anastomosis in areas that had not been previously dissected brachial artery was then looped with Silastic vessel loops for vascular control without incident. The dissection was then carried along the course of the brachial artery ligating veins as they were encountered as needed and securing small branches with 6-0 Prolene when encountered from the brachial artery. Ultimately the entire anastomosis suture line was dissected free and there appeared to be bleeding from the medial wall along the suture line. The appearance did not seem to fit a disruption there was no pseudoaneurysm or capsular hematoma formation at this level nevertheless a clearly this was not an acceptable anastomosis  at this time.  Given this finding 3000 units of heparin was given the Marx tip was used to to irrigate the brachial  artery proximally and distally after I transected the graft approximately 3 mm above the suture line. I was then able to clear the anastomosis of any thrombotic material again this was passed off as specimen this was performed with a Soil scientist the patency of the anastomosis was checked with a right angle and subsequently the medial wall was reconstructed with multiple interrupted CV 7 suture using a piece of Gore-Tex as a pledget. Individual sutures were secured Flushing maneuvers were performed the suture line was tested and found to be intact.  A #4 Fogarty was then delivered onto the field advanced through the graft through the open-end and 3 passes were made to clear the graft of thrombus again some of this was passed off as specimen also the appearance was concerning as it was more murky and not maroon or consistent with thrombus and therefore culture was also obtained.  The graft was then reconstructed in an end-to-end fashion using running CV 6 suture Flushing maneuvers were performed and flow was established in the graft. Flow was also established distally and radial pulses maintain. Palpation of the graft demonstrated that it was markedly pulsatile. This indicated that there was likely a stricture stenosis that had caused the initial thrombosis.  The hematoma was then debrided and the area irrigated with sterile saline and Surgicel placed around the wound and in the bed of the hematoma. Some bleeding after the reperfusion was noted coming from upwards in the hematoma from the graft and I'm suspicious that the hematoma was originally from a laceration of the cannulation needle area  Given the fact that we did not have a thrill but marked pulsatility I elected to access the graft percutaneously. Micropuncture needle was inserted followed by microwire J-wire followed by 6 French sheath. Angiography was then performed from this percutaneous access and this demonstrated a high-grade stenosis at the  venous anastomosis. Glidewire and Kumpe catheter were used to negotiate through this area and subsequently hand injection contrast demonstrated patency of the axillary vein. A 8 x 40 flared flair stent was then applied across the anastomosis and postdilated to 20 atm with a 8 mm Dorado balloon. Follow-up imaging now demonstrated rapid flow of contrast with excellent filling of the venous system there is now a palpable thrill in the graft and the pulsatility has been eliminated. Radial pulses maintained. Pursestring suture of 4-0 Monocryl was then placed around the sheath and the sheath is removed.  The incision over the anastomosis is then irrigated Surgicel was placed hemostasis is assured and the wound is then closed in layers using 3-0 Vicryl followed by 4-0 Monocryl and then Dermabond.  Patient tolerated procedure well without immediate complication.  COMPLICATIONS: None  CONDITION: [name]  Office: [phone]   05/14/2016, 1:49 PM

## 2016-05-14 NOTE — OR Nursing (Signed)
3000 units of Heparin given by anesthesia at 1153.

## 2016-05-14 NOTE — Anesthesia Preprocedure Evaluation (Addendum)
Anesthesia Evaluation  Patient identified by MRN, date of birth, ID band Patient awake    Reviewed: Allergy & Precautions, NPO status , Patient's Chart, lab work & pertinent test results, reviewed documented beta blocker date and time   Airway Mallampati: II       Dental  (+) Teeth Intact   Pulmonary shortness of breath and with exertion, COPD, Current Smoker,     + decreased breath sounds      Cardiovascular Exercise Tolerance: Good hypertension, Pt. on medications and Pt. on home beta blockers  Rhythm:Regular Rate:Normal     Neuro/Psych Anxiety    GI/Hepatic GERD  Medicated and Controlled,(+)     substance abuse  , Hepatitis -, C  Endo/Other  negative endocrine ROS  Renal/GU Dialysis and ESRFRenal disease     Musculoskeletal  (+) Arthritis , Osteoarthritis,    Abdominal Normal abdominal exam  (+)   Peds negative pediatric ROS (+)  Hematology  (+) anemia ,   Anesthesia Other Findings   Reproductive/Obstetrics                             Anesthesia Physical  Anesthesia Plan  ASA: III  Anesthesia Plan: General   Post-op Pain Management:    Induction: Intravenous  Airway Management Planned: Oral ETT  Additional Equipment:   Intra-op Plan:   Post-operative Plan: Extubation in OR  Informed Consent: I have reviewed the patients History and Physical, chart, labs and discussed the procedure including the risks, benefits and alternatives for the proposed anesthesia with the patient or authorized representative who has indicated his/her understanding and acceptance.     Plan Discussed with: CRNA  Anesthesia Plan Comments:         Anesthesia Quick Evaluation

## 2016-05-14 NOTE — Anesthesia Postprocedure Evaluation (Signed)
Anesthesia Post Note  Patient: Terrance Reynolds  Procedure(s) Performed: Procedure(s) (LRB): REVISION OF ARTERIOVENOUS GORETEX GRAFT ( DECLOT ) (Left) EVACUATION HEMATOMA ( DRAINAGE ) (Left)  Patient location during evaluation: PACU Anesthesia Type: General Level of consciousness: awake and alert and oriented Pain management: pain level controlled Vital Signs Assessment: post-procedure vital signs reviewed and stable Respiratory status: spontaneous breathing Cardiovascular status: blood pressure returned to baseline Anesthetic complications: no    Last Vitals:  Vitals:   05/14/16 1442 05/14/16 1447  BP:    Pulse: 81 82  Resp: 20 20  Temp:      Last Pain:  Vitals:   05/14/16 1447  TempSrc:   PainSc: Asleep                 Valree Feild

## 2016-05-14 NOTE — Anesthesia Procedure Notes (Signed)
Procedure Name: Intubation Date/Time: 05/14/2016 11:17 AM Performed by: Demetrius Charity Pre-anesthesia Checklist: Patient identified, Patient being monitored, Timeout performed, Emergency Drugs available and Suction available Patient Re-evaluated:Patient Re-evaluated prior to inductionOxygen Delivery Method: Circle system utilized Preoxygenation: Pre-oxygenation with 100% oxygen Intubation Type: IV induction Ventilation: Mask ventilation without difficulty Laryngoscope Size: Miller and 2 Grade View: Grade I Tube type: Oral Tube size: 7.5 mm Number of attempts: 1 Airway Equipment and Method: Stylet Placement Confirmation: ETT inserted through vocal cords under direct vision,  positive ETCO2 and breath sounds checked- equal and bilateral Secured at: 23 cm Tube secured with: Tape Dental Injury: Teeth and Oropharynx as per pre-operative assessment

## 2016-05-14 NOTE — Discharge Instructions (Signed)

## 2016-05-17 ENCOUNTER — Encounter: Payer: Self-pay | Admitting: Vascular Surgery

## 2016-05-17 LAB — SURGICAL PATHOLOGY

## 2016-05-19 LAB — AEROBIC/ANAEROBIC CULTURE W GRAM STAIN (SURGICAL/DEEP WOUND)

## 2016-05-19 LAB — AEROBIC/ANAEROBIC CULTURE (SURGICAL/DEEP WOUND)

## 2016-05-29 ENCOUNTER — Emergency Department (HOSPITAL_COMMUNITY)
Admission: EM | Admit: 2016-05-29 | Discharge: 2016-05-29 | Disposition: A | Discharge status: Home / Self Care | Payer: Medicaid Other | Attending: Emergency Medicine | Admitting: Emergency Medicine

## 2016-05-29 ENCOUNTER — Encounter (HOSPITAL_COMMUNITY): Payer: Self-pay

## 2016-05-29 DIAGNOSIS — F17210 Nicotine dependence, cigarettes, uncomplicated: Secondary | ICD-10-CM | POA: Insufficient documentation

## 2016-05-29 DIAGNOSIS — M79602 Pain in left arm: Secondary | ICD-10-CM | POA: Diagnosis present

## 2016-05-29 DIAGNOSIS — Z79899 Other long term (current) drug therapy: Secondary | ICD-10-CM | POA: Insufficient documentation

## 2016-05-29 DIAGNOSIS — I120 Hypertensive chronic kidney disease with stage 5 chronic kidney disease or end stage renal disease: Secondary | ICD-10-CM | POA: Diagnosis not present

## 2016-05-29 DIAGNOSIS — Y69 Unspecified misadventure during surgical and medical care: Secondary | ICD-10-CM | POA: Diagnosis not present

## 2016-05-29 DIAGNOSIS — N186 End stage renal disease: Secondary | ICD-10-CM | POA: Diagnosis not present

## 2016-05-29 DIAGNOSIS — T82590A Other mechanical complication of surgically created arteriovenous fistula, initial encounter: Secondary | ICD-10-CM | POA: Diagnosis not present

## 2016-05-29 DIAGNOSIS — Z992 Dependence on renal dialysis: Secondary | ICD-10-CM | POA: Diagnosis not present

## 2016-05-29 DIAGNOSIS — I770 Arteriovenous fistula, acquired: Secondary | ICD-10-CM

## 2016-05-29 NOTE — ED Provider Notes (Signed)
[facility] DEPT Provider Note   CSN: [id number] Arrival date & time: 05/29/16  1318  First Provider Contact:  First MD Initiated Contact with Patient 05/29/16 1409        History   Chief Complaint Chief Complaint  Patient presents with  . Arm Pain    HPI Terrance Reynolds is a 35 y.o. male.  Patient is a dialysis patient. Normally dialyzed at the [facility] Tuesday Thursdays Saturdays. Patient status post revision of his left arm AV fistula 2 weeks ago by vascular surgery at [facility]. Patient is concerned with some increased swelling and discomfort in that area. Patient is currently being dialyzed through external catheter that's in his right upper chest area. Patient did go to dialysis today. They were willing to dialyze him they were not concerned about the AV fistula. However patient opted not to be dialyzed. Patient without any shortness of breath. He was dialyzed completely on Thursday.       Past Medical History:  Diagnosis Date  . Acute on chronic renal failure (HCC)    ESRD TThSAT  . Anemia   . Anxiety   . Arthritis   . CKD (chronic kidney disease)   . Constipation   . Dialysis patient ([place])   . Diastolic dysfunction 0000000.   Grade 2 per echo  . GERD (gastroesophageal reflux disease)   . Headache    Migraines- due to headache  . Hepatitis 2016   hepatitis c  . Hypertension   . 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 Migraines   . Psoriasis   . Shortness of breath dyspnea    when I had a lot of fluid on  . Substance abuse    methadone clinic  . Thrombocytopenia [facility])     Patient Active Problem List   Diagnosis Date Noted  . Numbness-Left wrist 07/03/2015  . ESRD (end stage renal disease) ([place])   . Renal failure 04/14/2015  . Chronic kidney disease, stage V ([place]) 04/14/2015  . Metabolic acidosis 0000000  . Pulmonary vascular congestion 04/14/2015  . Bilateral leg edema 04/14/2015  . 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 Diastolic dysfunction 0000000  . Headache   . Hepatitis C 04/02/2015  . Acute renal failure ([place]) 04/01/2015  . Acute on chronic renal failure ([place]) 04/01/2015  . Hypertensive crisis 04/01/2015  . Elevated troponin 04/01/2015  . Dyspnea 04/01/2015  . Leg edema 04/01/2015  . Anemia 04/01/2015  . Thrombocytopenia ([place]) 04/01/2015  . Substance abuse 04/01/2015    Past Surgical History:  Procedure Laterality Date  . AV FISTULA PLACEMENT Left 06/18/2015   Procedure: LEFT RADIOCEPHALIC ARTERIOVENOUS (AV) FISTULA CREATION;  Surgeon: Elam Dutch, MD;  Location: [place];  Service: Vascular;  Laterality: Left;  . AV FISTULA PLACEMENT Left 12/12/2015   Procedure: INSERTION OF ARTERIOVENOUS (AV) GORE-TEX GRAFT [place]  ( BRACH/AXILLARY GRAFT );  Surgeon: Katha Cabal, MD;  Location: [facility] ORS;  Service: Vascular;  Laterality: Left;  . BASCILIC VEIN TRANSPOSITION Left 07/09/2015   Procedure: LEFT BRACHIOCEPHALIC ARTERIOVENOUS FISTULA;  Surgeon: Elam Dutch, MD;  Location: [place];  Service: Vascular;  Laterality: Left;  . CAPD INSERTION N/A 12/12/2015   Procedure: Removal of peritoneal dialysis catheter;  Surgeon: Katha Cabal, MD;  Location: [facility] ORS;  Service: Vascular;  Laterality: N/A;  . HEMATOMA EVACUATION Left 05/14/2016   Procedure: EVACUATION HEMATOMA ( DRAINAGE );  Surgeon: Katha Cabal, MD;  Location: [facility]  ORS;  Service: Vascular;  Laterality: Left;  . INSERTION OF DIALYSIS CATHETER N/A 09/03/2015   Procedure: INSERTION OF PERITONEAL DIALYSIS CATHETER;  Surgeon: Katha Cabal, MD;  Location: [facility] ORS;  Service: Vascular;  Laterality: N/A;  . PERIPHERAL VASCULAR CATHETERIZATION Left 04/23/2016   Procedure: A/V Shuntogram/Fistulagram;  Surgeon: Katha Cabal, MD;  Location: [place] CV LAB;  Service: Cardiovascular;  Laterality: Left;  . PERIPHERAL VASCULAR CATHETERIZATION N/A 04/23/2016    Procedure: A/V Shunt Intervention;  Surgeon: Katha Cabal, MD;  Location: [place] CV [facility];  Service: Cardiovascular;  Laterality: N/A;  . permacath Right   . REVISION OF ARTERIOVENOUS GORETEX GRAFT Left 05/14/2016   Procedure: REVISION OF ARTERIOVENOUS GORETEX GRAFT ( DECLOT );  Surgeon: Katha Cabal, MD;  Location: [facility] ORS;  Service: Vascular;  Laterality: Left;       Home Medications    Prior to Admission medications   Medication Sig Start Date End Date Taking? Authorizing Provider  amLODipine (NORVASC) 5 MG tablet Take 5 mg by mouth daily.   Yes Historical Provider, MD  calcitRIOL (ROCALTROL) 0.5 MCG capsule Take 0.5 mcg by mouth daily.   Yes Historical Provider, MD  furosemide (LASIX) 80 MG tablet Take 80 mg by mouth daily.    Yes Historical Provider, MD  hydrALAZINE (APRESOLINE) 50 MG tablet Take 1 tablet (50 mg total) by mouth every 8 (eight) hours. 04/17/15  Yes Lezlie Octave Black, NP  labetalol (NORMODYNE) 300 MG tablet Take 1 tablet (300 mg total) by mouth 2 (two) times daily. 04/17/15  Yes Lezlie Octave Black, NP  METHADONE HCL PO Take 100 mg by mouth daily.   Yes Historical Provider, MD  Multiple Vitamin (MULTIVITAMIN WITH MINERALS) TABS tablet Take 1 tablet by mouth daily.   Yes Historical Provider, MD  oxyCODONE-acetaminophen (ROXICET) 5-325 MG tablet Take 1-2 tablets by mouth every 6 (six) hours as needed for moderate pain or severe pain. 05/14/16  Yes Katha Cabal, MD  sevelamer carbonate (RENVELA) 800 MG tablet Take 800-1,600 mg by mouth 3 (three) times daily with meals. Take 2 tablets with meals, and 1 tablet with snacks   Yes Historical Provider, MD  triamcinolone cream (KENALOG) 0.1 % Apply 1 application topically 3 (three) times a week.   Yes Historical Provider, MD    Family History Family History  Problem Relation Age of Onset  . Diabetes Mother   . Heart disease Father   . Hypertension Father     Social History Social History  Substance Use Topics  .  Smoking status: Current Every Day Smoker    Packs/day: 1.00    Years: 10.00    Types: Cigarettes  . Smokeless tobacco: Never Used     Comment: 10 years  off and on  . Alcohol use No     Allergies   Penicillins   Review of Systems Review of Systems  Constitutional: Negative for fever.  HENT: Negative for congestion.   Respiratory: Negative for shortness of breath.   Cardiovascular: Negative for chest pain.  Gastrointestinal: Negative for abdominal pain.  Musculoskeletal: Negative for neck pain.  Skin: Positive for wound. Negative for rash.  Neurological: Negative for headaches.  Hematological: Bruises/bleeds easily.  Psychiatric/Behavioral: Negative for confusion.     Physical Exam Updated Vital Signs BP 153/83 (BP Location: Left Arm)   Pulse 85   Temp 98.7 F (37.1 C) (Oral)   Resp 19   SpO2 98%   Physical Exam  Constitutional: He is oriented to  person, place, and time. He appears well-developed and well-nourished. No distress.  HENT:  Head: Normocephalic and atraumatic.  Eyes: EOM are normal. Pupils are equal, round, and reactive to light.  Neck: Normal range of motion. Neck supple.  Cardiovascular: Normal rate and regular rhythm.   Pulmonary/Chest: Effort normal and breath sounds normal. No respiratory distress. He has no rales.  Abdominal: Soft.  Musculoskeletal:  AV fistula left arm. Recent revision. Wound healing well. Good thrill good pulse. May be some slight edema towards the antecubital aspect of it no red streaking. No purulent discharge no significant tenderness.  Neurological: He is oriented to person, place, and time. No cranial nerve deficit. He exhibits normal muscle tone. Coordination normal.  Skin: Skin is warm.  Nursing note and vitals reviewed.    ED Treatments / Results  Labs (all labs ordered are listed, but only abnormal results are displayed) Labs Reviewed - No data to display  EKG  EKG Interpretation None       Radiology No  results found.  Procedures Procedures (including critical care time)  Medications Ordered in ED Medications - No data to display   Initial Impression / Assessment and Plan / ED Course  I have reviewed the triage vital signs and the nursing notes.  Pertinent labs & imaging results that were available during my care of the patient were reviewed by me and considered in my medical decision making (see chart for details).  Clinical Course    Patient status post revision of left arm AV fistula by vascular surgery at [facility]. Patient receives dialysis here. The AV fistulas not being used for dialysis yet. The revision was 2 weeks ago. Patient is concerned that it has some evidence of some swelling and some mild discomfort. Patient denied any fevers. Is no evidence of redness to suggest an infection. Patient is nontoxic no acute distress. Patient normally dialyzed Tuesdays Thursdays Saturdays they went to dialysis today they offered to continue to dialyze him through his port that they're using they weren't overly concerned about the AV fistula the patient did not want to be dialyzed so he came here. CT no reason for an acute referral to vascular surgery at this time. Would recommend a follow-up with the vascular surgery group at [facility] on Monday or Tuesday. Recommended he continue dialysis.  Final Clinical Impressions(s) / ED Diagnoses   Final diagnoses:  A-V fistula [facility])    New Prescriptions New Prescriptions   No medications on file     Fredia Sorrow, MD 05/29/16 1601

## 2016-05-29 NOTE — ED Triage Notes (Signed)
Complain of pain and swelling to left arm from dialysis shunt being placed

## 2016-05-29 NOTE — Discharge Instructions (Signed)
Recommend following up with your vascular surgeon at [facility] on Monday or Tuesday. Get seen tomorrow if you develop fevers or lose any spreading of redness or any new or worse symptoms develop. Again would recommend getting seen at the emergency department  ensure vascular surgery group is there.

## 2016-06-03 ENCOUNTER — Other Ambulatory Visit: Payer: Self-pay | Admitting: Vascular Surgery

## 2016-06-08 ENCOUNTER — Ambulatory Visit
Admission: RE | Admit: 2016-06-08 | Discharge: 2016-06-08 | Disposition: A | Discharge status: Home / Self Care | Payer: Medicaid Other | Source: Ambulatory Visit | Attending: Vascular Surgery | Admitting: Vascular Surgery

## 2016-06-08 ENCOUNTER — Encounter
Admission: RE | Disposition: A | Discharge status: Home / Self Care | Payer: Self-pay | Source: Ambulatory Visit | Attending: Vascular Surgery

## 2016-06-08 DIAGNOSIS — F17210 Nicotine dependence, cigarettes, uncomplicated: Secondary | ICD-10-CM | POA: Diagnosis not present

## 2016-06-08 DIAGNOSIS — F419 Anxiety disorder, unspecified: Secondary | ICD-10-CM | POA: Insufficient documentation

## 2016-06-08 DIAGNOSIS — I132 Hypertensive heart and chronic kidney disease with heart failure and with stage 5 chronic kidney disease, or end stage renal disease: Secondary | ICD-10-CM | POA: Diagnosis not present

## 2016-06-08 DIAGNOSIS — Z8619 Personal history of other infectious and parasitic diseases: Secondary | ICD-10-CM | POA: Diagnosis not present

## 2016-06-08 DIAGNOSIS — Z833 Family history of diabetes mellitus: Secondary | ICD-10-CM | POA: Diagnosis not present

## 2016-06-08 DIAGNOSIS — I5030 Unspecified diastolic (congestive) heart failure: Secondary | ICD-10-CM | POA: Insufficient documentation

## 2016-06-08 DIAGNOSIS — L409 Psoriasis, unspecified: Secondary | ICD-10-CM | POA: Diagnosis not present

## 2016-06-08 DIAGNOSIS — M1990 Unspecified osteoarthritis, unspecified site: Secondary | ICD-10-CM | POA: Diagnosis not present

## 2016-06-08 DIAGNOSIS — Y832 Surgical operation with anastomosis, bypass or graft as the cause of abnormal reaction of the patient, or of later complication, without mention of misadventure at the time of the procedure: Secondary | ICD-10-CM | POA: Diagnosis not present

## 2016-06-08 DIAGNOSIS — D696 Thrombocytopenia, unspecified: Secondary | ICD-10-CM | POA: Insufficient documentation

## 2016-06-08 DIAGNOSIS — Z8249 Family history of ischemic heart disease and other diseases of the circulatory system: Secondary | ICD-10-CM | POA: Insufficient documentation

## 2016-06-08 DIAGNOSIS — Z880 Allergy status to penicillin: Secondary | ICD-10-CM | POA: Diagnosis not present

## 2016-06-08 DIAGNOSIS — G43909 Migraine, unspecified, not intractable, without status migrainosus: Secondary | ICD-10-CM | POA: Diagnosis not present

## 2016-06-08 DIAGNOSIS — R0602 Shortness of breath: Secondary | ICD-10-CM | POA: Insufficient documentation

## 2016-06-08 DIAGNOSIS — K219 Gastro-esophageal reflux disease without esophagitis: Secondary | ICD-10-CM | POA: Insufficient documentation

## 2016-06-08 DIAGNOSIS — F1110 Opioid abuse, uncomplicated: Secondary | ICD-10-CM | POA: Diagnosis not present

## 2016-06-08 DIAGNOSIS — N186 End stage renal disease: Secondary | ICD-10-CM | POA: Insufficient documentation

## 2016-06-08 DIAGNOSIS — T82868A Thrombosis of vascular prosthetic devices, implants and grafts, initial encounter: Secondary | ICD-10-CM | POA: Diagnosis present

## 2016-06-08 DIAGNOSIS — Z992 Dependence on renal dialysis: Secondary | ICD-10-CM | POA: Diagnosis not present

## 2016-06-08 HISTORY — PX: PERIPHERAL VASCULAR CATHETERIZATION: SHX172C

## 2016-06-08 LAB — POTASSIUM (ARMC VASCULAR LAB ONLY): Potassium (ARMC vascular lab): 4.6 (ref 3.5–5.1)

## 2016-06-08 SURGERY — A/V SHUNTOGRAM/FISTULAGRAM
Anesthesia: Moderate Sedation | Site: Arm Upper | Laterality: Right

## 2016-06-08 MED ORDER — CLINDAMYCIN PHOSPHATE 300 MG/50ML IV SOLN
300.0000 mg | Freq: Once | INTRAVENOUS | Status: AC
  Administered 2016-06-08: 300 mg via INTRAVENOUS

## 2016-06-08 MED ORDER — FAMOTIDINE 20 MG PO TABS: 40.0000 mg | ORAL_TABLET | ORAL | Status: DC | PRN

## 2016-06-08 MED ORDER — PHENOL 1.4 % MT LIQD
1.0000 | OROMUCOSAL | Status: DC | PRN
  Filled 2016-06-08: qty 177

## 2016-06-08 MED ORDER — POTASSIUM CHLORIDE CRYS ER 20 MEQ PO TBCR: 20.0000 meq | EXTENDED_RELEASE_TABLET | Freq: Every day | ORAL | Status: DC | PRN

## 2016-06-08 MED ORDER — ACETAMINOPHEN 325 MG RE SUPP
325.0000 mg | RECTAL | Status: DC | PRN
  Filled 2016-06-08: qty 2

## 2016-06-08 MED ORDER — METHYLPREDNISOLONE SODIUM SUCC 125 MG IJ SOLR: 125.0000 mg | INTRAMUSCULAR | Status: DC | PRN

## 2016-06-08 MED ORDER — CLINDAMYCIN HCL 300 MG PO CAPS: 300.0000 mg | ORAL_CAPSULE | Freq: Three times a day (TID) | ORAL | 1 refills | Status: DC

## 2016-06-08 MED ORDER — METOPROLOL TARTRATE 5 MG/5ML IV SOLN: 2.0000 mg | INTRAVENOUS | Status: DC | PRN

## 2016-06-08 MED ORDER — CLINDAMYCIN PHOSPHATE 300 MG/50ML IV SOLN
INTRAVENOUS | Status: AC
  Filled 2016-06-08: qty 50

## 2016-06-08 MED ORDER — HYDROMORPHONE HCL 1 MG/ML IJ SOLN
1.0000 mg | Freq: Once | INTRAMUSCULAR | Status: DC
Start: 2016-06-08 — End: 2016-06-08

## 2016-06-08 MED ORDER — ACETAMINOPHEN 325 MG PO TABS: 325.0000 mg | ORAL_TABLET | ORAL | Status: DC | PRN

## 2016-06-08 MED ORDER — DOCUSATE SODIUM 100 MG PO CAPS: 100.0000 mg | ORAL_CAPSULE | Freq: Every day | ORAL | Status: DC

## 2016-06-08 MED ORDER — LIDOCAINE HCL (PF) 1 % IJ SOLN
INTRAMUSCULAR | Status: AC
  Filled 2016-06-08: qty 30

## 2016-06-08 MED ORDER — LABETALOL HCL 5 MG/ML IV SOLN: 10.0000 mg | INTRAVENOUS | Status: DC | PRN

## 2016-06-08 MED ORDER — IOPAMIDOL (ISOVUE-300) INJECTION 61%
INTRAVENOUS | Status: DC | PRN
  Administered 2016-06-08: 5 mL via INTRAVENOUS

## 2016-06-08 MED ORDER — HYDRALAZINE HCL 20 MG/ML IJ SOLN: 5.0000 mg | INTRAMUSCULAR | Status: DC | PRN

## 2016-06-08 MED ORDER — GUAIFENESIN-DM 100-10 MG/5ML PO SYRP: 15.0000 mL | ORAL_SOLUTION | ORAL | Status: DC | PRN

## 2016-06-08 MED ORDER — HEPARIN SODIUM (PORCINE) 1000 UNIT/ML IJ SOLN
INTRAMUSCULAR | Status: AC
  Filled 2016-06-08: qty 1

## 2016-06-08 MED ORDER — ONDANSETRON HCL 4 MG/2ML IJ SOLN: 4.0000 mg | Freq: Four times a day (QID) | INTRAMUSCULAR | Status: DC | PRN

## 2016-06-08 MED ORDER — SODIUM CHLORIDE 0.9 % IV SOLN
INTRAVENOUS | Status: DC
  Administered 2016-06-08: 13:00:00 via INTRAVENOUS

## 2016-06-08 MED ORDER — FENTANYL CITRATE (PF) 100 MCG/2ML IJ SOLN
INTRAMUSCULAR | Status: AC
  Filled 2016-06-08: qty 2

## 2016-06-08 MED ORDER — MAGNESIUM SULFATE 2 GM/50ML IV SOLN: 2.0000 g | Freq: Every day | INTRAVENOUS | Status: DC | PRN

## 2016-06-08 MED ORDER — HEPARIN (PORCINE) IN NACL 2-0.9 UNIT/ML-% IJ SOLN
INTRAMUSCULAR | Status: AC
  Filled 2016-06-08: qty 1000

## 2016-06-08 MED ORDER — SODIUM CHLORIDE 0.9 % IV SOLN: 500.0000 mL | Freq: Once | INTRAVENOUS | Status: DC | PRN

## 2016-06-08 MED ORDER — MIDAZOLAM HCL 2 MG/2ML IJ SOLN
INTRAMUSCULAR | Status: DC | PRN
  Administered 2016-06-08: 3 mg via INTRAVENOUS

## 2016-06-08 MED ORDER — PANTOPRAZOLE SODIUM 40 MG PO TBEC: 40.0000 mg | DELAYED_RELEASE_TABLET | Freq: Every day | ORAL | Status: DC

## 2016-06-08 MED ORDER — FENTANYL CITRATE (PF) 100 MCG/2ML IJ SOLN
INTRAMUSCULAR | Status: DC | PRN
  Administered 2016-06-08: 50 ug via INTRAVENOUS

## 2016-06-08 MED ORDER — ALUM & MAG HYDROXIDE-SIMETH 200-200-20 MG/5ML PO SUSP: 15.0000 mL | ORAL | Status: DC | PRN

## 2016-06-08 MED ORDER — MIDAZOLAM HCL 5 MG/5ML IJ SOLN
INTRAMUSCULAR | Status: AC
  Filled 2016-06-08: qty 5

## 2016-06-08 SURGICAL SUPPLY — 5 items
DRAPE BRACHIAL (DRAPES) ×4 IMPLANT
PACK ANGIOGRAPHY (CUSTOM PROCEDURE TRAY) ×4 IMPLANT
SET INTRO CAPELLA COAXIAL (SET/KITS/TRAYS/PACK) ×4 IMPLANT
SHEATH BRITE TIP 6FRX5.5 (SHEATH) ×4 IMPLANT
TOWEL OR 17X26 4PK STRL BLUE (TOWEL DISPOSABLE) ×4 IMPLANT

## 2016-06-08 NOTE — Op Note (Signed)
OPERATIVE NOTE   PROCEDURE: 1. Contrast injection left arm brachial axillary dialysis graft  PRE-OPERATIVE DIAGNOSIS: Complication of dialysis access                                                       End Stage Renal Disease  POST-OPERATIVE DIAGNOSIS: same as above   SURGEON: Katha Cabal, M.D.  ANESTHESIA: Conscious sedation was administered under my direct supervision. IV Versed plus fentanyl were utilized. Continuous ECG, pulse oximetry and blood pressure was monitored throughout the entire procedure.  Conscious sedation was for a total of 20 minutes.  ESTIMATED BLOOD LOSS: minimal  FINDING(S): 1. AV graft is thrombosed, at the area of previous revision near the arterial anastomosis there is evidence of skin breakdown concerning for infection  SPECIMEN(S):  None  CONTRAST: 5 cc  FLUOROSCOPY TIME: 0.1 minutes  INDICATIONS: Terrance Reynolds is a 35 y.o. male who  presents with malfunctioning left arm AV access.  The patient is scheduled for angiography with possible intervention of the AV access.  The patient is aware the risks include but are not limited to: bleeding, infection, thrombosis of the cannulated access, and possible anaphylactic reaction to the contrast.  The patient acknowledges if the access can not be salvaged a tunneled catheter will be needed and will be placed during this procedure.  The patient is aware of the risks of the procedure and elects to proceed with the angiogram and intervention.  DESCRIPTION: After full informed written consent was obtained, the patient was brought back to the Special Procedure suite and placed supine position.  Appropriate cardiopulmonary monitors were placed.  The left arm was prepped and draped in the standard fashion.  Appropriate timeout is called. The left brachial axillary AV graft  was cannulated with a micropuncture needle. Ultrasound was placed in a sterile sleeve ultrasound was utilized secondary lack of appropriate  landmarks and to avoid vascular injury. Image was recorded for the permanent record.  The microwire was advanced and the needle was exchanged for  a microsheath.  The J-wire was then advanced and a 6 Fr sheath inserted.  Hand injections were completed to image the access from the arterial anastomosis through the entire access.  The central venous structures were also imaged by hand injections.  Based on the images,  AV graft is thrombosed given the suspicion that the graft may be infected attempts at thrombectomy from a percutaneous approach are not undertaken. Consideration for open revision versus excision of the graft.    A 4-0 Monocryl purse-string suture was sewn around the sheath.  The sheath was removed and light pressure was applied.  A sterile bandage was applied to the puncture site.    COMPLICATIONS: None  CONDITION: Unchanged  Katha Cabal, M.D [facility] Office: [phone]  06/08/2016 3:55 PM

## 2016-06-08 NOTE — H&P (Signed)
[facility] Admission History & Physical  MRN : [id number]  Terrance Reynolds is a 35 y.o. (18-Dec-1980) male who presents with chief complaint of my dialysis access is not working well.  History of Present Illness: Patient is sent by his dialysis center for evaluation of his left arm brachial axillary dialysis graft. Patient has had several complications including a hematoma formation in early July 2017. Lately, he has been having increasing problems with cannulation as well as poor flow. He is sent by the nephrologist at the Center for evaluation to salvage his arm graft.  Patient denies fever or chills. He denies hand pain or symptoms consistent with steal.  Current Facility-Administered Medications  Medication Dose Route Frequency Provider Last Rate Last Dose  . clindamycin (CLEOCIN) 300 MG/50ML IVPB           . 0.9 %  sodium chloride infusion   Intravenous Continuous Kimberly A Stegmayer, PA-C 10 mL/hr at 06/08/16 1305    . clindamycin (CLEOCIN) IVPB 300 mg  300 mg Intravenous Once [employer], PA-C      . famotidine (PEPCID) tablet 40 mg  40 mg Oral PRN Janalyn Harder Stegmayer, PA-C      . HYDROmorphone (DILAUDID) injection 1 mg  1 mg Intravenous Once [employer], PA-C      . methylPREDNISolone sodium succinate (SOLU-MEDROL) 125 mg/2 mL injection 125 mg  125 mg Intravenous PRN Kimberly A Stegmayer, PA-C      . ondansetron (ZOFRAN) injection 4 mg  4 mg Intravenous Q6H PRN Sela Hua, PA-C        Past Medical History:  Diagnosis Date  . Acute on chronic renal failure (HCC)    ESRD TThSAT  . Anemia   . Anxiety   . Arthritis   . CKD (chronic kidney disease)   . Constipation   . Dialysis patient ([place])   . Diastolic dysfunction 0000000.   Grade 2 per echo  . GERD (gastroesophageal reflux disease)   . Headache    Migraines- due to headache  . Hepatitis 2016   hepatitis c  . Hypertension   . 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 Migraines   . Psoriasis   . Shortness of breath dyspnea    when I had a lot of fluid on  . Substance abuse    methadone clinic  . Thrombocytopenia ([place])     Past Surgical History:  Procedure Laterality Date  . AV FISTULA PLACEMENT Left 06/18/2015   Procedure: LEFT RADIOCEPHALIC ARTERIOVENOUS (AV) FISTULA CREATION;  Surgeon: Elam Dutch, MD;  Location: [facility];  Service: Vascular;  Laterality: Left;  . AV FISTULA PLACEMENT Left 12/12/2015   Procedure: INSERTION OF ARTERIOVENOUS (AV) GORE-TEX GRAFT [place]  ( BRACH/AXILLARY GRAFT );  Surgeon: Katha Cabal, MD;  Location: [facility];  Service: Vascular;  Laterality: Left;  . BASCILIC VEIN TRANSPOSITION Left 07/09/2015   Procedure: LEFT BRACHIOCEPHALIC ARTERIOVENOUS FISTULA;  Surgeon: Elam Dutch, MD;  Location: [place];  Service: Vascular;  Laterality: Left;  . CAPD INSERTION N/A 12/12/2015   Procedure: Removal of peritoneal dialysis catheter;  Surgeon: Katha Cabal, MD;  Location: [facility];  Service: Vascular;  Laterality: N/A;  . HEMATOMA EVACUATION Left 05/14/2016   Procedure: EVACUATION HEMATOMA ( DRAINAGE );  Surgeon: Katha Cabal, MD;  Location: [facility];  Service: Vascular;  Laterality: Left;  . INSERTION OF DIALYSIS CATHETER N/A 09/03/2015   Procedure: INSERTION OF PERITONEAL DIALYSIS CATHETER;  Surgeon: Katha Cabal, MD;  Location: [facility];  Service: Vascular;  Laterality: N/A;  . PERIPHERAL VASCULAR CATHETERIZATION Left 04/23/2016   Procedure: A/V Shuntogram/Fistulagram;  Surgeon: Katha Cabal, MD;  Location: [facility];  Service: Cardiovascular;  Laterality: Left;  . PERIPHERAL VASCULAR CATHETERIZATION N/A 04/23/2016   Procedure: A/V Shunt Intervention;  Surgeon: Katha Cabal, MD;  Location: [facility];  Service: Cardiovascular;  Laterality: N/A;  . permacath Right   . REVISION OF ARTERIOVENOUS GORETEX GRAFT Left 05/14/2016   Procedure: REVISION OF ARTERIOVENOUS GORETEX  GRAFT ( DECLOT );  Surgeon: Katha Cabal, MD;  Location: [facility];  Service: Vascular;  Laterality: Left;    Social History Social History  Substance Use Topics  . Smoking status: Current Every Day Smoker    Packs/day: 1.00    Years: 10.00    Types: Cigarettes  . Smokeless tobacco: Never Used     Comment: 10 years  off and on  . Alcohol use No    Family History Family History  Problem Relation Age of Onset  . Diabetes Mother   . Heart disease Father   . Hypertension Father   No family history of bleeding clotting disorders, autoimmune disease or porphyria  Allergies: Penicillin          anaphylaxis  REVIEW OF SYSTEMS (Negative unless checked)  Constitutional: [] Weight loss  [] Fever  [] Chills Cardiac: [] Chest pain   [] Chest pressure   [] Palpitations   [] Shortness of breath when laying flat   [] Shortness of breath at rest   [] Shortness of breath with exertion. Vascular:  [] Pain in legs with walking   [] Pain in legs at rest   [] Pain in legs when laying flat   [] Claudication   [] Pain in feet when walking  [] Pain in feet at rest  [] Pain in feet when laying flat   [] History of DVT   [] Phlebitis   [] Swelling in legs   [] Varicose veins   [] Non-healing ulcers Pulmonary:   [] Uses home oxygen   [] Productive cough   [] Hemoptysis   [] Wheeze  [] COPD   [] Asthma Neurologic:  [] Dizziness  [] Blackouts   [] Seizures   [] History of stroke   [] History of TIA  [] Aphasia   [] Temporary blindness   [] Dysphagia   [] Weakness or numbness in arms   [] Weakness or numbness in legs Musculoskeletal:  [] Arthritis   [] Joint swelling   [] Joint pain   [] Low back pain Hematologic:  [] Easy bruising  [] Easy bleeding   [] Hypercoagulable state   [] Anemic  [] Hepatitis Gastrointestinal:  [] Blood in stool   [] Vomiting blood  [] Gastroesophageal reflux/heartburn   [] Difficulty swallowing. Genitourinary:  [x] Chronic kidney disease   [] Difficult urination  [] Frequent urination  [] Burning with urination   [] Blood in  urine Skin:  [] Rashes   [] Ulcers   [] Wounds Psychological:  [] History of anxiety   []  History of major depression.  Physical Examination  Vitals:   06/08/16 1245  BP: 137/85  Pulse: 79  Resp: 19  Temp: 98.3 F (36.8 C)  TempSrc: Oral  SpO2: 100%  Weight: 81.6 kg (180 lb)  Height: 6\' 2"  (1.88 m)   Body mass index is 23.11 kg/m. Gen: WD/WN, NAD Head: [place]/AT, No temporalis wasting.  Ear/Nose/Throat: Hearing grossly intact, nares w/o erythema or drainage, oropharynx w/o Erythema/Exudate, Eyes: PERRLA, EOMI.  Neck: Supple, no nuchal rigidity.  No bruit or JVD.  Pulmonary:  Good air movement, clear to auscultation bilaterally, no increased work of respiration or use of accessory muscles  Cardiac: RRR, normal S1, S2, no Murmurs, rubs or gallops. Vascular: Left arm AV graft with moderate pulsatility and a staccato thrill Vessel Right Left  Radial Palpable Palpable  Ulnar Not Palpable Not Palpable  Brachial Palpable Palpable  Carotid Palpable, without bruit Palpable, without bruit                       Gastrointestinal: soft, non-tender/non-distended. No guarding/reflex. No masses, surgical incisions, or scars. Musculoskeletal: M/S 5/5 throughout.  No deformity or atrophy. Neurologic: CN 2-12 intact. Pain and light touch intact in extremities.  Symmetrical.  Speech is fluent. Motor exam as listed above. Psychiatric: Judgment intact, Mood & affect appropriate for pt's clinical situation. Dermatologic: No rashes or ulcers noted.  No cellulitis or open wounds. Lymph : No Cervical, Axillary, or Inguinal lymphadenopathy.   CBC Lab Results  Component Value Date   WBC 6.9 05/13/2016   HGB 10.2 (L) 05/14/2016   HCT 30.0 (L) 05/14/2016   MCV 83.9 05/13/2016   PLT 361 05/13/2016    BMET    Component Value Date/Time   NA 139 05/14/2016 1012   K 4.2 05/14/2016 1012   CL 101 05/13/2016 0911   CO2 27 05/13/2016 0911   GLUCOSE 94 05/14/2016 1012   BUN 37 (H) 05/13/2016 0911    CREATININE 5.26 (H) 05/13/2016 0911   CALCIUM 8.8 (L) 05/13/2016 0911   CALCIUM 8.2 (L) 04/02/2015 0425   GFRNONAA 13 (L) 05/13/2016 0911   GFRAA 15 (L) 05/13/2016 0911   CrCl cannot be calculated (Patient's most recent lab result is older than the maximum 21 days allowed.).  COAG Lab Results  Component Value Date   INR 1.05 05/13/2016   INR 1.00 12/04/2015   INR 1.08 04/15/2015    Radiology Dg C-arm 1-60 Min-no Report  Result Date: 05/14/2016 CLINICAL DATA: Clot in fistula C-ARM 1-60 MINUTES Fluoroscopy was utilized by the requesting physician.  No radiographic interpretation.    Assessment/Plan 1.  Complication dialysis device with poor function of his AV access:  Patient's left arm brachial axillary graft is functioning poorly. The patient will undergo angiography and salvage of his graft using interventional techniques. Potassium will be drawn to ensure that it is an appropriate level prior to performing thrombectomy. 2.  End-stage renal disease requiring hemodialysis:  Patient will continue dialysis therapy without further interruption.   Dialysis has already been arranged since the patient missed their previous session 3.  Hypertension:  Patient will continue medical management; nephrology is following no changes in oral medications. 4. Thrombocytopenia:  His platelet count will be checked  Yesena Reaves, Dolores Lory, MD  06/08/2016 1:25 PM

## 2016-06-08 NOTE — Discharge Instructions (Signed)
fistulogram Fistulogram, Care After Refer to this sheet in the next few weeks. These instructions provide you with information on caring for yourself after your procedure. Your health care provider may also give you more specific instructions. Your treatment has been planned according to current medical practices, but problems sometimes occur. Call your health care provider if you have any problems or questions after your procedure. WHAT TO EXPECT AFTER THE PROCEDURE After your procedure, it is typical to have the following:  A small amount of discomfort in the area where the catheters were placed.  A small amount of bruising around the fistula.  Sleepiness and fatigue. HOME CARE INSTRUCTIONS  Rest at home for the day following your procedure.  Do not drive or operate heavy machinery while taking pain medicine.  Take medicines only as directed by your health care provider.  Do not take baths, swim, or use a hot tub until your health care provider approves. You may shower 24 hours after the procedure or as directed by your health care provider.  There are many different ways to close and cover an incision, including stitches, skin glue, and adhesive strips. Follow your health care provider's instructions on:  Incision care.  Bandage (dressing) changes and removal.  Incision closure removal.  Monitor your dialysis fistula carefully. SEEK MEDICAL CARE IF:  You have drainage, redness, swelling, or pain at your catheter site.  You have a fever.  You have chills. SEEK IMMEDIATE MEDICAL CARE IF:  You feel weak.  You have trouble balancing.  You have trouble moving your arms or legs.  You have problems with your speech or vision.  You can no longer feel a vibration or buzz when you put your fingers over your dialysis fistula.  The limb that was used for the procedure:  Swells.  Is painful.  Is cold.  Is discolored, such as blue or pale white.   This information is  not intended to replace advice given to you by your health care provider. Make sure you discuss any questions you have with your health care provider.   Document Released: 02/18/2014 Document Reviewed: 02/18/2014 Elsevier Interactive Patient Education [employer].

## 2016-06-08 NOTE — Progress Notes (Signed)
MEDICATION RELATED CONSULT NOTE - INITIAL   Pharmacy Consult for antibiotic renal dose Indication: antibiotics  Allergies: Penicillins (swelling of throat as a child)  Patient Measurements: Height: 6\' 2"  (188 cm) Weight: 180 lb (81.6 kg) IBW/kg (Calculated) : 82.2   Vital Signs: Temp: 98.3 F (36.8 C) (08/22 1245) Temp Source: Oral (08/22 1245) BP: 134/76 (08/22 1632) Pulse Rate: 79 (08/22 1615) Intake/Output from previous day: No intake/output data recorded. Intake/Output from this shift: No intake/output data recorded.  Labs: No results for input(s): WBC, HGB, HCT, PLT, APTT, CREATININE, LABCREA, CREATININE, CREAT24HRUR, MG, PHOS, ALBUMIN, PROT, ALBUMIN, AST, ALT, ALKPHOS, BILITOT, BILIDIR, IBILI in the last 72 hours. CrCl cannot be calculated (Patient's most recent lab result is older than the maximum 21 days allowed.).   Microbiology: Recent Results (from the past 720 hour(s))  Surgical pcr screen     Status: None   Collection Time: 05/13/16  9:11 AM  Result Value Ref Range Status   MRSA, PCR NEGATIVE NEGATIVE Final   Staphylococcus aureus NEGATIVE NEGATIVE Final    Comment:        The Xpert SA Assay (FDA approved for NASAL specimens in patients over 36 years of age), is one component of a comprehensive surveillance program.  Test performance has been validated by [facility] for patients greater than or equal to 40 year old. It is not intended to diagnose infection nor to guide or monitor treatment.   Aerobic/Anaerobic Culture (surgical/deep wound)     Status: None   Collection Time: 05/14/16 12:57 PM  Result Value Ref Range Status   Specimen Description TISSUE LEFT ARM  Final   Special Requests POF CLEOCIN 300MG   Final   Gram Stain   Final    RARE WBC PRESENT, PREDOMINANTLY PMN NO ORGANISMS SEEN    Culture   Final    RARE VIRIDANS STREPTOCOCCUS NO ANAEROBES ISOLATED Performed at [facility]    Report Status 05/19/2016 FINAL  Final   Organism ID, Bacteria VIRIDANS STREPTOCOCCUS  Final      Susceptibility   Viridans streptococcus - MIC*    ERYTHROMYCIN <=0.12 SENSITIVE Sensitive     LEVOFLOXACIN 1 SENSITIVE Sensitive     VANCOMYCIN 0.5 SENSITIVE Sensitive     * RARE VIRIDANS STREPTOCOCCUS    Medical History: Past Medical History:  Diagnosis Date  . Acute on chronic renal failure (HCC)    ESRD TThSAT  . Anemia   . Anxiety   . Arthritis   . CKD (chronic kidney disease)   . Constipation   . Dialysis patient ([place])   . Diastolic dysfunction 0000000.   Grade 2 per echo  . GERD (gastroesophageal reflux disease)   . Headache    Migraines- due to headache  . Hepatitis 2016   hepatitis c  . Hypertension   . 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 Migraines   . Psoriasis   . Shortness of breath dyspnea    when I had a lot of fluid on  . Substance abuse    methadone clinic  . Thrombocytopenia (HCC)     Medications:  Scheduled:  . clindamycin      . [START ON 06/09/2016] docusate sodium  100 mg Oral Daily  .  HYDROmorphone (DILAUDID) injection  1 mg Intravenous Once  . pantoprazole  40 mg Oral Daily    Assessment: 35yo male who presents to the ED on [id number] due to complications of dialysis access.   Goal of Therapy:  Monitor for  signs and symptoms of adverse effects.   Plan:  Patient will be started on clindamycin tomorrow. No renal dose adjustments necessary in ESRD with clindamycin.   Loree Fee 06/08/2016,4:36 PM

## 2016-06-09 ENCOUNTER — Encounter: Payer: Self-pay | Admitting: Vascular Surgery

## 2016-06-15 ENCOUNTER — Other Ambulatory Visit: Payer: Self-pay | Admitting: Vascular Surgery

## 2016-06-24 ENCOUNTER — Encounter
Admission: RE | Admit: 2016-06-24 | Discharge: 2016-06-24 | Disposition: A | Discharge status: Home / Self Care | Payer: Medicaid Other | Source: Ambulatory Visit | Attending: Vascular Surgery | Admitting: Vascular Surgery

## 2016-06-24 DIAGNOSIS — Z01818 Encounter for other preprocedural examination: Secondary | ICD-10-CM | POA: Insufficient documentation

## 2016-06-24 LAB — BASIC METABOLIC PANEL
ANION GAP: 11 (ref 5–15)
BUN: 50 mg/dL — AB (ref 6–20)
CHLORIDE: 101 mmol/L (ref 101–111)
CO2: 26 mmol/L (ref 22–32)
Calcium: 9.1 mg/dL (ref 8.9–10.3)
Creatinine, Ser: 6.22 mg/dL — ABNORMAL HIGH (ref 0.61–1.24)
GFR calc Af Amer: 12 mL/min — ABNORMAL LOW (ref >60)
GFR, EST NON AFRICAN AMERICAN: 10 mL/min — AB (ref >60)
GLUCOSE: 99 mg/dL (ref 65–99)
POTASSIUM: 4.3 mmol/L (ref 3.5–5.1)
SODIUM: 138 mmol/L (ref 135–145)

## 2016-06-24 LAB — CBC WITH DIFFERENTIAL/PLATELET
BASOS ABS: 0.1 10*3/uL (ref 0–0.1)
Basophils Relative: 1 %
EOS PCT: 4 %
Eosinophils Absolute: 0.3 10*3/uL (ref 0–0.7)
HEMATOCRIT: 26.2 % — AB (ref 40.0–52.0)
Hemoglobin: 8.9 g/dL — ABNORMAL LOW (ref 13.0–18.0)
LYMPHS ABS: 2 10*3/uL (ref 1.0–3.6)
LYMPHS PCT: 24 %
MCH: 28.3 pg (ref 26.0–34.0)
MCHC: 34.1 g/dL (ref 32.0–36.0)
MCV: 83 fL (ref 80.0–100.0)
Monocytes Absolute: 0.6 10*3/uL (ref 0.2–1.0)
Monocytes Relative: 7 %
NEUTROS ABS: 5.4 10*3/uL (ref 1.4–6.5)
Neutrophils Relative %: 64 %
PLATELETS: 452 10*3/uL — AB (ref 150–440)
RBC: 3.16 MIL/uL — AB (ref 4.40–5.90)
RDW: 14.7 % — ABNORMAL HIGH (ref 11.5–14.5)
WBC: 8.4 10*3/uL (ref 3.8–10.6)

## 2016-06-24 LAB — PROTIME-INR
INR: 1.14
Prothrombin Time: 14.7 seconds (ref 11.4–15.2)

## 2016-06-24 LAB — APTT: aPTT: 47 seconds — ABNORMAL HIGH (ref 24–36)

## 2016-06-24 NOTE — Patient Instructions (Signed)
  Your procedure is scheduled on: Wed. 06/30/16 Report to Day Surgery. To find out your arrival time please call [phone] between 1PM - 3PM on Tues. 06/29/16.  Remember: Instructions that are not followed completely may result in serious medical risk, up to and including death, or upon the discretion of your surgeon and anesthesiologist your surgery may need to be rescheduled.    _x___ 1. Do not eat food or drink liquids after midnight. No gum chewing or hard candies.     ___x_ 2. No Alcohol for 24 hours before or after surgery.   ____ 3. Do Not Smoke For 24 Hours Prior to Your Surgery.   ____ 4. Bring all medications with you on the day of surgery if instructed.    __x__ 5. Notify your doctor if there is any change in your medical condition     (cold, fever, infections).       Do not wear jewelry, make-up, hairpins, clips or nail polish.  Do not wear lotions, powders, or perfumes. You may wear deodorant.  Do not shave 48 hours prior to surgery. Men may shave face and neck.  Do not bring valuables to the hospital.    [facility] is not responsible for any belongings or valuables.               Contacts, dentures or bridgework may not be worn into surgery.  Leave your suitcase in the car. After surgery it may be brought to your room.  For patients admitted to the hospital, discharge time is determined by your                treatment team.   Patients discharged the day of surgery will not be allowed to drive home.   Please read over the following fact sheets that you were given:   Surgical Site Infection Prevention   __x__ Take these medicines the morning of surgery with A SIP OF WATER:    1. amLODipine (NORVASC) 5 MG tablet  2. hydrALAZINE (APRESOLINE) 50 MG tablet  3. labetalol (NORMODYNE) 300 MG tablet  4.METHADONE HCL PO  5.  6.  ____ Fleet Enema (as directed)   __x__ Use CHG Soap as directed  ____ Use inhalers on the day of surgery  ____ Stop metformin 2 days  prior to surgery    ____ Take 1/2 of usual insulin dose the night before surgery and none on the morning of surgery.   ____ Stop Coumadin/Plavix/aspirin on  __x__ Stop Anti-inflammatories on Tylenol only until surgery   ____ Stop supplements until after surgery.    ____ Bring C-Pap to the hospital.

## 2016-06-30 ENCOUNTER — Ambulatory Visit: Payer: Medicaid Other | Admitting: Certified Registered Nurse Anesthetist

## 2016-06-30 ENCOUNTER — Observation Stay
Admission: RE | Admit: 2016-06-30 | Discharge: 2016-07-01 | Disposition: A | Discharge status: Home / Self Care | Payer: Medicaid Other | Source: Ambulatory Visit | Attending: Vascular Surgery | Admitting: Vascular Surgery

## 2016-06-30 ENCOUNTER — Encounter: Payer: Self-pay | Admitting: *Deleted

## 2016-06-30 ENCOUNTER — Encounter
Admission: RE | Disposition: A | Discharge status: Home / Self Care | Payer: Self-pay | Source: Ambulatory Visit | Attending: Vascular Surgery

## 2016-06-30 DIAGNOSIS — Z992 Dependence on renal dialysis: Secondary | ICD-10-CM | POA: Diagnosis not present

## 2016-06-30 DIAGNOSIS — N2581 Secondary hyperparathyroidism of renal origin: Secondary | ICD-10-CM | POA: Diagnosis not present

## 2016-06-30 DIAGNOSIS — F1110 Opioid abuse, uncomplicated: Secondary | ICD-10-CM | POA: Insufficient documentation

## 2016-06-30 DIAGNOSIS — Z8249 Family history of ischemic heart disease and other diseases of the circulatory system: Secondary | ICD-10-CM | POA: Insufficient documentation

## 2016-06-30 DIAGNOSIS — T827XXA Infection and inflammatory reaction due to other cardiac and vascular devices, implants and grafts, initial encounter: Principal | ICD-10-CM | POA: Insufficient documentation

## 2016-06-30 DIAGNOSIS — B1920 Unspecified viral hepatitis C without hepatic coma: Secondary | ICD-10-CM | POA: Diagnosis not present

## 2016-06-30 DIAGNOSIS — I132 Hypertensive heart and chronic kidney disease with heart failure and with stage 5 chronic kidney disease, or end stage renal disease: Secondary | ICD-10-CM | POA: Insufficient documentation

## 2016-06-30 DIAGNOSIS — Z880 Allergy status to penicillin: Secondary | ICD-10-CM | POA: Diagnosis not present

## 2016-06-30 DIAGNOSIS — F419 Anxiety disorder, unspecified: Secondary | ICD-10-CM | POA: Diagnosis not present

## 2016-06-30 DIAGNOSIS — D696 Thrombocytopenia, unspecified: Secondary | ICD-10-CM | POA: Diagnosis not present

## 2016-06-30 DIAGNOSIS — D631 Anemia in chronic kidney disease: Secondary | ICD-10-CM | POA: Diagnosis not present

## 2016-06-30 DIAGNOSIS — Z833 Family history of diabetes mellitus: Secondary | ICD-10-CM | POA: Insufficient documentation

## 2016-06-30 DIAGNOSIS — N186 End stage renal disease: Secondary | ICD-10-CM | POA: Diagnosis not present

## 2016-06-30 DIAGNOSIS — X58XXXA Exposure to other specified factors, initial encounter: Secondary | ICD-10-CM | POA: Insufficient documentation

## 2016-06-30 DIAGNOSIS — G43909 Migraine, unspecified, not intractable, without status migrainosus: Secondary | ICD-10-CM | POA: Insufficient documentation

## 2016-06-30 DIAGNOSIS — L409 Psoriasis, unspecified: Secondary | ICD-10-CM | POA: Insufficient documentation

## 2016-06-30 DIAGNOSIS — I5032 Chronic diastolic (congestive) heart failure: Secondary | ICD-10-CM | POA: Insufficient documentation

## 2016-06-30 DIAGNOSIS — K219 Gastro-esophageal reflux disease without esophagitis: Secondary | ICD-10-CM | POA: Insufficient documentation

## 2016-06-30 DIAGNOSIS — M1990 Unspecified osteoarthritis, unspecified site: Secondary | ICD-10-CM | POA: Diagnosis not present

## 2016-06-30 DIAGNOSIS — F17210 Nicotine dependence, cigarettes, uncomplicated: Secondary | ICD-10-CM | POA: Diagnosis not present

## 2016-06-30 DIAGNOSIS — T829XXA Unspecified complication of cardiac and vascular prosthetic device, implant and graft, initial encounter: Secondary | ICD-10-CM | POA: Diagnosis present

## 2016-06-30 HISTORY — PX: AVGG REMOVAL: SHX5153

## 2016-06-30 LAB — POCT I-STAT 4, (NA,K, GLUC, HGB,HCT)
Glucose, Bld: 98 mg/dL (ref 65–99)
HCT: 27 % — ABNORMAL LOW (ref 39.0–52.0)
Hemoglobin: 9.2 g/dL — ABNORMAL LOW (ref 13.0–17.0)
POTASSIUM: 4.6 mmol/L (ref 3.5–5.1)
SODIUM: 140 mmol/L (ref 135–145)

## 2016-06-30 LAB — URINE DRUG SCREEN, QUALITATIVE (ARMC ONLY)
Amphetamines, Ur Screen: NOT DETECTED
BARBITURATES, UR SCREEN: NOT DETECTED
Benzodiazepine, Ur Scrn: NOT DETECTED
CANNABINOID 50 NG, UR ~~LOC~~: NOT DETECTED
Cocaine Metabolite,Ur ~~LOC~~: NOT DETECTED
MDMA (ECSTASY) UR SCREEN: NOT DETECTED
Methadone Scn, Ur: POSITIVE — AB
Opiate, Ur Screen: NOT DETECTED
Phencyclidine (PCP) Ur S: NOT DETECTED
TRICYCLIC, UR SCREEN: NOT DETECTED

## 2016-06-30 LAB — GLUCOSE, CAPILLARY: GLUCOSE-CAPILLARY: 79 mg/dL (ref 65–99)

## 2016-06-30 SURGERY — REMOVAL OF ARTERIOVENOUS GORETEX GRAFT (AVGG)
Anesthesia: General | Site: Arm Upper | Laterality: Left | Wound class: Clean Contaminated

## 2016-06-30 MED ORDER — ONDANSETRON HCL 4 MG/2ML IJ SOLN: 4.0000 mg | Freq: Four times a day (QID) | INTRAMUSCULAR | Status: DC | PRN

## 2016-06-30 MED ORDER — LABETALOL HCL 5 MG/ML IV SOLN
INTRAVENOUS | Status: AC
Start: 2016-06-30 — End: 2016-06-30
  Administered 2016-06-30: 5 mg via INTRAVENOUS
  Filled 2016-06-30: qty 4

## 2016-06-30 MED ORDER — FENTANYL CITRATE (PF) 100 MCG/2ML IJ SOLN
INTRAMUSCULAR | Status: DC | PRN
  Administered 2016-06-30: 50 ug via INTRAVENOUS
  Administered 2016-06-30 (×2): 25 ug via INTRAVENOUS

## 2016-06-30 MED ORDER — AMLODIPINE BESYLATE 5 MG PO TABS: 5.0000 mg | ORAL_TABLET | Freq: Every day | ORAL | Status: DC

## 2016-06-30 MED ORDER — LABETALOL HCL 5 MG/ML IV SOLN
INTRAVENOUS | Status: AC
  Administered 2016-06-30: 5 mg via INTRAVENOUS
  Filled 2016-06-30: qty 4

## 2016-06-30 MED ORDER — LIDOCAINE HCL (CARDIAC) 20 MG/ML IV SOLN
INTRAVENOUS | Status: DC | PRN
  Administered 2016-06-30: 60 mg via INTRAVENOUS

## 2016-06-30 MED ORDER — SODIUM CHLORIDE 0.9 % IV SOLN
INTRAVENOUS | Status: DC
  Administered 2016-06-30: 12:00:00 via INTRAVENOUS

## 2016-06-30 MED ORDER — METHADONE HCL 40 MG PO TBSO: 100.0000 mg | ORAL_TABLET | Freq: Every day | ORAL | Status: DC

## 2016-06-30 MED ORDER — SODIUM CHLORIDE FLUSH 0.9 % IV SOLN
INTRAVENOUS | Status: AC
  Filled 2016-06-30: qty 6

## 2016-06-30 MED ORDER — DOCUSATE SODIUM 100 MG PO CAPS
100.0000 mg | ORAL_CAPSULE | Freq: Every day | ORAL | Status: DC
  Administered 2016-07-01: 100 mg via ORAL
  Filled 2016-06-30: qty 1

## 2016-06-30 MED ORDER — CHLORHEXIDINE GLUCONATE CLOTH 2 % EX PADS: 6.0000 | MEDICATED_PAD | Freq: Once | CUTANEOUS | Status: DC

## 2016-06-30 MED ORDER — BUPIVACAINE HCL (PF) 0.5 % IJ SOLN
INTRAMUSCULAR | Status: DC | PRN
  Administered 2016-06-30: 15 mL

## 2016-06-30 MED ORDER — PROPOFOL 10 MG/ML IV BOLUS
INTRAVENOUS | Status: DC | PRN
  Administered 2016-06-30: 200 mg via INTRAVENOUS

## 2016-06-30 MED ORDER — ACETAMINOPHEN 650 MG RE SUPP: 325.0000 mg | RECTAL | Status: DC | PRN

## 2016-06-30 MED ORDER — FAMOTIDINE 20 MG PO TABS
ORAL_TABLET | ORAL | Status: AC
  Administered 2016-06-30: 20 mg via ORAL
  Filled 2016-06-30: qty 1

## 2016-06-30 MED ORDER — OXYCODONE HCL 5 MG PO TABS
5.0000 mg | ORAL_TABLET | ORAL | Status: DC | PRN
  Administered 2016-06-30: 10 mg via ORAL
  Filled 2016-06-30: qty 2

## 2016-06-30 MED ORDER — BUPIVACAINE HCL (PF) 0.5 % IJ SOLN
INTRAMUSCULAR | Status: AC
  Filled 2016-06-30: qty 30

## 2016-06-30 MED ORDER — CLINDAMYCIN PHOSPHATE 300 MG/50ML IV SOLN
300.0000 mg | Freq: Three times a day (TID) | INTRAVENOUS | Status: AC
  Administered 2016-06-30: 300 mg via INTRAVENOUS
  Filled 2016-06-30: qty 50

## 2016-06-30 MED ORDER — FUROSEMIDE 80 MG PO TABS
80.0000 mg | ORAL_TABLET | Freq: Every day | ORAL | Status: DC
  Filled 2016-06-30 (×2): qty 1

## 2016-06-30 MED ORDER — LABETALOL HCL 300 MG PO TABS
300.0000 mg | ORAL_TABLET | Freq: Two times a day (BID) | ORAL | Status: DC
  Administered 2016-06-30: 300 mg via ORAL
  Filled 2016-06-30 (×2): qty 1

## 2016-06-30 MED ORDER — METHADONE HCL 10 MG PO TABS
100.0000 mg | ORAL_TABLET | Freq: Every day | ORAL | Status: DC
  Administered 2016-06-30: 100 mg via ORAL

## 2016-06-30 MED ORDER — MIDAZOLAM HCL 5 MG/5ML IJ SOLN
INTRAMUSCULAR | Status: DC | PRN
  Administered 2016-06-30: 2 mg via INTRAVENOUS

## 2016-06-30 MED ORDER — ADULT MULTIVITAMIN W/MINERALS CH
1.0000 | ORAL_TABLET | Freq: Every day | ORAL | Status: DC
  Administered 2016-07-01: 1 via ORAL
  Filled 2016-06-30: qty 1

## 2016-06-30 MED ORDER — FENTANYL CITRATE (PF) 100 MCG/2ML IJ SOLN
25.0000 ug | INTRAMUSCULAR | Status: DC | PRN
  Administered 2016-06-30 (×4): 25 ug via INTRAVENOUS

## 2016-06-30 MED ORDER — HEPARIN SODIUM (PORCINE) 5000 UNIT/ML IJ SOLN
INTRAMUSCULAR | Status: AC
  Filled 2016-06-30: qty 1

## 2016-06-30 MED ORDER — ALUM & MAG HYDROXIDE-SIMETH 200-200-20 MG/5ML PO SUSP: 15.0000 mL | ORAL | Status: DC | PRN

## 2016-06-30 MED ORDER — ACETAMINOPHEN 325 MG PO TABS: 325.0000 mg | ORAL_TABLET | ORAL | Status: DC | PRN

## 2016-06-30 MED ORDER — ONDANSETRON HCL 4 MG/2ML IJ SOLN
INTRAMUSCULAR | Status: DC | PRN
  Administered 2016-06-30: 4 mg via INTRAVENOUS

## 2016-06-30 MED ORDER — NALOXONE HCL 0.4 MG/ML IJ SOLN
INTRAMUSCULAR | Status: DC | PRN
  Administered 2016-06-30: .1 ug via INTRAVENOUS

## 2016-06-30 MED ORDER — LABETALOL HCL 5 MG/ML IV SOLN
5.0000 mg | INTRAVENOUS | Status: AC | PRN
  Administered 2016-06-30 (×2): 5 mg via INTRAVENOUS

## 2016-06-30 MED ORDER — HYDRALAZINE HCL 50 MG PO TABS
50.0000 mg | ORAL_TABLET | Freq: Three times a day (TID) | ORAL | Status: DC
  Administered 2016-06-30 – 2016-07-01 (×2): 50 mg via ORAL
  Filled 2016-06-30 (×3): qty 1

## 2016-06-30 MED ORDER — PANTOPRAZOLE SODIUM 40 MG PO TBEC
40.0000 mg | DELAYED_RELEASE_TABLET | Freq: Every day | ORAL | Status: DC
  Administered 2016-06-30 – 2016-07-01 (×2): 40 mg via ORAL
  Filled 2016-06-30 (×2): qty 1

## 2016-06-30 MED ORDER — CLINDAMYCIN PHOSPHATE 300 MG/50ML IV SOLN
300.0000 mg | Freq: Once | INTRAVENOUS | Status: AC
  Administered 2016-06-30: 300 mg via INTRAVENOUS

## 2016-06-30 MED ORDER — FAMOTIDINE 20 MG PO TABS
20.0000 mg | ORAL_TABLET | Freq: Once | ORAL | Status: AC
  Administered 2016-06-30: 20 mg via ORAL

## 2016-06-30 MED ORDER — ONDANSETRON HCL 4 MG/2ML IJ SOLN: 4.0000 mg | Freq: Once | INTRAMUSCULAR | Status: DC | PRN

## 2016-06-30 MED ORDER — LABETALOL HCL 5 MG/ML IV SOLN
5.0000 mg | INTRAVENOUS | Status: DC | PRN
  Administered 2016-06-30 (×8): 5 mg via INTRAVENOUS

## 2016-06-30 MED ORDER — CLINDAMYCIN PHOSPHATE 300 MG/50ML IV SOLN
INTRAVENOUS | Status: AC
  Administered 2016-06-30: 300 mg via INTRAVENOUS
  Filled 2016-06-30: qty 50

## 2016-06-30 MED ORDER — SODIUM CHLORIDE 0.9 % IV SOLN
INTRAVENOUS | Status: DC | PRN
  Administered 2016-06-30: 100 mL via INTRAMUSCULAR

## 2016-06-30 MED ORDER — HYDROMORPHONE HCL 1 MG/ML IJ SOLN
0.5000 mg | INTRAMUSCULAR | Status: DC | PRN
Start: 2016-06-30 — End: 2016-07-01

## 2016-06-30 MED ORDER — FENTANYL CITRATE (PF) 100 MCG/2ML IJ SOLN
INTRAMUSCULAR | Status: AC
  Administered 2016-06-30: 25 ug via INTRAVENOUS
  Filled 2016-06-30: qty 2

## 2016-06-30 MED ORDER — SEVELAMER CARBONATE 800 MG PO TABS: 800.0000 mg | ORAL_TABLET | Freq: Every day | ORAL | Status: DC

## 2016-06-30 MED ORDER — SEVELAMER CARBONATE 800 MG PO TABS
1600.0000 mg | ORAL_TABLET | Freq: Three times a day (TID) | ORAL | Status: DC
  Administered 2016-06-30: 1600 mg via ORAL
  Filled 2016-06-30: qty 2

## 2016-06-30 SURGICAL SUPPLY — 66 items
APPLIER CLIP 11 MED OPEN (CLIP)
APPLIER CLIP 9.375 SM OPEN (CLIP)
BAG DECANTER FOR FLEXI CONT (MISCELLANEOUS) ×4 IMPLANT
BLADE SURG 15 STRL LF DISP TIS (BLADE) ×2 IMPLANT
BLADE SURG 15 STRL SS (BLADE) ×2
BLADE SURG SZ11 CARB STEEL (BLADE) ×4 IMPLANT
BOOT SUTURE AID YELLOW STND (SUTURE) ×4 IMPLANT
BRUSH SCRUB 4% CHG (MISCELLANEOUS) ×4 IMPLANT
CANISTER SUCT 1200ML W/VALVE (MISCELLANEOUS) ×4 IMPLANT
CHLORAPREP W/TINT 26ML (MISCELLANEOUS) ×4 IMPLANT
CLIP APPLIE 11 MED OPEN (CLIP) IMPLANT
CLIP APPLIE 9.375 SM OPEN (CLIP) IMPLANT
CNTNR SPEC 2.5X3XGRAD LEK (MISCELLANEOUS) ×2
CONT SPEC 4OZ STER OR WHT (MISCELLANEOUS) ×2
CONTAINER SPEC 2.5X3XGRAD LEK (MISCELLANEOUS) ×2 IMPLANT
DECANTER SPIKE VIAL GLASS SM (MISCELLANEOUS) IMPLANT
DRESSING SURGICEL FIBRLLR 1X2 (HEMOSTASIS) ×2 IMPLANT
DRSG OPSITE POSTOP 4X6 (GAUZE/BANDAGES/DRESSINGS) ×4 IMPLANT
DRSG OPSITE POSTOP 4X8 (GAUZE/BANDAGES/DRESSINGS) ×4 IMPLANT
DRSG SURGICEL FIBRILLAR 1X2 (HEMOSTASIS) ×4
ELECT CAUTERY BLADE 6.4 (BLADE) ×4 IMPLANT
ELECT REM PT RETURN 9FT ADLT (ELECTROSURGICAL) ×4
ELECTRODE REM PT RTRN 9FT ADLT (ELECTROSURGICAL) ×2 IMPLANT
GEL ULTRASOUND 20GR AQUASONIC (MISCELLANEOUS) IMPLANT
GLOVE SURG SYN 8.0 (GLOVE) ×20 IMPLANT
GOWN STRL REUS W/ TWL LRG LVL3 (GOWN DISPOSABLE) ×2 IMPLANT
GOWN STRL REUS W/ TWL XL LVL3 (GOWN DISPOSABLE) ×2 IMPLANT
GOWN STRL REUS W/TWL LRG LVL3 (GOWN DISPOSABLE) ×2
GOWN STRL REUS W/TWL XL LVL3 (GOWN DISPOSABLE) ×2
IV NS 500ML (IV SOLUTION) ×2
IV NS 500ML BAXH (IV SOLUTION) ×2 IMPLANT
KIT RM TURNOVER STRD PROC AR (KITS) ×4 IMPLANT
LABEL OR SOLS (LABEL) ×4 IMPLANT
LIQUID BAND (GAUZE/BANDAGES/DRESSINGS) ×4 IMPLANT
LOOP RED MAXI  1X406MM (MISCELLANEOUS) ×2
LOOP VESSEL MAXI 1X406 RED (MISCELLANEOUS) ×2 IMPLANT
LOOP VESSEL MINI 0.8X406 BLUE (MISCELLANEOUS) ×4 IMPLANT
LOOPS BLUE MINI 0.8X406MM (MISCELLANEOUS) ×4
NEEDLE FILTER BLUNT 18X 1/2SAF (NEEDLE) ×2
NEEDLE FILTER BLUNT 18X1 1/2 (NEEDLE) ×2 IMPLANT
NS IRRIG 500ML POUR BTL (IV SOLUTION) IMPLANT
PACK EXTREMITY ARMC (MISCELLANEOUS) ×4 IMPLANT
PAD PREP 24X41 OB/GYN DISP (PERSONAL CARE ITEMS) ×4 IMPLANT
PATCH CAROTID ECM VASC 1X10 (Prosthesis & Implant Heart) ×4 IMPLANT
SPONGE XRAY 4X4 16PLY STRL (MISCELLANEOUS) ×4 IMPLANT
STOCKINETTE STRL 4IN 9604848 (GAUZE/BANDAGES/DRESSINGS) ×4 IMPLANT
SUT ETHIBOND 0 (SUTURE) ×8 IMPLANT
SUT ETHILON 4-0 (SUTURE) ×6
SUT ETHILON 4-0 FS2 18XMFL BLK (SUTURE) ×6
SUT MNCRL+ 5-0 UNDYED PC-3 (SUTURE) ×2 IMPLANT
SUT MONOCRYL 5-0 (SUTURE) ×2
SUT PROLENE 5 0 RB 1 DA (SUTURE) ×8 IMPLANT
SUT PROLENE 6 0 BV (SUTURE) ×16 IMPLANT
SUT PROLENE 6 0 C 1 30 (SUTURE) ×12 IMPLANT
SUT SILK 2 0 (SUTURE) ×2
SUT SILK 2-0 18XBRD TIE 12 (SUTURE) ×2 IMPLANT
SUT SILK 3 0 (SUTURE) ×2
SUT SILK 3-0 18XBRD TIE 12 (SUTURE) ×2 IMPLANT
SUT SILK 4 0 (SUTURE) ×2
SUT SILK 4-0 18XBRD TIE 12 (SUTURE) ×2 IMPLANT
SUT VIC AB 3-0 SH 27 (SUTURE) ×6
SUT VIC AB 3-0 SH 27X BRD (SUTURE) ×6 IMPLANT
SUTURE ETHLN 4-0 FS2 18XMF BLK (SUTURE) ×6 IMPLANT
SWAB DUAL CULTURE TRANS RED ST (MISCELLANEOUS) ×4 IMPLANT
SYR 20CC LL (SYRINGE) ×4 IMPLANT
SYR 3ML LL SCALE MARK (SYRINGE) ×4 IMPLANT

## 2016-06-30 NOTE — Transfer of Care (Signed)
Immediate Anesthesia Transfer of Care Note  Patient: Terrance Reynolds  Procedure(s) Performed: Procedure(s): REMOVAL OF ARTERIOVENOUS GORETEX GRAFT ([place]) (Left)  Patient Location: PACU  Anesthesia Type:General  Level of Consciousness: sedated  Airway & Oxygen Therapy: Patient Spontanous Breathing and Patient connected to face mask oxygen  Post-op Assessment: Report given to RN and Post -op Vital signs reviewed and stable  Post vital signs: Reviewed and stable  Last Vitals:  Vitals:   06/30/16 1110 06/30/16 1455  BP: (!) 168/110 (!) 153/103  Pulse: 85 82  Resp: 16 (!) 8  Temp: 37.1 C 37.4 C    Last Pain:  Vitals:   06/30/16 1110  TempSrc: Oral  PainSc: 6          Complications: No apparent anesthesia complications

## 2016-06-30 NOTE — Op Note (Signed)
OPERATIVE NOTE   PROCEDURE: 1. Removal of infected left brachial axillary AV graft  PRE-OPERATIVE DIAGNOSIS: Infected left brachial axillary AV graft; end-stage renal disease on hemodialysis  POST-OPERATIVE DIAGNOSIS: Same  SURGEON: Katha Cabal, M.D. ASSISTANT(S): None  ANESTHESIA: general  ESTIMATED BLOOD LOSS: 550 cc  FINDING(S): 1.  Unincorporated PTFE graft  SPECIMEN(S):  AV graft in segments  INDICATIONS:   Terrance Reynolds is a 35 y.o. male who presents with an infected AV access.  Given the prosthetic material this will require excision. The risks and benefits of been reviewed all questions are answered patient has agreed to proceed.  DESCRIPTION: After obtaining full informed written consent, the patient was brought back to the operating room and placed supine upon the operating table.  The patient received IV antibiotics prior to induction.  After obtaining adequate anesthesia, the patient's left arm was prepped and draped in the standard fashion appropriate time out is called.    The previous incisional scar overlying the anastomosis is then re-incised and the dissection is carried down to expose the arterial anastomosis. The native artery is then dissected circumferentially proximally and distally and the suture line is located. In similar fashion the venous anastomosis is exposed.  The graft is then retracted and dissected circumferentially, 0 Ethibond is then used to ligate the graft beyond the anastomosis. Hemostat is then used to control the graft just above the anastomosis and the graft is transected. Final dissection around the artery is then performed. The brachial artery is then clamped proximally and distally. Using a 15 blade scalpel the suture line is incised and the entire prosthetic material is removed from the artery. Cor matrix patch is then rehydrated on the back table trimmed to an appropriate shape and applied to repair the arterial defect using  running 6-0 Prolene. Flushing maneuvers are performed and flow was reestablished distally. Suture line is inspected bleeding points are controlled with interrupted 6-0 Prolene as needed.  Attention is then turned to the venous portion where in similar fashion the graft is retracted oversewn with an 0 Ethibond and then transected. 15 blade scalpel was used to remove the graft at the suture line and subsequently using a 5-0 Prolene the vein is oversewn using a horizontal mattress suture followed by a running over and under. Given the stent which extended more proximally controlling the vein was somewhat difficult and this is the point at which the majority of blood loss occurred. Once the vein had been oversewn with 5-0 Prolene appropriate hemostasis had been assured.   The wound is then irrigated Surgicel is placed and the wound is closed in layers over the repaired vessels using 0 Vicryl followed by 2-0 Vicryl followed by 3-0 Vicryl followed by 4-0 nylon vertical mattress sutures.  Small counter incisions are then made approximately 2 cm distal to the anastomosis the graft is localized and then it is removed using the Ethibond is a marker to ensure all prosthetic material has been retrieved. It is also stripped of the surrounding tissues in the opposite direction and transected as highly as possible. These 2 small incisions are then closed using interrupted 3-0 Vicryl to close the tunnel to achieve hemostasis and attempt to create a barrier to any cross-contamination from the infected portion of the graft. Skin is closed with 4-0 nylon vertical mattress sutures.  The remaining portion of the graft is removed through several latter incisions without difficulty. These small incisions are then irrigated and packed with iodoform gauze. Honeycomb dressings  are applied.   COMPLICATIONS: None  CONDITION: Carlynn Purl, M.D. [facility] Office: [phone]   06/30/2016, 2:54  PM

## 2016-06-30 NOTE — H&P (Signed)
[facility] History & Physical Update  The patient was interviewed and re-examined.  The patient's previous History and Physical has been reviewed and is unchanged.  There is no change in the plan of care. We plan to proceed with the scheduled procedure.  Zulay Corrie, Dolores Lory, MD  06/30/2016, 12:03 PM

## 2016-06-30 NOTE — Anesthesia Preprocedure Evaluation (Signed)
Anesthesia Evaluation  Patient identified by MRN, date of birth, ID band Patient awake    Reviewed: Allergy & Precautions, NPO status , Patient's Chart, lab work & pertinent test results  Airway Mallampati: II       Dental  (+) Teeth Intact   Pulmonary Current Smoker,    breath sounds clear to auscultation       Cardiovascular Exercise Tolerance: Good hypertension, Pt. on medications  Rhythm:Regular Rate:Normal     Neuro/Psych  Headaches, Anxiety    GI/Hepatic GERD  Medicated,(+) Hepatitis -, C  Endo/Other  negative endocrine ROS  Renal/GU CRFRenal disease     Musculoskeletal   Abdominal Normal abdominal exam  (+)   Peds  Hematology  (+) anemia ,   Anesthesia Other Findings   Reproductive/Obstetrics                             Anesthesia Physical Anesthesia Plan  ASA: III  Anesthesia Plan: General   Post-op Pain Management:    Induction: Intravenous  Airway Management Planned: LMA  Additional Equipment:   Intra-op Plan:   Post-operative Plan:   Informed Consent: I have reviewed the patients History and Physical, chart, labs and discussed the procedure including the risks, benefits and alternatives for the proposed anesthesia with the patient or authorized representative who has indicated his/her understanding and acceptance.     Plan Discussed with: CRNA  Anesthesia Plan Comments:         Anesthesia Quick Evaluation

## 2016-06-30 NOTE — Anesthesia Procedure Notes (Signed)
Procedure Name: LMA Insertion Date/Time: 06/30/2016 12:28 PM Performed by: Dionne Bucy Pre-anesthesia Checklist: Patient identified, Patient being monitored, Timeout performed, Emergency Drugs available and Suction available Patient Re-evaluated:Patient Re-evaluated prior to inductionOxygen Delivery Method: Circle system utilized Preoxygenation: Pre-oxygenation with 100% oxygen Intubation Type: IV induction Ventilation: Mask ventilation without difficulty LMA: LMA inserted LMA Size: 5.0 Tube type: Oral Number of attempts: 1 Placement Confirmation: positive ETCO2 and breath sounds checked- equal and bilateral Tube secured with: Tape Dental Injury: Teeth and Oropharynx as per pre-operative assessment

## 2016-07-01 ENCOUNTER — Encounter: Payer: Self-pay | Admitting: Vascular Surgery

## 2016-07-01 DIAGNOSIS — T827XXA Infection and inflammatory reaction due to other cardiac and vascular devices, implants and grafts, initial encounter: Secondary | ICD-10-CM | POA: Diagnosis not present

## 2016-07-01 LAB — GLUCOSE, CAPILLARY: GLUCOSE-CAPILLARY: 69 mg/dL (ref 65–99)

## 2016-07-01 LAB — TYPE AND SCREEN
ABO/RH(D): O POS
ANTIBODY SCREEN: NEGATIVE
UNIT DIVISION: 0
Unit division: 0

## 2016-07-01 LAB — PREPARE RBC (CROSSMATCH)

## 2016-07-01 LAB — SURGICAL PATHOLOGY

## 2016-07-01 NOTE — Consult Note (Signed)
[facility]  CONSULT NOTE    Date: 07/01/2016                  Patient Name:  Terrance Reynolds  MRN: [id number]  DOB: 11-15-1980  Age / Sex: 35 y.o., male         PCP: [facility]                 Service Requesting Consult: Dr. Delana Meyer                 Reason for Consult: End Stage Renal Disease            History of Present Illness: Terrance Reynolds is a 35 y.o. white male with end stage renal disease, substance abuse on methadone, diastolic congestive heart failure, anemia, hypertension, migraines, who was admitted to [facility] on 06/30/2016 for ESRD   Dr. Delana Meyer removed an infected left brachial graft on 9/13. He is asking to get his hemodialysis treatment as an outpatient. He is scheduled for dialysis today. His last hemodialysis was last Thursday due to complications of his dialysis access.    Medications: Outpatient medications: Prescriptions Prior to Admission  Medication Sig Dispense Refill Last Dose  . amLODipine (NORVASC) 5 MG tablet Take 5 mg by mouth daily.   06/30/2016 at 0900  . furosemide (LASIX) 80 MG tablet Take 80 mg by mouth daily.    06/29/2016 at Unknown time  . hydrALAZINE (APRESOLINE) 50 MG tablet Take 1 tablet (50 mg total) by mouth every 8 (eight) hours.   06/30/2016 at 0900  . labetalol (NORMODYNE) 300 MG tablet Take 1 tablet (300 mg total) by mouth 2 (two) times daily.   06/30/2016 at 0900  . METHADONE HCL PO Take 100 mg by mouth daily.   06/30/2016 at 0900  . Multiple Vitamin (MULTIVITAMIN WITH MINERALS) TABS tablet Take 1 tablet by mouth daily.   06/29/2016 at Unknown time  . sevelamer carbonate (RENVELA) 800 MG tablet Take 800-1,600 mg by mouth 3 (three) times daily with meals. Take 2 tablets with meals, and 1 tablet with snacks   06/29/2016 at Unknown time  . triamcinolone cream (KENALOG) 0.1 % Apply 1 application topically 3 (three) times a week.   06/29/2016 at Unknown time    Current  medications: Current Facility-Administered Medications  Medication Dose Route Frequency Provider Last Rate Last Dose  . acetaminophen (TYLENOL) tablet 325-650 mg  325-650 mg Oral Q4H PRN Katha Cabal, MD       Or  . acetaminophen (TYLENOL) suppository 325-650 mg  325-650 mg Rectal Q4H PRN Katha Cabal, MD      . alum & mag hydroxide-simeth (MAALOX/MYLANTA) 200-200-20 MG/5ML suspension 15-30 mL  15-30 mL Oral Q2H PRN Katha Cabal, MD      . amLODipine (NORVASC) tablet 5 mg  5 mg Oral Daily Katha Cabal, MD      . docusate sodium (COLACE) capsule 100 mg  100 mg Oral Daily Katha Cabal, MD   100 mg at 07/01/16 1029  . furosemide (LASIX) tablet 80 mg  80 mg Oral Daily Katha Cabal, MD      . hydrALAZINE (APRESOLINE) tablet 50 mg  50 mg Oral Q8H Katha Cabal, MD   50 mg at 07/01/16 0617  . HYDROmorphone (DILAUDID) injection 0.5-1 mg  0.5-1 mg Intravenous Q2H PRN Katha Cabal, MD      . labetalol (NORMODYNE) tablet 300  mg  300 mg Oral BID Katha Cabal, MD   300 mg at 06/30/16 2115  . methadone (DOLOPHINE) tablet 100 mg  100 mg Oral Daily Katha Cabal, MD   100 mg at 06/30/16 1656  . multivitamin with minerals tablet 1 tablet  1 tablet Oral Daily Katha Cabal, MD   1 tablet at 07/01/16 1028  . ondansetron (ZOFRAN) injection 4 mg  4 mg Intravenous Q6H PRN Katha Cabal, MD      . oxyCODONE (Oxy IR/ROXICODONE) immediate release tablet 5-10 mg  5-10 mg Oral Q4H PRN Katha Cabal, MD   10 mg at 06/30/16 2140  . pantoprazole (PROTONIX) EC tablet 40 mg  40 mg Oral Daily Katha Cabal, MD   40 mg at 07/01/16 1029  . sevelamer carbonate (RENVELA) tablet 1,600 mg  1,600 mg Oral TID WC Katha Cabal, MD   1,600 mg at 06/30/16 1832  . sevelamer carbonate (RENVELA) tablet 800 mg  800 mg Oral Q1500 Katha Cabal, MD          Allergies: Allergies  Allergen Reactions  . Penicillins Anaphylaxis    Child hood allergy, swelling of throat as  a child Has patient had a PCN reaction causing immediate rash, facial/tongue/throat swelling, SOB or lightheadedness with hypotension: unknown Has patient had a PCN reaction causing severe rash involving mucus membranes or skin necrosis: unknown Has patient had a PCN reaction that required hospitalization unknown Has patient had a PCN reaction occurring within the last 10 years:  unknown      Past Medical History: Past Medical History:  Diagnosis Date  . Acute on chronic renal failure (HCC)    ESRD TThSAT  . Anemia   . Anxiety   . Arthritis   . CKD (chronic kidney disease)   . Constipation   . Dialysis patient ([place])   . Diastolic dysfunction 2/59/5638.   Grade 2 per echo  . GERD (gastroesophageal reflux disease)   . Headache    Migraines- due to headache  . Hepatitis 2016   hepatitis c  . Hypertension   . 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 Migraines   . Psoriasis   . Shortness of breath dyspnea    when I had a lot of fluid on  . Substance abuse    methadone clinic  . Thrombocytopenia ([place])      Past Surgical History: Past Surgical History:  Procedure Laterality Date  . AV FISTULA PLACEMENT Left 06/18/2015   Procedure: LEFT RADIOCEPHALIC ARTERIOVENOUS (AV) FISTULA CREATION;  Surgeon: Elam Dutch, MD;  Location: [place];  Service: Vascular;  Laterality: Left;  . AV FISTULA PLACEMENT Left 12/12/2015   Procedure: INSERTION OF ARTERIOVENOUS (AV) GORE-TEX GRAFT [place]  ( BRACH/AXILLARY GRAFT );  Surgeon: Katha Cabal, MD;  Location: [facility];  Service: Vascular;  Laterality: Left;  . BASCILIC VEIN TRANSPOSITION Left 07/09/2015   Procedure: LEFT BRACHIOCEPHALIC ARTERIOVENOUS FISTULA;  Surgeon: Elam Dutch, MD;  Location: [place];  Service: Vascular;  Laterality: Left;  . CAPD INSERTION N/A 12/12/2015   Procedure: Removal of peritoneal dialysis catheter;  Surgeon: Katha Cabal, MD;  Location: [facility];  Service: Vascular;  Laterality: N/A;  . HEMATOMA  EVACUATION Left 05/14/2016   Procedure: EVACUATION HEMATOMA ( DRAINAGE );  Surgeon: Katha Cabal, MD;  Location: [facility];  Service: Vascular;  Laterality: Left;  . INSERTION OF DIALYSIS CATHETER N/A 09/03/2015   Procedure: INSERTION OF PERITONEAL DIALYSIS CATHETER;  Surgeon: Katha Cabal, MD;  Location: [facility];  Service: Vascular;  Laterality: N/A;  . PERIPHERAL VASCULAR CATHETERIZATION Left 04/23/2016   Procedure: A/V Shuntogram/Fistulagram;  Surgeon: Katha Cabal, MD;  Location: [place] [facility];  Service: Cardiovascular;  Laterality: Left;  . PERIPHERAL VASCULAR CATHETERIZATION N/A 04/23/2016   Procedure: A/V Shunt Intervention;  Surgeon: Katha Cabal, MD;  Location: [place] [facility];  Service: Cardiovascular;  Laterality: N/A;  . PERIPHERAL VASCULAR CATHETERIZATION Right 06/08/2016   Procedure: A/V Shuntogram/Fistulagram;  Surgeon: Katha Cabal, MD;  Location: [place] [facility];  Service: Cardiovascular;  Laterality: Right;  . PERIPHERAL VASCULAR CATHETERIZATION N/A 06/08/2016   Procedure: A/V Shunt Intervention;  Surgeon: Katha Cabal, MD;  Location: [place] [facility];  Service: Cardiovascular;  Laterality: N/A;  . permacath Right   . REVISION OF ARTERIOVENOUS GORETEX GRAFT Left 05/14/2016   Procedure: REVISION OF ARTERIOVENOUS GORETEX GRAFT ( DECLOT );  Surgeon: Katha Cabal, MD;  Location: [facility];  Service: Vascular;  Laterality: Left;     Family History: Family History  Problem Relation Age of Onset  . Diabetes Mother   . Heart disease Father   . Hypertension Father      Social History: Social History   Social History  . Marital status: Single    Spouse name: N/A  . Number of children: N/A  . Years of education: N/A   Occupational History  . Not on file.   Social History Main Topics  . Smoking status: Current Every Day Smoker    Packs/day: 0.50    Years: 10.00    Types: Cigarettes  . Smokeless tobacco: Never Used      Comment: 10 years  off and on  . Alcohol use No  . Drug use: No     Comment: former IV drug user  . Sexual activity: Not on file   Other Topics Concern  . Not on file   Social History Narrative  . No narrative on file     Review of Systems: Review of Systems  Constitutional: Negative.   HENT: Negative.   Eyes: Negative.   Respiratory: Negative.   Cardiovascular: Negative.   Gastrointestinal: Negative.   Genitourinary: Negative.   Musculoskeletal: Negative.   Skin: Negative.   Neurological: Negative.   Endo/Heme/Allergies: Negative.   Psychiatric/Behavioral: Negative.     Vital Signs: Blood pressure 132/76, pulse 81, temperature 98.1 F (36.7 C), temperature source Oral, resp. rate 19, height 6\' 2"  (1.88 m), weight 81.6 kg (180 lb), SpO2 95 %.  Weight trends: Filed Weights   06/30/16 1110  Weight: 81.6 kg (180 lb)    Physical Exam: General: NAD, sitting in bed  Head: Normocephalic, atraumatic. Moist oral mucosal membranes  Eyes: Anicteric, PERRL  Neck: Supple, trachea midline  Lungs:  Clear to auscultation  Heart: Regular rate and rhythm  Abdomen:  Soft, nontender,   Extremities: no peripheral edema.  Neurologic: Nonfocal, moving all four extremities  Skin: No lesions  Access: RIJ permcath      Lab results: Basic Metabolic Panel:  Recent Labs Lab 06/24/16 1202 06/30/16 1136  NA 138 140  K 4.3 4.6  CL 101  --   CO2 26  --   GLUCOSE 99 98  BUN 50*  --   CREATININE 6.22*  --   CALCIUM 9.1  --     Liver Function Tests: No results for input(s): AST, ALT, ALKPHOS, BILITOT, PROT, ALBUMIN in the last 168 hours. No  results for input(s): LIPASE, AMYLASE in the last 168 hours. No results for input(s): AMMONIA in the last 168 hours.  CBC:  Recent Labs Lab 06/24/16 1202 06/30/16 1136  WBC 8.4  --   NEUTROABS 5.4  --   HGB 8.9* 9.2*  HCT 26.2* 27.0*  MCV 83.0  --   PLT 452*  --     Cardiac Enzymes: No results for input(s): CKTOTAL, CKMB,  CKMBINDEX, TROPONINI in the last 168 hours.  BNP: Invalid input(s): POCBNP  CBG:  Recent Labs Lab 06/30/16 2050 07/01/16 0730  GLUCAP 79 86    Microbiology: Results for orders placed or performed during the hospital encounter of 06/30/16  Aerobic/Anaerobic Culture (surgical/deep wound)     Status: None (Preliminary result)   Collection Time: 06/30/16  1:37 PM  Result Value Ref Range Status   Specimen Description ARM  Final   Special Requests NONE  Final   Gram Stain   Final    FEW WBC PRESENT, PREDOMINANTLY PMN RARE GRAM NEGATIVE RODS Performed at [facility]    Culture PENDING  Incomplete   Report Status PENDING  Incomplete    Coagulation Studies: No results for input(s): LABPROT, INR in the last 72 hours.  Urinalysis: No results for input(s): COLORURINE, LABSPEC, PHURINE, GLUCOSEU, HGBUR, BILIRUBINUR, KETONESUR, PROTEINUR, UROBILINOGEN, NITRITE, LEUKOCYTESUR in the last 72 hours.  Invalid input(s): APPERANCEUR    Imaging:  No results found.   Assessment & Plan: Terrance Reynolds is a 35 y.o. 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 End Stage Renal Disease: missed over one week of hemodialysis. Needs dialysis today. However he refuses inpatient treatment. We have arranged for outpatient hemodialysis for today. Patient made aware to show up at his outpatient dialysis center: [facility] in [place].  With complication of dialysis access. No AVG removed and using a RIJ permcath  2. Hypertension: difficult to control in past. At goal today.  - furosemide, amlodipine and labetolol  3. Secondary Hyperparathyroidism: - sevelamer with meals.   4. Anemia of chronic kidney disease: hemoglobin 9.2 - epo as outpatient.      LOS: 0 Gowri Suchan,  Maebelle Sulton 9/14/201710:53 AM

## 2016-07-01 NOTE — Anesthesia Postprocedure Evaluation (Signed)
Anesthesia Post Note  Patient: Terrance Reynolds  Procedure(s) Performed: Procedure(s) (LRB): REMOVAL OF ARTERIOVENOUS GORETEX GRAFT ([place]) (Left)  Patient location during evaluation: PACU Anesthesia Type: General Level of consciousness: awake Pain management: pain level not controlled Vital Signs Assessment: post-procedure vital signs reviewed and stable Respiratory status: spontaneous breathing Cardiovascular status: stable Anesthetic complications: no    Last Vitals:  Vitals:   06/30/16 2109 07/01/16 0530  BP: (!) 159/96 132/76  Pulse: 82 81  Resp: 16 19  Temp: 36.6 C 36.7 C    Last Pain:  Vitals:   07/01/16 0530  TempSrc: Oral  PainSc:                  VAN STAVEREN,Rayme Bui

## 2016-07-01 NOTE — Discharge Summary (Signed)
[name]    Discharge Summary    Patient ID:  Terrance Reynolds MRN: [id number] DOB/AGE: 1981/10/05 35 y.o.  Admit date: 06/30/2016 Discharge date: 07/01/2016 Date of Surgery: 06/30/2016 Surgeon: Surgeon(s): Katha Cabal, MD  Admission Diagnosis: ESRD; Infected left arm brachial axillary graft  Discharge Diagnoses:  ESRD; Infected left arm brachial axillary graft  Secondary Diagnoses: Past Medical History:  Diagnosis Date  . Acute on chronic renal failure (HCC)    ESRD TThSAT  . Anemia   . Anxiety   . Arthritis   . CKD (chronic kidney disease)   . Constipation   . Dialysis patient ([place])   . Diastolic dysfunction 2/99/2426.   Grade 2 per echo  . GERD (gastroesophageal reflux disease)   . Headache    Migraines- due to headache  . Hepatitis 2016   hepatitis c  . Hypertension   . 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 Migraines   . Psoriasis   . Shortness of breath dyspnea    when I had a lot of fluid on  . Substance abuse    methadone clinic  . Thrombocytopenia ([place])     Procedure(s): REMOVAL OF ARTERIOVENOUS GORETEX GRAFT ([place])  Discharged Condition: good HPI/Hospital Course: On the day of admission he underwent excision of his graft in the operating room without difficulty.  He was observed over night for pain control and today POD 1 he was fit for DC.  No problems overnight.  Hospital Course:  LEGACY LACIVITA is a 35 y.o. male is S/P left arm graft excision Procedure(s): REMOVAL OF ARTERIOVENOUS GORETEX GRAFT ([place]) Extubated: PO  Physical exam: Dressing intact 2+ edema of the left arm 2+ radial pulse Post-op wounds CD&I Pt. Ambulating, voiding and taking PO diet without difficulty. Pt pain controlled with PO pain meds. Labs as below Complications:none  Consults:  Treatment Team:  Lavonia Dana, MD  Significant Diagnostic Studies: CBC Lab Results  Component Value Date   WBC 8.4 06/24/2016   HGB  9.2 (L) 06/30/2016   HCT 27.0 (L) 06/30/2016   MCV 83.0 06/24/2016   PLT 452 (H) 06/24/2016    BMET    Component Value Date/Time   NA 140 06/30/2016 1136   K 4.6 06/30/2016 1136   CL 101 06/24/2016 1202   CO2 26 06/24/2016 1202   GLUCOSE 98 06/30/2016 1136   BUN 50 (H) 06/24/2016 1202   CREATININE 6.22 (H) 06/24/2016 1202   CALCIUM 9.1 06/24/2016 1202   CALCIUM 8.2 (L) 04/02/2015 0425   GFRNONAA 10 (L) 06/24/2016 1202   GFRAA 12 (L) 06/24/2016 1202   COAG Lab Results  Component Value Date   INR 1.14 06/24/2016   INR 1.05 05/13/2016   INR 1.00 12/04/2015     Disposition:  Discharge to :home Discharge Instructions    Call MD for:  redness, tenderness, or signs of infection (pain, swelling, bleeding, redness, odor or green/yellow discharge around incision site)    Complete by:  As directed    Call MD for:  severe or increased pain, loss or decreased feeling  in affected limb(s)    Complete by:  As directed    Call MD for:  temperature >100.5    Complete by:  As directed    Discharge instructions    Complete by:  As directed    Continue dialysis as scheduled   Driving Restrictions    Complete by:  As directed    OK to drive if  not taking Percocet   Lifting restrictions    Complete by:  As directed    No lifting greater than 15 pounds left arm for 2 weeks  Keep left arm elevated (wrist at shoulder height) as much as possible   Resume previous diet    Complete by:  As directed        Medication List    TAKE these medications   amLODipine 5 MG tablet Commonly known as:  NORVASC Take 5 mg by mouth daily.   furosemide 80 MG tablet Commonly known as:  LASIX Take 80 mg by mouth daily.   hydrALAZINE 50 MG tablet Commonly known as:  APRESOLINE Take 1 tablet (50 mg total) by mouth every 8 (eight) hours.   labetalol 300 MG tablet Commonly known as:  NORMODYNE Take 1 tablet (300 mg total) by mouth 2 (two) times daily.   METHADONE HCL PO Take 100 mg by mouth  daily.   multivitamin with minerals Tabs tablet Take 1 tablet by mouth daily.   sevelamer carbonate 800 MG tablet Commonly known as:  RENVELA Take 800-1,600 mg by mouth 3 (three) times daily with meals. Take 2 tablets with meals, and 1 tablet with snacks   triamcinolone cream 0.1 % Commonly known as:  KENALOG Apply 1 application topically 3 (three) times a week.      Verbal and written Discharge instructions given to the patient. Wound care per Discharge AVS Follow-up Information    Schnier, Dolores Lory, MD Follow up in 2 week(s).   Specialties:  Vascular Surgery, Cardiology, Radiology, Vascular Surgery Why:  no studies Contact information: [place] [address] [phone]           Signed: Katha Cabal, MD  07/01/2016, 9:22 AM

## 2016-07-01 NOTE — Progress Notes (Signed)
Patient alert and oriented, no acute distress noted. Dr. Delana Meyer dischargd patient from his service Discharge instructions given to patient Patient refused dialysis Dr. Abigail Butts notified and spoke to patient prior to discharge. Pateint to go to [facility] for dialysis this afternoon. Dialysis catheter remains in patient right chest. PIV discontinued to patient site clean dry and intact

## 2016-07-05 LAB — AEROBIC/ANAEROBIC CULTURE (SURGICAL/DEEP WOUND)

## 2016-07-05 LAB — AEROBIC/ANAEROBIC CULTURE W GRAM STAIN (SURGICAL/DEEP WOUND)

## 2016-07-06 ENCOUNTER — Encounter (HOSPITAL_COMMUNITY): Payer: Self-pay | Admitting: Emergency Medicine

## 2016-07-06 ENCOUNTER — Emergency Department (HOSPITAL_COMMUNITY)
Admission: EM | Admit: 2016-07-06 | Discharge: 2016-07-06 | Disposition: A | Discharge status: Home / Self Care | Payer: Medicaid Other | Attending: Dermatology | Admitting: Dermatology

## 2016-07-06 DIAGNOSIS — I120 Hypertensive chronic kidney disease with stage 5 chronic kidney disease or end stage renal disease: Secondary | ICD-10-CM | POA: Insufficient documentation

## 2016-07-06 DIAGNOSIS — Z5321 Procedure and treatment not carried out due to patient leaving prior to being seen by health care provider: Secondary | ICD-10-CM | POA: Insufficient documentation

## 2016-07-06 DIAGNOSIS — N186 End stage renal disease: Secondary | ICD-10-CM | POA: Diagnosis not present

## 2016-07-06 DIAGNOSIS — F17210 Nicotine dependence, cigarettes, uncomplicated: Secondary | ICD-10-CM | POA: Insufficient documentation

## 2016-07-06 DIAGNOSIS — Z992 Dependence on renal dialysis: Secondary | ICD-10-CM | POA: Diagnosis not present

## 2016-07-06 LAB — COMPREHENSIVE METABOLIC PANEL
ALK PHOS: 94 U/L (ref 38–126)
ALT: 20 U/L (ref 17–63)
AST: 21 U/L (ref 15–41)
Albumin: 3.5 g/dL (ref 3.5–5.0)
Anion gap: 9 (ref 5–15)
BILIRUBIN TOTAL: 0.5 mg/dL (ref 0.3–1.2)
BUN: 56 mg/dL — AB (ref 6–20)
CALCIUM: 8.4 mg/dL — AB (ref 8.9–10.3)
CHLORIDE: 105 mmol/L (ref 101–111)
CO2: 23 mmol/L (ref 22–32)
CREATININE: 7.74 mg/dL — AB (ref 0.61–1.24)
GFR, EST AFRICAN AMERICAN: 9 mL/min — AB (ref >60)
GFR, EST NON AFRICAN AMERICAN: 8 mL/min — AB (ref >60)
Glucose, Bld: 109 mg/dL — ABNORMAL HIGH (ref 65–99)
Potassium: 4.6 mmol/L (ref 3.5–5.1)
Sodium: 137 mmol/L (ref 135–145)
Total Protein: 7.4 g/dL (ref 6.5–8.1)

## 2016-07-06 LAB — CBC WITH DIFFERENTIAL/PLATELET
Basophils Absolute: 0.1 10*3/uL (ref 0.0–0.1)
Basophils Relative: 1 %
EOS ABS: 0.4 10*3/uL (ref 0.0–0.7)
EOS PCT: 6 %
HEMATOCRIT: 22.9 % — AB (ref 39.0–52.0)
HEMOGLOBIN: 7.2 g/dL — AB (ref 13.0–17.0)
LYMPHS ABS: 1.9 10*3/uL (ref 0.7–4.0)
LYMPHS PCT: 26 %
MCH: 27.7 pg (ref 26.0–34.0)
MCHC: 31.4 g/dL (ref 30.0–36.0)
MCV: 88.1 fL (ref 78.0–100.0)
MONO ABS: 0.5 10*3/uL (ref 0.1–1.0)
MONOS PCT: 6 %
Neutro Abs: 4.4 10*3/uL (ref 1.7–7.7)
Neutrophils Relative %: 61 %
PLATELETS: 327 10*3/uL (ref 150–400)
RBC: 2.6 MIL/uL — AB (ref 4.22–5.81)
RDW: 13.5 % (ref 11.5–15.5)
WBC: 7.2 10*3/uL (ref 4.0–10.5)

## 2016-07-06 NOTE — ED Notes (Signed)
Pt called x3 for room, no answer, will "dismiss" pt from system.

## 2016-07-06 NOTE — ED Notes (Signed)
Pt was called for a recheck of vital signs and pt did not answer to the call.

## 2016-07-06 NOTE — ED Triage Notes (Signed)
Needs dialysis. Pt hasn't had it in 2 weeks. Pt's graft was removed on Wednesday.

## 2016-07-06 NOTE — ED Notes (Signed)
Pt was called for twice now to come back with no answer.

## 2016-07-06 NOTE — ED Triage Notes (Signed)
Pt denies any swelling or SOB

## 2016-07-06 NOTE — ED Notes (Signed)
ekg given to dr. Wyvonnia Dusky

## 2016-07-13 ENCOUNTER — Emergency Department (HOSPITAL_COMMUNITY)
Admission: EM | Admit: 2016-07-13 | Discharge: 2016-07-13 | Disposition: A | Discharge status: Home / Self Care | Payer: Medicaid Other | Attending: Emergency Medicine | Admitting: Emergency Medicine

## 2016-07-13 ENCOUNTER — Encounter (HOSPITAL_COMMUNITY): Payer: Self-pay | Admitting: *Deleted

## 2016-07-13 DIAGNOSIS — I120 Hypertensive chronic kidney disease with stage 5 chronic kidney disease or end stage renal disease: Secondary | ICD-10-CM | POA: Insufficient documentation

## 2016-07-13 DIAGNOSIS — M5441 Lumbago with sciatica, right side: Secondary | ICD-10-CM | POA: Insufficient documentation

## 2016-07-13 DIAGNOSIS — F17210 Nicotine dependence, cigarettes, uncomplicated: Secondary | ICD-10-CM | POA: Insufficient documentation

## 2016-07-13 DIAGNOSIS — N186 End stage renal disease: Secondary | ICD-10-CM | POA: Diagnosis not present

## 2016-07-13 DIAGNOSIS — Z79899 Other long term (current) drug therapy: Secondary | ICD-10-CM | POA: Diagnosis not present

## 2016-07-13 DIAGNOSIS — M545 Low back pain: Secondary | ICD-10-CM | POA: Diagnosis present

## 2016-07-13 DIAGNOSIS — M5431 Sciatica, right side: Secondary | ICD-10-CM

## 2016-07-13 MED ORDER — PREDNISONE 10 MG PO TABS: ORAL_TABLET | ORAL | 0 refills | Status: DC

## 2016-07-13 MED ORDER — OXYCODONE-ACETAMINOPHEN 5-325 MG PO TABS: 1.0000 | ORAL_TABLET | ORAL | 0 refills | Status: DC | PRN

## 2016-07-13 MED ORDER — DEXAMETHASONE SODIUM PHOSPHATE 10 MG/ML IJ SOLN
10.0000 mg | Freq: Once | INTRAMUSCULAR | Status: AC
  Administered 2016-07-13: 10 mg via INTRAMUSCULAR
  Filled 2016-07-13: qty 1

## 2016-07-13 NOTE — ED Provider Notes (Signed)
[facility] DEPT Provider Note   CSN: [id number] Arrival date & time: 07/13/16  1527     History   Chief Complaint Chief Complaint  Patient presents with  . Back Pain    HPI Terrance Reynolds is a 35 y.o. male presenting with acute on chronic low back pain which has which has been worsened for the past week.   Patient denies any new injury specifically.  There is radiation of pain into the right lower extremity.  There has been no weakness or numbness in the lower extremities and no urinary or bowel retention or incontinence.  Patient does not have a history of cancer or IVDU.  The patient takes oxycodone for chronic pain which is not relieving his symptoms. He has a known disk herniation and is waiting for referral to Dr. Carloyn Manner which Dr. Hinda Lenis his dialysis physician is arranging.  He has found no alleviators.  The history is provided by the patient.    Past Medical History:  Diagnosis Date  . Acute on chronic renal failure (HCC)    ESRD TThSAT  . Anemia   . Anxiety   . Arthritis   . CKD (chronic kidney disease)   . Constipation   . Dialysis patient ([place])   . Diastolic dysfunction 9/56/3875.   Grade 2 per echo  . GERD (gastroesophageal reflux disease)   . Headache    Migraines- due to headache  . Hepatitis 2016   hepatitis c  . Hypertension   . 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 Migraines   . Psoriasis   . Shortness of breath dyspnea    when I had a lot of fluid on  . Substance abuse    methadone clinic  . Thrombocytopenia [facility])     Patient Active Problem List   Diagnosis Date Noted  . Complication of vascular access for dialysis ([place]) 06/30/2016  . Numbness-Left wrist 07/03/2015  . ESRD (end stage renal disease) ([place])   . Renal failure 04/14/2015  . Chronic kidney disease, stage V ([place]) 04/14/2015  . Metabolic acidosis 64/33/2951  . Pulmonary vascular congestion 04/14/2015  . Bilateral leg edema 04/14/2015  . 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 Diastolic dysfunction 88/41/6606  . Headache   . Hepatitis C 04/02/2015  . Acute renal failure ([place]) 04/01/2015  . Acute on chronic renal failure ([place]) 04/01/2015  . Hypertensive crisis 04/01/2015  . Elevated troponin 04/01/2015  . Dyspnea 04/01/2015  . Leg edema 04/01/2015  . Anemia 04/01/2015  . Thrombocytopenia ([place]) 04/01/2015  . Substance abuse 04/01/2015    Past Surgical History:  Procedure Laterality Date  . AV FISTULA PLACEMENT Left 06/18/2015   Procedure: LEFT RADIOCEPHALIC ARTERIOVENOUS (AV) FISTULA CREATION;  Surgeon: Elam Dutch, MD;  Location: [facility];  Service: Vascular;  Laterality: Left;  . AV FISTULA PLACEMENT Left 12/12/2015   Procedure: INSERTION OF ARTERIOVENOUS (AV) GORE-TEX GRAFT [place]  ( BRACH/AXILLARY GRAFT );  Surgeon: Katha Cabal, MD;  Location: [facility];  Service: Vascular;  Laterality: Left;  . [place] REMOVAL Left 06/30/2016   Procedure: REMOVAL OF ARTERIOVENOUS GORETEX GRAFT ([place]);  Surgeon: Katha Cabal, MD;  Location: [facility];  Service: Vascular;  Laterality: Left;  . BASCILIC VEIN TRANSPOSITION Left 07/09/2015   Procedure: LEFT BRACHIOCEPHALIC ARTERIOVENOUS FISTULA;  Surgeon: Elam Dutch, MD;  Location: [facility];  Service: Vascular;  Laterality: Left;  . CAPD INSERTION N/A 12/12/2015   Procedure: Removal of peritoneal dialysis  catheter;  Surgeon: Katha Cabal, MD;  Location: [facility];  Service: Vascular;  Laterality: N/A;  . HEMATOMA EVACUATION Left 05/14/2016   Procedure: EVACUATION HEMATOMA ( DRAINAGE );  Surgeon: Katha Cabal, MD;  Location: [facility];  Service: Vascular;  Laterality: Left;  . INSERTION OF DIALYSIS CATHETER N/A 09/03/2015   Procedure: INSERTION OF PERITONEAL DIALYSIS CATHETER;  Surgeon: Katha Cabal, MD;  Location: [facility];  Service: Vascular;  Laterality: N/A;  . PERIPHERAL VASCULAR CATHETERIZATION Left 04/23/2016   Procedure: A/V  Shuntogram/Fistulagram;  Surgeon: Katha Cabal, MD;  Location: [facility];  Service: Cardiovascular;  Laterality: Left;  . PERIPHERAL VASCULAR CATHETERIZATION N/A 04/23/2016   Procedure: A/V Shunt Intervention;  Surgeon: Katha Cabal, MD;  Location: [facility];  Service: Cardiovascular;  Laterality: N/A;  . PERIPHERAL VASCULAR CATHETERIZATION Right 06/08/2016   Procedure: A/V Shuntogram/Fistulagram;  Surgeon: Katha Cabal, MD;  Location: [facility];  Service: Cardiovascular;  Laterality: Right;  . PERIPHERAL VASCULAR CATHETERIZATION N/A 06/08/2016   Procedure: A/V Shunt Intervention;  Surgeon: Katha Cabal, MD;  Location: [facility];  Service: Cardiovascular;  Laterality: N/A;  . permacath Right   . REVISION OF ARTERIOVENOUS GORETEX GRAFT Left 05/14/2016   Procedure: REVISION OF ARTERIOVENOUS GORETEX GRAFT ( [place] );  Surgeon: Katha Cabal, MD;  Location: [facility];  Service: Vascular;  Laterality: Left;       Home Medications    Prior to Admission medications   Medication Sig Start Date End Date Taking? Authorizing Provider  amLODipine (NORVASC) 5 MG tablet Take 5 mg by mouth daily.    Historical Provider, MD  furosemide (LASIX) 80 MG tablet Take 80 mg by mouth daily.     Historical Provider, MD  hydrALAZINE (APRESOLINE) 50 MG tablet Take 1 tablet (50 mg total) by mouth every 8 (eight) hours. 04/17/15   Radene Gunning, NP  labetalol (NORMODYNE) 300 MG tablet Take 1 tablet (300 mg total) by mouth 2 (two) times daily. 04/17/15   Radene Gunning, NP  METHADONE HCL PO Take 100 mg by mouth daily.    Historical Provider, MD  Multiple Vitamin (MULTIVITAMIN WITH MINERALS) TABS tablet Take 1 tablet by mouth daily.    Historical Provider, MD  predniSONE (DELTASONE) 10 MG tablet 6, 5, 4, 3, 2 then 1 tablet by mouth daily for 6 days total. 07/13/16   Evalee Jefferson, PA-C  sevelamer carbonate (RENVELA) 800 MG tablet Take 800-1,600 mg by mouth 3 (three)  times daily with meals. Take 2 tablets with meals, and 1 tablet with snacks    Historical Provider, MD  triamcinolone cream (KENALOG) 0.1 % Apply 1 application topically 3 (three) times a week.    Historical Provider, MD    Family History Family History  Problem Relation Age of Onset  . Diabetes Mother   . Heart disease Father   . Hypertension Father     Social History Social History  Substance Use Topics  . Smoking status: Current Every Day Smoker    Packs/day: 0.50    Years: 10.00    Types: Cigarettes  . Smokeless tobacco: Never Used     Comment: 10 years  off and on  . Alcohol use No     Allergies   Penicillins   Review of Systems Review of Systems  Constitutional: Negative for fever.  Respiratory: Negative for shortness of breath.   Cardiovascular: Negative for chest pain and leg swelling.  Gastrointestinal:  Negative for abdominal distention, abdominal pain and constipation.  Genitourinary: Negative for difficulty urinating, dysuria, flank pain, frequency and urgency.  Musculoskeletal: Positive for back pain. Negative for gait problem and joint swelling.  Skin: Negative for rash.  Neurological: Negative for weakness and numbness.     Physical Exam Updated Vital Signs BP 162/90   Pulse 82   Temp 98.5 F (36.9 C) (Oral)   Resp 18   Ht 6\' 2"  (1.88 m)   Wt 81.6 kg   SpO2 99%   BMI 23.11 kg/m   Physical Exam  Constitutional: He appears well-developed and well-nourished.  HENT:  Head: Normocephalic.  Eyes: Conjunctivae are normal.  Neck: Normal range of motion. Neck supple.  Cardiovascular: Normal rate and intact distal pulses.   Pedal pulses normal.  Pulmonary/Chest: Effort normal.  Abdominal: Soft. Bowel sounds are normal. He exhibits no distension and no mass.  Musculoskeletal: Normal range of motion. He exhibits no edema.       Lumbar back: He exhibits tenderness. He exhibits no swelling, no edema and no spasm.  Neurological: He is alert. He has  normal strength. He displays no atrophy and no tremor. No sensory deficit. Gait normal.  Reflex Scores:      Patellar reflexes are 2+ on the right side and 2+ on the left side.      Achilles reflexes are 2+ on the right side and 2+ on the left side. No strength deficit noted in hip and knee flexor and extensor muscle groups.  Ankle flexion and extension intact.  Skin: Skin is warm and dry.  Psychiatric: He has a normal mood and affect.  Nursing note and vitals reviewed.    ED Treatments / Results  Labs (all labs ordered are listed, but only abnormal results are displayed) Labs Reviewed - No data to display  EKG  EKG Interpretation None       Radiology No results found.  Procedures Procedures (including critical care time)  Medications Ordered in ED Medications  dexamethasone (DECADRON) injection 10 mg (10 mg Intramuscular Given 07/13/16 1721)     Initial Impression / Assessment and Plan / ED Course  I have reviewed the triage vital signs and the nursing notes.  Pertinent labs & imaging results that were available during my care of the patient were reviewed by me and considered in my medical decision making (see chart for details).  Clinical Course    Prednisone taper, continue oxycodone.  F/u with Dr. Carloyn Manner as planned.  No neuro deficit on exam or by history to suggest emergent or surgical presentation.  Discussed worsened sx that should prompt immediate re-evaluation including distal weakness, bowel/bladder retention/incontinence.        Final Clinical Impressions(s) / ED Diagnoses   Final diagnoses:  Sciatica of right side    New Prescriptions Current Discharge Medication List    START taking these medications   Details  predniSONE (DELTASONE) 10 MG tablet 6, 5, 4, 3, 2 then 1 tablet by mouth daily for 6 days total. Qty: 21 tablet, Refills: 0         Evalee Jefferson, PA-C 07/13/16 1744    Duffy Bruce, MD 07/14/16 1148

## 2016-07-13 NOTE — Discharge Instructions (Signed)
Take your next dose of prednisone tomorrow morning.  Take your regularly prescribed oxycodone.   Avoid lifting,  Bending,  Twisting or any other activity that worsens your pain over the next week.  Apply heat therapy to your back 20 minutes several times daily.  You should get rechecked if your symptoms are not better over the next 5 days,  Or you develop increased pain,  Weakness in your leg(s) or loss of bladder or bowel function - these are symptoms of a worse injury.

## 2016-07-13 NOTE — ED Triage Notes (Signed)
Pt report recurrent right lower back pain 10/10 that radiates down right leg.  Pt reports same on previous visit that was treated positively with prednisone.

## 2016-07-19 ENCOUNTER — Emergency Department (HOSPITAL_COMMUNITY)
Admission: EM | Admit: 2016-07-19 | Discharge: 2016-07-19 | Disposition: A | Discharge status: Home / Self Care | Payer: Medicaid Other | Attending: Emergency Medicine | Admitting: Emergency Medicine

## 2016-07-19 ENCOUNTER — Encounter (HOSPITAL_COMMUNITY): Payer: Self-pay

## 2016-07-19 DIAGNOSIS — G8929 Other chronic pain: Secondary | ICD-10-CM | POA: Insufficient documentation

## 2016-07-19 DIAGNOSIS — N186 End stage renal disease: Secondary | ICD-10-CM | POA: Insufficient documentation

## 2016-07-19 DIAGNOSIS — I120 Hypertensive chronic kidney disease with stage 5 chronic kidney disease or end stage renal disease: Secondary | ICD-10-CM | POA: Diagnosis not present

## 2016-07-19 DIAGNOSIS — Z79899 Other long term (current) drug therapy: Secondary | ICD-10-CM | POA: Insufficient documentation

## 2016-07-19 DIAGNOSIS — Z992 Dependence on renal dialysis: Secondary | ICD-10-CM | POA: Insufficient documentation

## 2016-07-19 DIAGNOSIS — F17210 Nicotine dependence, cigarettes, uncomplicated: Secondary | ICD-10-CM | POA: Diagnosis not present

## 2016-07-19 DIAGNOSIS — M545 Low back pain: Secondary | ICD-10-CM

## 2016-07-19 MED ORDER — HYDROCODONE-ACETAMINOPHEN 5-325 MG PO TABS
2.0000 | ORAL_TABLET | Freq: Once | ORAL | Status: DC
Start: 2016-07-19 — End: 2016-07-19
  Filled 2016-07-19: qty 2

## 2016-07-19 MED ORDER — CYCLOBENZAPRINE HCL 5 MG PO TABS: 5.0000 mg | ORAL_TABLET | Freq: Three times a day (TID) | ORAL | 0 refills | Status: DC | PRN

## 2016-07-19 MED ORDER — DIAZEPAM 5 MG PO TABS
5.0000 mg | ORAL_TABLET | Freq: Once | ORAL | Status: DC
  Filled 2016-07-19: qty 1

## 2016-07-19 MED ORDER — DEXAMETHASONE SODIUM PHOSPHATE 10 MG/ML IJ SOLN
10.0000 mg | Freq: Once | INTRAMUSCULAR | Status: AC
  Administered 2016-07-19: 10 mg via INTRAMUSCULAR
  Filled 2016-07-19: qty 1

## 2016-07-19 MED ORDER — CYCLOBENZAPRINE HCL 10 MG PO TABS
5.0000 mg | ORAL_TABLET | Freq: Once | ORAL | Status: AC
  Administered 2016-07-19: 5 mg via ORAL
  Filled 2016-07-19: qty 1

## 2016-07-19 NOTE — ED Triage Notes (Signed)
Pt reports chronic lower back pain.

## 2016-07-19 NOTE — ED Provider Notes (Signed)
[facility] DEPT Provider Note   CSN: [id number] Arrival date & time: 07/19/16  0946  By signing my name below, I, Rayna Sexton, attest that this documentation has been prepared under the direction and in the presence of Elnora Morrison, MD. Electronically Signed: Rayna Sexton, ED Scribe. 07/19/16. 12:10 PM.   History   Chief Complaint Chief Complaint  Patient presents with  . Back Pain    HPI HPI Comments: Terrance Reynolds is a 35 y.o. male with a h/o ESRD on hemodialysis Tues, Thurs, Sat who presents to the Emergency Department complaining of chronic, moderate, diffuse, lower back pain. He states he has a bulging disk in the region. Pt states he has been seen in the past for similar symptoms and during his last visit was given a prednisone taper and an injection and denies long term relief. He has also taken Bayer and Aleve w/o significant relief. His pain worsens with movement and radiates down his legs. He denies a h/o IVDA. He denies a h/o back surgery. Pt denies numbness, urinary issues, fevers, chills or other associated symptoms at this time.    The history is provided by the patient. No language interpreter was used.    Past Medical History:  Diagnosis Date  . Acute on chronic renal failure (HCC)    ESRD TThSAT  . Anemia   . Anxiety   . Arthritis   . CKD (chronic kidney disease)   . Constipation   . Dialysis patient ([place])   . Diastolic dysfunction 5/63/1497.   Grade 2 per echo  . GERD (gastroesophageal reflux disease)   . Headache    Migraines- due to headache  . Hepatitis 2016   hepatitis c  . Hypertension   . 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 Migraines   . Psoriasis   . Shortness of breath dyspnea    when I had a lot of fluid on  . Substance abuse    methadone clinic  . Thrombocytopenia [facility])     Patient Active Problem List   Diagnosis Date Noted  . Complication of vascular access for dialysis 06/30/2016  . Numbness-Left wrist  07/03/2015  . ESRD (end stage renal disease) ([place])   . Renal failure 04/14/2015  . Chronic kidney disease, stage V ([place]) 04/14/2015  . Metabolic acidosis 02/63/7858  . Pulmonary vascular congestion 04/14/2015  . Bilateral leg edema 04/14/2015  . Malignant hypertension 04/14/2015  . 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 Diastolic dysfunction 85/11/7739  . Headache   . Hepatitis C 04/02/2015  . Acute renal failure ([place]) 04/01/2015  . Acute on chronic renal failure ([place]) 04/01/2015  . Hypertensive crisis 04/01/2015  . Elevated troponin 04/01/2015  . Dyspnea 04/01/2015  . Leg edema 04/01/2015  . Anemia 04/01/2015  . Thrombocytopenia ([place]) 04/01/2015  . Substance abuse 04/01/2015    Past Surgical History:  Procedure Laterality Date  . AV FISTULA PLACEMENT Left 06/18/2015   Procedure: LEFT RADIOCEPHALIC ARTERIOVENOUS (AV) FISTULA CREATION;  Surgeon: Elam Dutch, MD;  Location: [facility];  Service: Vascular;  Laterality: Left;  . AV FISTULA PLACEMENT Left 12/12/2015   Procedure: INSERTION OF ARTERIOVENOUS (AV) GORE-TEX GRAFT [place]  ( BRACH/AXILLARY GRAFT );  Surgeon: Katha Cabal, MD;  Location: [facility];  Service: Vascular;  Laterality: Left;  . [place] REMOVAL Left 06/30/2016   Procedure: REMOVAL OF ARTERIOVENOUS GORETEX GRAFT ([place]);  Surgeon: Katha Cabal, MD;  Location: [facility];  Service: Vascular;  Laterality: Left;  . BASCILIC  VEIN TRANSPOSITION Left 07/09/2015   Procedure: LEFT BRACHIOCEPHALIC ARTERIOVENOUS FISTULA;  Surgeon: Elam Dutch, MD;  Location: [facility];  Service: Vascular;  Laterality: Left;  . CAPD INSERTION N/A 12/12/2015   Procedure: Removal of peritoneal dialysis catheter;  Surgeon: Katha Cabal, MD;  Location: [facility];  Service: Vascular;  Laterality: N/A;  . HEMATOMA EVACUATION Left 05/14/2016   Procedure: EVACUATION HEMATOMA ( DRAINAGE );  Surgeon: Katha Cabal, MD;  Location: [facility];  Service: Vascular;  Laterality: Left;  .  INSERTION OF DIALYSIS CATHETER N/A 09/03/2015   Procedure: INSERTION OF PERITONEAL DIALYSIS CATHETER;  Surgeon: Katha Cabal, MD;  Location: [facility];  Service: Vascular;  Laterality: N/A;  . PERIPHERAL VASCULAR CATHETERIZATION Left 04/23/2016   Procedure: A/V Shuntogram/Fistulagram;  Surgeon: Katha Cabal, MD;  Location: [place] [facility];  Service: Cardiovascular;  Laterality: Left;  . PERIPHERAL VASCULAR CATHETERIZATION N/A 04/23/2016   Procedure: A/V Shunt Intervention;  Surgeon: Katha Cabal, MD;  Location: [place] [facility];  Service: Cardiovascular;  Laterality: N/A;  . PERIPHERAL VASCULAR CATHETERIZATION Right 06/08/2016   Procedure: A/V Shuntogram/Fistulagram;  Surgeon: Katha Cabal, MD;  Location: [place] [facility];  Service: Cardiovascular;  Laterality: Right;  . PERIPHERAL VASCULAR CATHETERIZATION N/A 06/08/2016   Procedure: A/V Shunt Intervention;  Surgeon: Katha Cabal, MD;  Location: [place] [facility];  Service: Cardiovascular;  Laterality: N/A;  . permacath Right   . REVISION OF ARTERIOVENOUS GORETEX GRAFT Left 05/14/2016   Procedure: REVISION OF ARTERIOVENOUS GORETEX GRAFT ( [place] );  Surgeon: Katha Cabal, MD;  Location: [facility];  Service: Vascular;  Laterality: Left;       Home Medications    Prior to Admission medications   Medication Sig Start Date End Date Taking? Authorizing Provider  amLODipine (NORVASC) 5 MG tablet Take 5 mg by mouth daily.   Yes Historical Provider, MD  furosemide (LASIX) 80 MG tablet Take 80 mg by mouth daily.    Yes Historical Provider, MD  hydrALAZINE (APRESOLINE) 50 MG tablet Take 1 tablet (50 mg total) by mouth every 8 (eight) hours. 04/17/15  Yes Lezlie Octave Black, NP  labetalol (NORMODYNE) 300 MG tablet Take 1 tablet (300 mg total) by mouth 2 (two) times daily. 04/17/15  Yes Lezlie Octave Black, NP  METHADONE HCL PO Take 100 mg by mouth daily.   Yes Historical Provider, MD  Multiple Vitamin (MULTIVITAMIN WITH  MINERALS) TABS tablet Take 1 tablet by mouth daily.   Yes Historical Provider, MD  sevelamer carbonate (RENVELA) 800 MG tablet Take 800-1,600 mg by mouth 3 (three) times daily with meals. Take 2 tablets with meals, and 1 tablet with snacks   Yes Historical Provider, MD  triamcinolone cream (KENALOG) 0.1 % Apply 1 application topically 3 (three) times a week.   Yes Historical Provider, MD  cyclobenzaprine (FLEXERIL) 5 MG tablet Take 1 tablet (5 mg total) by mouth 3 (three) times daily as needed for muscle spasms. 07/19/16   Elnora Morrison, MD  predniSONE (DELTASONE) 10 MG tablet 6, 5, 4, 3, 2 then 1 tablet by mouth daily for 6 days total. 07/13/16   Evalee [place], PA-C    Family History Family History  Problem Relation Age of Onset  . Diabetes Mother   . Heart disease Father   . Hypertension Father     Social History Social History  Substance Use Topics  . Smoking status: Current Every Day Smoker    Packs/day: 0.50  Years: 10.00    Types: Cigarettes  . Smokeless tobacco: Never Used     Comment: 10 years  off and on  . Alcohol use No     Allergies   Penicillins   Review of Systems Review of Systems  Constitutional: Negative for chills and fever.  Genitourinary: Negative for difficulty urinating and frequency.  Musculoskeletal: Positive for back pain and myalgias.  Neurological: Negative for numbness.  All other systems reviewed and are negative.  Physical Exam Updated Vital Signs BP (!) 163/108 (BP Location: Right Arm)   Pulse 84   Temp 98.1 F (36.7 C) (Oral)   Resp 18   Ht 6\' 2"  (1.88 m)   Wt 185 lb (83.9 kg)   SpO2 96%   BMI 23.75 kg/m   Physical Exam  Constitutional: He is oriented to person, place, and time. He appears well-developed and well-nourished.  HENT:  Head: Normocephalic and atraumatic.  Eyes: EOM are normal.  Neck: Normal range of motion.  Cardiovascular: Normal rate.   Pulmonary/Chest: Effort normal. No respiratory distress.  Abdominal: Soft.    Musculoskeletal: Normal range of motion.  Tenderness to the Lumbosacral and paraspinal regions. No midline spinal tenderness. Flexion and extension at the hips and knee 5/5. Grossly LEs intact with palpation.   Neurological: He is alert and oriented to person, place, and time.  Reflex Scores:      Patellar reflexes are 1+ on the right side and 1+ on the left side.      Achilles reflexes are 1+ on the right side and 1+ on the left side. Skin: Skin is warm and dry.  Psychiatric: He has a normal mood and affect.  Nursing note and vitals reviewed.  ED Treatments / Results  Labs (all labs ordered are listed, but only abnormal results are displayed) Labs Reviewed - No data to display  EKG  EKG Interpretation None       Radiology No results found.  Procedures Procedures  DIAGNOSTIC STUDIES: Oxygen Saturation is 100% on RA, normal by my interpretation.    COORDINATION OF CARE: 11:30 AM Discussed next steps with pt. Pt verbalized understanding and is agreeable with the plan.    Medications Ordered in ED Medications  dexamethasone (DECADRON) injection 10 mg (not administered)  cyclobenzaprine (FLEXERIL) tablet 5 mg (not administered)     Initial Impression / Assessment and Plan / ED Course  I have reviewed the triage vital signs and the nursing notes.  Pertinent labs & imaging results that were available during my care of the patient were reviewed by me and considered in my medical decision making (see chart for details).  Clinical Course   Acute on chronic back pain. Patient follows regularly for dialysis.  Normal neurologic exam. Discussed follow-up with orthopedics and primary doctor.  Results and differential diagnosis were discussed with the patient/parent/guardian. Xrays were independently reviewed by myself.  Close follow up outpatient was discussed, comfortable with the plan.   Medications  dexamethasone (DECADRON) injection 10 mg (not administered)   cyclobenzaprine (FLEXERIL) tablet 5 mg (not administered)    Vitals:   07/19/16 0957 07/19/16 1203  BP: (!) 176/114 (!) 163/108  Pulse: 86 84  Resp: 18   Temp: 98.1 F (36.7 C)   TempSrc: Oral   SpO2: 100% 96%  Weight: 185 lb (83.9 kg)   Height: 6\' 2"  (1.88 m)     Final diagnoses:  Chronic midline low back pain, with sciatica presence unspecified    Final Clinical Impressions(s) /  ED Diagnoses   Final diagnoses:  Chronic midline low back pain, with sciatica presence unspecified    New Prescriptions New Prescriptions   CYCLOBENZAPRINE (FLEXERIL) 5 MG TABLET    Take 1 tablet (5 mg total) by mouth 3 (three) times daily as needed for muscle spasms.     Elnora Morrison, MD 07/19/16 [phone]

## 2016-07-19 NOTE — Discharge Instructions (Signed)
Return for weakness, fevers or new concerns.  If you were given medicines take as directed.  If you are on coumadin or contraceptives realize their levels and effectiveness is altered by many different medicines.  If you have any reaction (rash, tongues swelling, other) to the medicines stop taking and see a physician.    If your blood pressure was elevated in the ER make sure you follow up for management with a primary doctor or return for chest pain, shortness of breath or stroke symptoms.  Please follow up as directed and return to the ER or see a physician for new or worsening symptoms.  Thank you. Vitals:   07/19/16 0957 07/19/16 1203  BP: (!) 176/114 (!) 163/108  Pulse: 86 84  Resp: 18   Temp: 98.1 F (36.7 C)   TempSrc: Oral   SpO2: 100% 96%  Weight: 185 lb (83.9 kg)   Height: 6\' 2"  (1.88 m)

## 2016-07-19 NOTE — ED Notes (Addendum)
Pt states he was seen here in the ED a few months ago, received prednisone, which seemed to help. Pt states he was seen a week ago for the same problem, he received a shot. The pain has worsened since. Pt states, "I used to be able to stand in a certain position and it would relieve the pain." pt states the pain is 10/10 in lower back and legs.

## 2016-07-25 ENCOUNTER — Inpatient Hospital Stay (HOSPITAL_COMMUNITY)
Admission: EM | Admit: 2016-07-25 | Discharge: 2016-07-27 | DRG: 193 | Disposition: A | Discharge status: Home / Self Care | Payer: Medicaid Other | Attending: Internal Medicine | Admitting: Internal Medicine

## 2016-07-25 ENCOUNTER — Encounter (HOSPITAL_COMMUNITY): Payer: Self-pay | Admitting: Emergency Medicine

## 2016-07-25 ENCOUNTER — Emergency Department (HOSPITAL_COMMUNITY): Payer: Medicaid Other

## 2016-07-25 DIAGNOSIS — Z833 Family history of diabetes mellitus: Secondary | ICD-10-CM

## 2016-07-25 DIAGNOSIS — K219 Gastro-esophageal reflux disease without esophagitis: Secondary | ICD-10-CM | POA: Diagnosis present

## 2016-07-25 DIAGNOSIS — F1910 Other psychoactive substance abuse, uncomplicated: Secondary | ICD-10-CM | POA: Diagnosis present

## 2016-07-25 DIAGNOSIS — G8929 Other chronic pain: Secondary | ICD-10-CM | POA: Diagnosis present

## 2016-07-25 DIAGNOSIS — Z79891 Long term (current) use of opiate analgesic: Secondary | ICD-10-CM

## 2016-07-25 DIAGNOSIS — F17200 Nicotine dependence, unspecified, uncomplicated: Secondary | ICD-10-CM | POA: Diagnosis present

## 2016-07-25 DIAGNOSIS — D649 Anemia, unspecified: Secondary | ICD-10-CM | POA: Diagnosis present

## 2016-07-25 DIAGNOSIS — F1120 Opioid dependence, uncomplicated: Secondary | ICD-10-CM | POA: Diagnosis present

## 2016-07-25 DIAGNOSIS — Z9115 Patient's noncompliance with renal dialysis: Secondary | ICD-10-CM | POA: Diagnosis not present

## 2016-07-25 DIAGNOSIS — Z992 Dependence on renal dialysis: Secondary | ICD-10-CM | POA: Diagnosis not present

## 2016-07-25 DIAGNOSIS — F17210 Nicotine dependence, cigarettes, uncomplicated: Secondary | ICD-10-CM | POA: Diagnosis present

## 2016-07-25 DIAGNOSIS — R000 Tachycardia, unspecified: Secondary | ICD-10-CM | POA: Diagnosis present

## 2016-07-25 DIAGNOSIS — Z9114 Patient's other noncompliance with medication regimen: Secondary | ICD-10-CM | POA: Diagnosis not present

## 2016-07-25 DIAGNOSIS — Z880 Allergy status to penicillin: Secondary | ICD-10-CM

## 2016-07-25 DIAGNOSIS — M1990 Unspecified osteoarthritis, unspecified site: Secondary | ICD-10-CM | POA: Diagnosis present

## 2016-07-25 DIAGNOSIS — N186 End stage renal disease: Secondary | ICD-10-CM | POA: Diagnosis present

## 2016-07-25 DIAGNOSIS — Z8249 Family history of ischemic heart disease and other diseases of the circulatory system: Secondary | ICD-10-CM

## 2016-07-25 DIAGNOSIS — J189 Pneumonia, unspecified organism: Secondary | ICD-10-CM

## 2016-07-25 DIAGNOSIS — Z9119 Patient's noncompliance with other medical treatment and regimen: Secondary | ICD-10-CM | POA: Diagnosis not present

## 2016-07-25 DIAGNOSIS — E8889 Other specified metabolic disorders: Secondary | ICD-10-CM | POA: Diagnosis present

## 2016-07-25 DIAGNOSIS — Z79899 Other long term (current) drug therapy: Secondary | ICD-10-CM

## 2016-07-25 DIAGNOSIS — R0682 Tachypnea, not elsewhere classified: Secondary | ICD-10-CM | POA: Diagnosis present

## 2016-07-25 DIAGNOSIS — J181 Lobar pneumonia, unspecified organism: Principal | ICD-10-CM | POA: Diagnosis present

## 2016-07-25 DIAGNOSIS — Y95 Nosocomial condition: Secondary | ICD-10-CM | POA: Diagnosis present

## 2016-07-25 DIAGNOSIS — Z9889 Other specified postprocedural states: Secondary | ICD-10-CM

## 2016-07-25 DIAGNOSIS — Z8701 Personal history of pneumonia (recurrent): Secondary | ICD-10-CM | POA: Diagnosis not present

## 2016-07-25 DIAGNOSIS — I169 Hypertensive crisis, unspecified: Secondary | ICD-10-CM

## 2016-07-25 DIAGNOSIS — I5189 Other ill-defined heart diseases: Secondary | ICD-10-CM | POA: Diagnosis present

## 2016-07-25 DIAGNOSIS — I120 Hypertensive chronic kidney disease with stage 5 chronic kidney disease or end stage renal disease: Secondary | ICD-10-CM | POA: Diagnosis present

## 2016-07-25 DIAGNOSIS — I10 Essential (primary) hypertension: Secondary | ICD-10-CM | POA: Diagnosis not present

## 2016-07-25 LAB — BASIC METABOLIC PANEL
Anion gap: 11 (ref 5–15)
BUN: 99 mg/dL — AB (ref 6–20)
CHLORIDE: 102 mmol/L (ref 101–111)
CO2: 21 mmol/L — ABNORMAL LOW (ref 22–32)
CREATININE: 8.49 mg/dL — AB (ref 0.61–1.24)
Calcium: 8.1 mg/dL — ABNORMAL LOW (ref 8.9–10.3)
GFR calc Af Amer: 8 mL/min — ABNORMAL LOW (ref >60)
GFR, EST NON AFRICAN AMERICAN: 7 mL/min — AB (ref >60)
GLUCOSE: 86 mg/dL (ref 65–99)
POTASSIUM: 3.7 mmol/L (ref 3.5–5.1)
SODIUM: 134 mmol/L — AB (ref 135–145)

## 2016-07-25 LAB — CBC
HEMATOCRIT: 28.9 % — AB (ref 39.0–52.0)
Hemoglobin: 9.4 g/dL — ABNORMAL LOW (ref 13.0–17.0)
MCH: 27.4 pg (ref 26.0–34.0)
MCHC: 32.5 g/dL (ref 30.0–36.0)
MCV: 84.3 fL (ref 78.0–100.0)
Platelets: 354 10*3/uL (ref 150–400)
RBC: 3.43 MIL/uL — ABNORMAL LOW (ref 4.22–5.81)
RDW: 14.9 % (ref 11.5–15.5)
WBC: 26.5 10*3/uL — ABNORMAL HIGH (ref 4.0–10.5)

## 2016-07-25 LAB — I-STAT TROPONIN, ED: Troponin i, poc: 0 ng/mL (ref 0.00–0.08)

## 2016-07-25 LAB — RAPID URINE DRUG SCREEN, HOSP PERFORMED
AMPHETAMINES: NOT DETECTED
BARBITURATES: NOT DETECTED
Benzodiazepines: NOT DETECTED
Cocaine: NOT DETECTED
Opiates: POSITIVE — AB
TETRAHYDROCANNABINOL: NOT DETECTED

## 2016-07-25 LAB — STREP PNEUMONIAE URINARY ANTIGEN: STREP PNEUMO URINARY ANTIGEN: NEGATIVE

## 2016-07-25 LAB — I-STAT CG4 LACTIC ACID, ED: Lactic Acid, Venous: 0.97 mmol/L (ref 0.5–1.9)

## 2016-07-25 MED ORDER — AZTREONAM 1 G IJ SOLR
500.0000 mg | Freq: Two times a day (BID) | INTRAMUSCULAR | Status: DC
  Administered 2016-07-25 – 2016-07-27 (×4): 500 mg via INTRAVENOUS
  Filled 2016-07-25 (×7): qty 0.5

## 2016-07-25 MED ORDER — THIAMINE HCL 100 MG/ML IJ SOLN: 100.0000 mg | Freq: Every day | INTRAMUSCULAR | Status: DC

## 2016-07-25 MED ORDER — HYDRALAZINE HCL 25 MG PO TABS
ORAL_TABLET | ORAL | Status: AC
  Filled 2016-07-25: qty 2

## 2016-07-25 MED ORDER — LORAZEPAM 1 MG PO TABS: 1.0000 mg | ORAL_TABLET | Freq: Four times a day (QID) | ORAL | Status: DC | PRN

## 2016-07-25 MED ORDER — AMLODIPINE BESYLATE 5 MG PO TABS
5.0000 mg | ORAL_TABLET | Freq: Every day | ORAL | Status: DC
  Administered 2016-07-25 – 2016-07-27 (×2): 5 mg via ORAL
  Filled 2016-07-25 (×3): qty 1

## 2016-07-25 MED ORDER — SEVELAMER CARBONATE 800 MG PO TABS
800.0000 mg | ORAL_TABLET | Freq: Three times a day (TID) | ORAL | Status: DC
  Administered 2016-07-26 (×2): 800 mg via ORAL
  Administered 2016-07-27 (×2): 1600 mg via ORAL
  Filled 2016-07-25: qty 2
  Filled 2016-07-25 (×2): qty 1
  Filled 2016-07-25: qty 2

## 2016-07-25 MED ORDER — NEPRO/CARBSTEADY PO LIQD
237.0000 mL | Freq: Two times a day (BID) | ORAL | Status: DC
  Administered 2016-07-26 – 2016-07-27 (×3): 237 mL via ORAL

## 2016-07-25 MED ORDER — HYDRALAZINE HCL 20 MG/ML IJ SOLN
10.0000 mg | Freq: Once | INTRAMUSCULAR | Status: AC
  Administered 2016-07-25: 10 mg via INTRAVENOUS
  Filled 2016-07-25: qty 1

## 2016-07-25 MED ORDER — MORPHINE SULFATE (PF) 2 MG/ML IV SOLN
2.0000 mg | INTRAVENOUS | Status: DC | PRN
Start: 2016-07-25 — End: 2016-07-25

## 2016-07-25 MED ORDER — ONDANSETRON HCL 4 MG/2ML IJ SOLN
4.0000 mg | Freq: Once | INTRAMUSCULAR | Status: AC
  Administered 2016-07-25: 4 mg via INTRAVENOUS
  Filled 2016-07-25: qty 2

## 2016-07-25 MED ORDER — DEXTROSE 5 % IV SOLN
INTRAVENOUS | Status: AC
  Filled 2016-07-25: qty 1

## 2016-07-25 MED ORDER — OXYCODONE-ACETAMINOPHEN 5-325 MG PO TABS
1.0000 | ORAL_TABLET | Freq: Four times a day (QID) | ORAL | Status: DC | PRN
  Administered 2016-07-26 – 2016-07-27 (×2): 2 via ORAL
  Filled 2016-07-25 (×2): qty 2

## 2016-07-25 MED ORDER — METHADONE HCL 40 MG PO TBSO
100.0000 mg | ORAL_TABLET | Freq: Every day | ORAL | Status: DC
Start: 2016-07-25 — End: 2016-07-25

## 2016-07-25 MED ORDER — ADULT MULTIVITAMIN W/MINERALS CH
1.0000 | ORAL_TABLET | Freq: Every day | ORAL | Status: DC
  Administered 2016-07-26 – 2016-07-27 (×2): 1 via ORAL
  Filled 2016-07-25 (×2): qty 1

## 2016-07-25 MED ORDER — FOLIC ACID 1 MG PO TABS
1.0000 mg | ORAL_TABLET | Freq: Every day | ORAL | Status: DC
  Administered 2016-07-26 – 2016-07-27 (×2): 1 mg via ORAL
  Filled 2016-07-25 (×2): qty 1

## 2016-07-25 MED ORDER — VANCOMYCIN HCL 10 G IV SOLR
1500.0000 mg | Freq: Once | INTRAVENOUS | Status: AC
  Administered 2016-07-25: 1500 mg via INTRAVENOUS
  Filled 2016-07-25: qty 1500

## 2016-07-25 MED ORDER — MORPHINE SULFATE (PF) 4 MG/ML IV SOLN
4.0000 mg | INTRAVENOUS | Status: DC | PRN
  Administered 2016-07-25: 4 mg via INTRAVENOUS
  Filled 2016-07-25: qty 1

## 2016-07-25 MED ORDER — MORPHINE SULFATE (PF) 4 MG/ML IV SOLN
4.0000 mg | INTRAVENOUS | Status: DC | PRN
Start: 2016-07-25 — End: 2016-07-25
  Administered 2016-07-25: 4 mg via INTRAVENOUS
  Filled 2016-07-25: qty 1

## 2016-07-25 MED ORDER — VITAMIN B-1 100 MG PO TABS
100.0000 mg | ORAL_TABLET | Freq: Every day | ORAL | Status: DC
  Administered 2016-07-26 – 2016-07-27 (×2): 100 mg via ORAL
  Filled 2016-07-25 (×2): qty 1

## 2016-07-25 MED ORDER — LABETALOL HCL 200 MG PO TABS
300.0000 mg | ORAL_TABLET | Freq: Two times a day (BID) | ORAL | Status: DC
  Administered 2016-07-25 – 2016-07-27 (×3): 300 mg via ORAL
  Filled 2016-07-25 (×4): qty 2

## 2016-07-25 MED ORDER — METHADONE HCL 10 MG PO TABS
100.0000 mg | ORAL_TABLET | Freq: Every day | ORAL | Status: DC
  Administered 2016-07-25 – 2016-07-26 (×2): 100 mg via ORAL
  Filled 2016-07-25 (×2): qty 10

## 2016-07-25 MED ORDER — LORAZEPAM 2 MG/ML IJ SOLN: 1.0000 mg | Freq: Four times a day (QID) | INTRAMUSCULAR | Status: DC | PRN

## 2016-07-25 MED ORDER — NICOTINE 14 MG/24HR TD PT24
14.0000 mg | MEDICATED_PATCH | Freq: Every day | TRANSDERMAL | Status: DC
  Administered 2016-07-25 – 2016-07-27 (×3): 14 mg via TRANSDERMAL
  Filled 2016-07-25 (×3): qty 1

## 2016-07-25 MED ORDER — LACTATED RINGERS IV SOLN
INTRAVENOUS | Status: DC
  Administered 2016-07-25 – 2016-07-26 (×3): via INTRAVENOUS

## 2016-07-25 MED ORDER — DEXTROSE 5 % IV SOLN: 2.0000 g | Freq: Three times a day (TID) | INTRAVENOUS | Status: DC

## 2016-07-25 MED ORDER — HYDRALAZINE HCL 25 MG PO TABS
50.0000 mg | ORAL_TABLET | Freq: Three times a day (TID) | ORAL | Status: DC
  Administered 2016-07-25 – 2016-07-27 (×4): 50 mg via ORAL
  Filled 2016-07-25: qty 1
  Filled 2016-07-25 (×2): qty 2
  Filled 2016-07-25: qty 1
  Filled 2016-07-25: qty 2
  Filled 2016-07-25: qty 1

## 2016-07-25 MED ORDER — ACETAMINOPHEN 500 MG PO TABS
1000.0000 mg | ORAL_TABLET | Freq: Once | ORAL | Status: AC
  Administered 2016-07-25: 1000 mg via ORAL
  Filled 2016-07-25: qty 2

## 2016-07-25 MED ORDER — ENOXAPARIN SODIUM 30 MG/0.3ML ~~LOC~~ SOLN
30.0000 mg | SUBCUTANEOUS | Status: DC
  Administered 2016-07-25 – 2016-07-26 (×2): 30 mg via SUBCUTANEOUS
  Filled 2016-07-25 (×2): qty 0.3

## 2016-07-25 MED ORDER — DEXTROSE 5 % IV SOLN
2.0000 g | Freq: Once | INTRAVENOUS | Status: AC
  Administered 2016-07-25: 2 g via INTRAVENOUS
  Filled 2016-07-25: qty 2

## 2016-07-25 MED ORDER — CYCLOBENZAPRINE HCL 10 MG PO TABS: 5.0000 mg | ORAL_TABLET | Freq: Three times a day (TID) | ORAL | Status: DC | PRN

## 2016-07-25 MED ORDER — HYDRALAZINE HCL 20 MG/ML IJ SOLN: 10.0000 mg | INTRAMUSCULAR | Status: DC | PRN

## 2016-07-25 MED ORDER — LABETALOL HCL 5 MG/ML IV SOLN: 5.0000 mg | INTRAVENOUS | Status: DC | PRN

## 2016-07-25 MED ORDER — LABETALOL HCL 5 MG/ML IV SOLN
20.0000 mg | Freq: Once | INTRAVENOUS | Status: AC
  Administered 2016-07-25: 20 mg via INTRAVENOUS
  Filled 2016-07-25: qty 4
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
## 2016-07-25 NOTE — ED Notes (Signed)
AC paged about Vancocin.

## 2016-07-25 NOTE — Progress Notes (Addendum)
Pharmacy Antibiotic Note  Terrance Reynolds is a 35 y.o. male admitted on 07/25/2016 with Pneumonia.  Pharmacy has been consulted for Vancomycin dosing and renal dose adjustment Dialysis patient  Plan: Vancomycin 1500 mg IV x 1 dose. Vancomycin 1 GM IV after each HD. F/U Dialysis schedule for next dose Change Azactam to 500 mg IV every 12 hours  Height: 6\' 2"  (188 cm) Weight: 185 lb (83.9 kg) IBW/kg (Calculated) : 82.2  Temp (24hrs), Avg:103.1 F (39.5 C), Min:103.1 F (39.5 C), Max:103.1 F (39.5 C)   Recent Labs Lab 07/25/16 1455 07/25/16 1509  WBC  --  26.5*  CREATININE  --  8.49*  LATICACIDVEN 0.97  --     Estimated Creatinine Clearance: 14 mL/min (by C-G formula based on SCr of 8.49 mg/dL (H)).    Allergies  Allergen Reactions  . Penicillins Anaphylaxis    Child hood allergy, swelling of throat as a child Has patient had a PCN reaction causing immediate rash, facial/tongue/throat swelling, SOB or lightheadedness with hypotension: unknown Has patient had a PCN reaction causing severe rash involving mucus membranes or skin necrosis: unknown Has patient had a PCN reaction that required hospitalization unknown Has patient had a PCN reaction occurring within the last 10 years:  unknown    Thank you for allowing pharmacy to be a part of this patient's care.  Chriss Czar 07/25/2016 4:05 PM

## 2016-07-25 NOTE — ED Notes (Signed)
Pt placed on O2 at 2L for comfort per admitting MD request.
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
## 2016-07-26 DIAGNOSIS — J189 Pneumonia, unspecified organism: Secondary | ICD-10-CM

## 2016-07-26 DIAGNOSIS — F1910 Other psychoactive substance abuse, uncomplicated: Secondary | ICD-10-CM

## 2016-07-26 DIAGNOSIS — D649 Anemia, unspecified: Secondary | ICD-10-CM

## 2016-07-26 DIAGNOSIS — I10 Essential (primary) hypertension: Secondary | ICD-10-CM

## 2016-07-26 DIAGNOSIS — F1120 Opioid dependence, uncomplicated: Secondary | ICD-10-CM

## 2016-07-26 DIAGNOSIS — N186 End stage renal disease: Secondary | ICD-10-CM

## 2016-07-26 LAB — BASIC METABOLIC PANEL
ANION GAP: 11 (ref 5–15)
BUN: 112 mg/dL — ABNORMAL HIGH (ref 6–20)
CALCIUM: 7 mg/dL — AB (ref 8.9–10.3)
CO2: 17 mmol/L — AB (ref 22–32)
Chloride: 104 mmol/L (ref 101–111)
Creatinine, Ser: 9.01 mg/dL — ABNORMAL HIGH (ref 0.61–1.24)
GFR, EST AFRICAN AMERICAN: 8 mL/min — AB (ref >60)
GFR, EST NON AFRICAN AMERICAN: 7 mL/min — AB (ref >60)
Glucose, Bld: 63 mg/dL — ABNORMAL LOW (ref 65–99)
POTASSIUM: 5.2 mmol/L — AB (ref 3.5–5.1)
Sodium: 132 mmol/L — ABNORMAL LOW (ref 135–145)

## 2016-07-26 LAB — CBC WITH DIFFERENTIAL/PLATELET
BASOS ABS: 0 10*3/uL (ref 0.0–0.1)
BASOS PCT: 0 %
EOS ABS: 0 10*3/uL (ref 0.0–0.7)
Eosinophils Relative: 0 %
HEMATOCRIT: 25.7 % — AB (ref 39.0–52.0)
HEMOGLOBIN: 8.4 g/dL — AB (ref 13.0–17.0)
LYMPHS PCT: 6 %
Lymphs Abs: 1.7 10*3/uL (ref 0.7–4.0)
MCH: 27.7 pg (ref 26.0–34.0)
MCHC: 32.7 g/dL (ref 30.0–36.0)
MCV: 84.8 fL (ref 78.0–100.0)
MONOS PCT: 6 %
Monocytes Absolute: 1.7 10*3/uL — ABNORMAL HIGH (ref 0.1–1.0)
NEUTROS PCT: 88 %
Neutro Abs: 24.7 10*3/uL — ABNORMAL HIGH (ref 1.7–7.7)
Platelets: 277 10*3/uL (ref 150–400)
RBC: 3.03 MIL/uL — ABNORMAL LOW (ref 4.22–5.81)
RDW: 15.2 % (ref 11.5–15.5)
WBC: 28.1 10*3/uL — ABNORMAL HIGH (ref 4.0–10.5)

## 2016-07-26 MED ORDER — SODIUM CHLORIDE 0.9 % IV SOLN: 100.0000 mL | INTRAVENOUS | Status: DC | PRN

## 2016-07-26 MED ORDER — ALTEPLASE 2 MG IJ SOLR: 2.0000 mg | Freq: Once | INTRAMUSCULAR | Status: DC | PRN

## 2016-07-26 MED ORDER — IPRATROPIUM-ALBUTEROL 0.5-2.5 (3) MG/3ML IN SOLN
3.0000 mL | Freq: Four times a day (QID) | RESPIRATORY_TRACT | Status: DC
  Administered 2016-07-26: 3 mL via RESPIRATORY_TRACT
  Filled 2016-07-26 (×2): qty 3

## 2016-07-26 MED ORDER — PENTAFLUOROPROP-TETRAFLUOROETH EX AERO: 1.0000 "application " | INHALATION_SPRAY | CUTANEOUS | Status: DC | PRN

## 2016-07-26 MED ORDER — HEPARIN SODIUM (PORCINE) 1000 UNIT/ML DIALYSIS: 1000.0000 [IU] | INTRAMUSCULAR | Status: DC | PRN

## 2016-07-26 MED ORDER — HYDRALAZINE HCL 25 MG PO TABS
ORAL_TABLET | ORAL | Status: AC
  Filled 2016-07-26: qty 1

## 2016-07-26 MED ORDER — VANCOMYCIN HCL IN DEXTROSE 1-5 GM/200ML-% IV SOLN
1000.0000 mg | INTRAVENOUS | Status: DC
  Administered 2016-07-26: 1000 mg via INTRAVENOUS
  Filled 2016-07-26 (×3): qty 200

## 2016-07-26 MED ORDER — LIDOCAINE-PRILOCAINE 2.5-2.5 % EX CREA: 1.0000 "application " | TOPICAL_CREAM | CUTANEOUS | Status: DC | PRN

## 2016-07-26 MED ORDER — GUAIFENESIN ER 600 MG PO TB12
1200.0000 mg | ORAL_TABLET | Freq: Two times a day (BID) | ORAL | Status: DC
  Administered 2016-07-26 – 2016-07-27 (×2): 1200 mg via ORAL
  Filled 2016-07-26 (×2): qty 2

## 2016-07-26 MED ORDER — LIDOCAINE HCL (PF) 1 % IJ SOLN: 5.0000 mL | INTRAMUSCULAR | Status: DC | PRN

## 2016-07-26 NOTE — Progress Notes (Signed)
K. Schorr notified that Methadone already given at 2245 and the order to not give was done at 2300.  Also discussed the fact that the pt is a former IV drug abuser and he has morphine IV ordered.  Was told she was going to discontinue it and order percocet.

## 2016-07-26 NOTE — Progress Notes (Signed)
PROGRESS NOTE    Terrance Reynolds  ONG:[id number] DOB: 1981/03/18 DOA: 07/25/2016 PCP: [facility]    Brief Narrative:  This is a 61 history of end-stage renal disease, grade 2 diastolic dysfunction, hepatitis C and hypertension. His history of substance abuse and is on chronic methadone maintenance therapy. Presented to the hospital with fever, chest discomfort and coughing. Found to have left lobar pneumonia. There is a the hospital for further evaluation.   Assessment & Plan:   Principal Problem:   HCAP (healthcare-associated pneumonia) Active Problems:   Anemia   Substance abuse   Malignant hypertension   Methadone maintenance therapy patient ([place])   Diastolic dysfunction   ESRD (end stage renal disease) (HCC)   Tobacco use disorder   1. HCAP. Started on vancomycin and Azactam. Blood cultures on process. Fevers appears to have improved. Clinically the patient is feeling better. Continue intravenous antibiotics for now.  2. Leukocytosis. Possibly related to infectious process, although the patient does report being prescribed prednisone for his back pain which she was taking up until admission.  3. End-stage renal disease. Nephrology following. Patient has been noncompliant with dialysis. He is undergoing dialysis today.  4. Hypertension. Anticipate improvement after dialysis. Continue home medications.  5. Anemia. Likely related to chronic disease. At baseline.  6. Substance abuse. Continue to follow with the methadone clinic after discharge.   DVT prophylaxis: lovenox Code Status: full  Family Communication: no family at bedside Disposition Plan: discharge home once improved   Consultants:   Nephrology  Procedures:     Antimicrobials:   Vancomycin 10/8>>  Aztreonam 10/8>>   Subjective: Breathing is improving. Still has productive cough  Objective: Vitals:   07/26/16 1700 07/26/16 1730 07/26/16 1800 07/26/16 1830  BP:  (!) 184/96 (!) 184/100 (!) 197/103 (!) 177/68  Pulse: 83 81 79 81  Resp: 16 16 16 16   Temp:      TempSrc:      SpO2:      Weight:      Height:        Intake/Output Summary (Last 24 hours) at 07/26/16 1901 Last data filed at 07/26/16 1300  Gross per 24 hour  Intake          1306.67 ml  Output                0 ml  Net          1306.67 ml   Filed Weights   07/25/16 2022 07/26/16 0624 07/26/16 1628  Weight: 82.1 kg (181 lb) 83.2 kg (183 lb 6.8 oz) 84 kg (185 lb 3 oz)    Examination:  General exam: Appears calm and comfortable  Respiratory system: Coarse breath sounds at left base. Respiratory effort normal. Cardiovascular system: S1 & S2 heard, RRR. No JVD, murmurs, rubs, gallops or clicks. Trace pedal edema. Gastrointestinal system: Abdomen is nondistended, soft and nontender. No organomegaly or masses felt. Normal bowel sounds heard. Central nervous system: Alert and oriented. No focal neurological deficits. Extremities: Symmetric 5 x 5 power. Skin: No rashes, lesions or ulcers Psychiatry: Judgement and insight appear normal. Mood & affect appropriate.     Data Reviewed: I have personally reviewed following labs and imaging studies  CBC:  Recent Labs Lab 07/25/16 1509 07/26/16 0614  WBC 26.5* 28.1*  NEUTROABS  --  24.7*  HGB 9.4* 8.4*  HCT 28.9* 25.7*  MCV 84.3 84.8  PLT 354 440   Basic Metabolic Panel:  Recent Labs  Lab 07/25/16 1509 07/26/16 0614  NA 134* 132*  K 3.7 5.2*  CL 102 104  CO2 21* 17*  GLUCOSE 86 63*  BUN 99* 112*  CREATININE 8.49* 9.01*  CALCIUM 8.1* 7.0*   GFR: Estimated Creatinine Clearance: 13.2 mL/min (by C-G formula based on SCr of 9.01 mg/dL (H)). Liver Function Tests: No results for input(s): AST, ALT, ALKPHOS, BILITOT, PROT, ALBUMIN in the last 168 hours. No results for input(s): LIPASE, AMYLASE in the last 168 hours. No results for input(s): AMMONIA in the last 168 hours. Coagulation Profile: No results for input(s): INR,  PROTIME in the last 168 hours. Cardiac Enzymes: No results for input(s): CKTOTAL, CKMB, CKMBINDEX, TROPONINI in the last 168 hours. BNP (last 3 results) No results for input(s): PROBNP in the last 8760 hours. HbA1C: No results for input(s): HGBA1C in the last 72 hours. CBG: No results for input(s): GLUCAP in the last 168 hours. Lipid Profile: No results for input(s): CHOL, HDL, LDLCALC, TRIG, CHOLHDL, LDLDIRECT in the last 72 hours. Thyroid Function Tests: No results for input(s): TSH, T4TOTAL, FREET4, T3FREE, THYROIDAB in the last 72 hours. Anemia Panel: No results for input(s): VITAMINB12, FOLATE, FERRITIN, TIBC, IRON, RETICCTPCT in the last 72 hours. Sepsis Labs:  Recent Labs Lab 07/25/16 1455  LATICACIDVEN 0.97    Recent Results (from the past 240 hour(s))  Culture, blood (routine x 2) Call MD if unable to obtain prior to antibiotics being given     Status: None (Preliminary result)   Collection Time: 07/25/16  2:47 PM  Result Value Ref Range Status   Specimen Description RIGHT ANTECUBITAL  Final   Special Requests BOTTLES DRAWN AEROBIC AND ANAEROBIC [place]  Final   Culture NO GROWTH < 12 HOURS  Final   Report Status PENDING  Incomplete  Culture, blood (routine x 2) Call MD if unable to obtain prior to antibiotics being given     Status: None (Preliminary result)   Collection Time: 07/25/16 11:13 PM  Result Value Ref Range Status   Specimen Description LEFT ANTECUBITAL  Final   Special Requests BOTTLES DRAWN AEROBIC AND ANAEROBIC 6CC EACH  Final   Culture NO GROWTH < 12 HOURS  Final   Report Status PENDING  Incomplete         Radiology Studies: Dg Chest 2 View  Result Date: 07/25/2016 CLINICAL DATA:  Shortness of breath, high fever. EXAM: CHEST  2 VIEW COMPARISON:  Chest radiograph 01/14/2016 FINDINGS: Dual-lumen central venous catheter from right internal jugular approach terminates at the cavoatrial junction and right atrium. Cardiomediastinal silhouette is  normal. Mediastinal contours appear intact. There is no evidence of pleural effusion or pneumothorax. There is dense opacification of the left lower lobe. Osseous structures are without acute abnormality. Soft tissues are grossly normal. IMPRESSION: Dense airspace consolidation of the left lower lobe consistent with lobar pneumonia. Electronically Signed   By: Fidela Salisbury M.D.   On: 07/25/2016 15:24        Scheduled Meds: . amLODipine  5 mg Oral Daily  . aztreonam  500 mg Intravenous Q12H  . enoxaparin (LOVENOX) injection  30 mg Subcutaneous Q24H  . feeding supplement (NEPRO CARB STEADY)  237 mL Oral BID BM  . folic acid  1 mg Oral Daily  . hydrALAZINE  50 mg Oral Q8H  . labetalol  300 mg Oral BID  . methadone  100 mg Oral QHS  . multivitamin with minerals  1 tablet Oral Daily  . nicotine  14 mg Transdermal  Daily  . sevelamer carbonate  800-1,600 mg Oral TID WC  . thiamine  100 mg Oral Daily   Or  . thiamine  100 mg Intravenous Daily  . vancomycin  1,000 mg Intravenous Q M,W,F-HD   Continuous Infusions: . lactated ringers 100 mL/hr at 07/26/16 0627     LOS: 1 day    Time spent: 16mins    MEMON,JEHANZEB, MD [facility] Pager [phone]  If 7PM-7AM, please contact night-coverage [email or web address] Password [email or web address] 07/26/2016, 7:01 PM

## 2016-07-26 NOTE — Progress Notes (Signed)
Pharmacy Antibiotic Note  Terrance Reynolds is a 35 y.o. male admitted on 07/25/2016 with Pneumonia.  Pharmacy has been consulted for Vancomycin dosing and renal dose adjustment.  Dialysis patient, HD planned for today  Plan: Vancomycin 1000 mg IV after each dialysis. Azactam to 500 mg IV every 12 hours  Height: 6\' 2"  (188 cm) Weight: 183 lb 6.8 oz (83.2 kg) IBW/kg (Calculated) : 82.2  Temp (24hrs), Avg:99.4 F (37.4 C), Min:97.6 F (36.4 C), Max:103.1 F (39.5 C)   Recent Labs Lab 07/25/16 1455 07/25/16 1509 07/26/16 0614  WBC  --  26.5* 28.1*  CREATININE  --  8.49* 9.01*  LATICACIDVEN 0.97  --   --     Estimated Creatinine Clearance: 13.2 mL/min (by C-G formula based on SCr of 9.01 mg/dL (H)).    Allergies  Allergen Reactions  . Penicillins Anaphylaxis    Child hood allergy, swelling of throat as a child Has patient had a PCN reaction causing immediate rash, facial/tongue/throat swelling, SOB or lightheadedness with hypotension: unknown Has patient had a PCN reaction causing severe rash involving mucus membranes or skin necrosis: unknown Has patient had a PCN reaction that required hospitalization unknown Has patient had a PCN reaction occurring within the last 10 years:  unknown   Thank you for allowing pharmacy to be a part of this patient's care.  Hart Robinsons A 07/26/2016 1:12 PM
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
## 2016-07-26 NOTE — Progress Notes (Signed)
Nutrition Follow-up   INTERVENTION:  Renal diet with 1.2 liter fluid restriction  High Protein snack q hs daily   Nepro Shake po daily, each supplement provides 425 kcal and 19 grams protein    NUTRITION DIAGNOSIS:   Increased protein needs related to ESRD with HD AEB est needs   GOAL:  Pt to meet >/= 90% of their estimated nutrition needs      MONITOR:  Po intake, labs and wt trends      ASSESSMENT:  RD drawn to pt due to malnutrition screen. He is eating well and his weight is stable based on patient's report and weight hx (81-83 kg) range. He has been on dialysis around 1 year now and says at first his appetite was not good but is much better now. He ate 100% of his lunch. Patient says he trys to follow a low sodium diet and "watch" his phosphorus foods. He avoids dark sodas. He presents with pneumonia. He is scheduled for HD today. Non-compliant with his tx ([place]).    Nutrition-Focused physical exam completed. Findings are no fat or muscle depletion, and no edema.    Recent Labs Lab 07/25/16 1509 07/26/16 0614  NA 134* 132*  K 3.7 5.2*  CL 102 104  CO2 21* 17*  BUN 99* 112*  CREATININE 8.49* 9.01*  CALCIUM 8.1* 7.0*  GLUCOSE 86 63*    Labs: sodium 132, potassium 5.2, BUN 112, Cr 9.01   Meds/vitamin supplments: MVI, Folic acid, thiamine  Diet Order:  Diet renal with fluid restriction Room service appropriate? Yes; Fluid consistency: Thin  Skin:   intact  with some scratches on face.  Last BM:   10/8  Height:   Ht Readings from Last 1 Encounters:  07/25/16 6\' 2"  (1.88 m)    Weight:   Wt Readings from Last 1 Encounters:  07/26/16 183 lb 6.8 oz (83.2 kg)  Dry wt (82.5 kg) stable  Ideal Body Weight:   86 kg (SBW-88 kg)  BMI:  Body mass index is 23.55 kg/m.  Estimated Nutritional Needs:   Kcal:   2400-2600  Protein:   105-114 gr  Fluid:   1.2 liters daily  EDUCATION NEEDS: Patient declined  diet education    Colman Cater  MS,RD,CSG,LDN Office: [phone] Pager: [phone]

## 2016-07-27 LAB — CBC
HCT: 22.6 % — ABNORMAL LOW (ref 39.0–52.0)
Hemoglobin: 7.4 g/dL — ABNORMAL LOW (ref 13.0–17.0)
MCH: 27.5 pg (ref 26.0–34.0)
MCHC: 32.7 g/dL (ref 30.0–36.0)
MCV: 84 fL (ref 78.0–100.0)
Platelets: 234 10*3/uL (ref 150–400)
RBC: 2.69 MIL/uL — ABNORMAL LOW (ref 4.22–5.81)
RDW: 15.2 % (ref 11.5–15.5)
WBC: 16.2 10*3/uL — ABNORMAL HIGH (ref 4.0–10.5)

## 2016-07-27 LAB — RENAL FUNCTION PANEL
ALBUMIN: 2.4 g/dL — AB (ref 3.5–5.0)
Anion gap: 9 (ref 5–15)
BUN: 45 mg/dL — ABNORMAL HIGH (ref 6–20)
CALCIUM: 7.6 mg/dL — AB (ref 8.9–10.3)
CO2: 27 mmol/L (ref 22–32)
Chloride: 99 mmol/L — ABNORMAL LOW (ref 101–111)
Creatinine, Ser: 5.01 mg/dL — ABNORMAL HIGH (ref 0.61–1.24)
GFR, EST AFRICAN AMERICAN: 16 mL/min — AB (ref >60)
GFR, EST NON AFRICAN AMERICAN: 14 mL/min — AB (ref >60)
GLUCOSE: 113 mg/dL — AB (ref 65–99)
PHOSPHORUS: 3.7 mg/dL (ref 2.5–4.6)
POTASSIUM: 3.2 mmol/L — AB (ref 3.5–5.1)
SODIUM: 135 mmol/L (ref 135–145)

## 2016-07-27 LAB — HIV ANTIBODY (ROUTINE TESTING W REFLEX): HIV Screen 4th Generation wRfx: NONREACTIVE

## 2016-07-27 MED ORDER — METHADONE HCL 10 MG PO TABS
100.0000 mg | ORAL_TABLET | Freq: Once | ORAL | Status: DC
  Filled 2016-07-27: qty 10

## 2016-07-27 MED ORDER — EPOETIN ALFA 4000 UNIT/ML IJ SOLN
4000.0000 [IU] | INTRAMUSCULAR | Status: DC
  Filled 2016-07-27: qty 1

## 2016-07-27 MED ORDER — METHADONE HCL 10 MG PO TABS
100.0000 mg | ORAL_TABLET | Freq: Once | ORAL | Status: AC
  Administered 2016-07-27: 100 mg via ORAL

## 2016-07-27 MED ORDER — EPOETIN ALFA 20000 UNIT/ML IJ SOLN: 14000.0000 [IU] | INTRAMUSCULAR | Status: DC

## 2016-07-27 MED ORDER — METHADONE HCL 10 MG PO TABS: 100.0000 mg | ORAL_TABLET | Freq: Every day | ORAL | Status: DC

## 2016-07-27 MED ORDER — METHADONE HCL 10 MG PO TABS: 100.0000 mg | ORAL_TABLET | Freq: Every day | ORAL | 0 refills | Status: AC

## 2016-07-27 MED ORDER — GUAIFENESIN ER 600 MG PO TB12: 600.0000 mg | ORAL_TABLET | Freq: Two times a day (BID) | ORAL | 0 refills | Status: AC

## 2016-07-27 MED ORDER — LEVOFLOXACIN 500 MG PO TABS: 500.0000 mg | ORAL_TABLET | ORAL | 0 refills | Status: AC

## 2016-07-27 MED ORDER — IPRATROPIUM-ALBUTEROL 0.5-2.5 (3) MG/3ML IN SOLN: 3.0000 mL | Freq: Four times a day (QID) | RESPIRATORY_TRACT | Status: DC | PRN

## 2016-07-27 MED ORDER — EPOETIN ALFA 10000 UNIT/ML IJ SOLN
10000.0000 [IU] | INTRAMUSCULAR | Status: DC
Start: 2016-07-28 — End: 2016-07-27
  Filled 2016-07-27: qty 1

## 2016-07-27 NOTE — Progress Notes (Signed)
Patient discharged home with IV removed and sites intact. Patient discharged with personal belongings and prescriptions.  Gave patient a one time dose of methadone 100 mg PO per Dr. Roderic Palau.

## 2016-07-27 NOTE — Progress Notes (Signed)
Subjective: Interval History: has no complaint of nausea or vomiting. Presently patient says that his cough has improved. He has intermittent cough especially this morning. He denies any fever or chills or sweating. He denies any difficulty breathing and overall feels much better..  Objective: Vital signs in last 24 hours: Temp:  [97.9 F (36.6 C)-98.5 F (36.9 C)] 98.5 F (36.9 C) (10/10 0546) Pulse Rate:  [68-88] 68 (10/10 0754) Resp:  [14-20] 14 (10/10 0754) BP: (162-199)/(68-103) 168/96 (10/10 0546) SpO2:  [94 %-98 %] 94 % (10/10 0754) Weight:  [84 kg (185 lb 3 oz)-84.8 kg (186 lb 15.2 oz)] 84.8 kg (186 lb 15.2 oz) (10/10 0546) Weight change: 0.084 kg (3 oz)  Intake/Output from previous day: 10/09 0701 - 10/10 0700 In: 3180 [P.O.:480; I.V.:2400; IV Piggyback:300] Out: 0  Intake/Output this shift: No intake/output data recorded.  General appearance: alert, cooperative and no distress Resp: clear to auscultation bilaterally Cardio: regular rate and rhythm, S1, S2 normal, no murmur, click, rub or gallop GI: soft, non-tender; bowel sounds normal; no masses,  no organomegaly Extremities: No edema  Lab Results:  Recent Labs  07/26/16 0614 07/27/16 0550  WBC 28.1* 16.2*  HGB 8.4* 7.4*  HCT 25.7* 22.6*  PLT 277 234   BMET:  Recent Labs  07/26/16 0614 07/27/16 0550  NA 132* 135  K 5.2* 3.2*  CL 104 99*  CO2 17* 27  GLUCOSE 63* 113*  BUN 112* 45*  CREATININE 9.01* 5.01*  CALCIUM 7.0* 7.6*   No results for input(s): PTH in the last 72 hours. Iron Studies: No results for input(s): IRON, TIBC, TRANSFERRIN, FERRITIN in the last 72 hours.  Studies/Results: Dg Chest 2 View  Result Date: 07/25/2016 CLINICAL DATA:  Shortness of breath, high fever. EXAM: CHEST  2 VIEW COMPARISON:  Chest radiograph 01/14/2016 FINDINGS: Dual-lumen central venous catheter from right internal jugular approach terminates at the cavoatrial junction and right atrium. Cardiomediastinal silhouette  is normal. Mediastinal contours appear intact. There is no evidence of pleural effusion or pneumothorax. There is dense opacification of the left lower lobe. Osseous structures are without acute abnormality. Soft tissues are grossly normal. IMPRESSION: Dense airspace consolidation of the left lower lobe consistent with lobar pneumonia. Electronically Signed   By: Fidela Salisbury M.D.   On: 07/25/2016 15:24    I have reviewed the patient's current medications.  Assessment/Plan: Problem #1 pneumonia: Patient presently on antibiotics. His a febrile and his white blood cell count is improving. Problem #2 end-stage renal disease: His status post hemodialysis yesterday. Presently he denies any nausea or vomiting. Problem #3 anemia: His hemoglobin is low and declining. Possibly this is due to infrequent administration of Epogen since patient doesn't come to dialysis unit on a regular basis. Problem #4 hypertension: His blood. Reasonably controlled Problem #5 fluid management: Presently he doesn't have any sign of fluid overload.  Problem #6. Metabolic bone disease: His calcium and phosphorus is range Plan: We'll make arrangements for patient to get dialysis tomorrow We'll continue with Epogen after dialysis Patient encouraged to be compliant with his dialysis treatment.   LOS: 2 days   Cai Anfinson S 07/27/2016,9:18 AM

## 2016-07-27 NOTE — Discharge Summary (Signed)
Physician Discharge Summary  Terrance Reynolds WER:[id number] DOB: Feb 17, 1981 DOA: 07/25/2016  PCP: [facility] date: 07/25/2016 Discharge date: 07/27/2016  Admitted From: home Disposition:  home  Recommendations for Outpatient Follow-up:  1. Follow up with PCP in 1-2 weeks 2. Please obtain BMP/CBC in one week 3. Follow up at dialysis center tomorrow as previously scheduled  Home Health: Equipment/Devices:  Discharge Condition: stable CODE STATUS: full Diet recommendation: Heart Healthy   Brief/Interim Summary: This is a 38 history of end-stage renal disease, grade 2 diastolic dysfunction, hepatitis C and hypertension. His history of substance abuse and is on chronic methadone maintenance therapy. Presented to the hospital with fever, chest discomfort and coughing. Found to have left lobar pneumonia. He was admitted to the hospital for further evaluation.  Discharge Diagnoses:  Principal Problem:   HCAP (healthcare-associated pneumonia) Active Problems:   Anemia   Substance abuse   Malignant hypertension   Methadone maintenance therapy patient (Terrance Reynolds)   Diastolic dysfunction   ESRD (end stage renal disease) (Terrance Reynolds)   Tobacco use disorder  1. HCAP. Started on vancomycin and Azactam. Blood cultures showed no growth at 2 days. Fevers appears to have improved. Clinically the patient is feeling better. He is breathing comfortably. Will transition to levaquin.  2. Leukocytosis. Possibly related to infectious process, although the patient does report being prescribed prednisone for his back pain which he was taking up until admission. He does not appear toxic. Wbc count trending down. Discontinue steroids.  3. End-stage renal disease. Nephrology following. Patient has been noncompliant with dialysis. He was dialyzed on 10/9 and says he will follow up with dialysis center on 10/11  4. Hypertension. Blood pressures better after dialysis. Continue home  medications.  5. Anemia. Likely related to chronic disease. Hemoglobin trending down without obvious signs of bleeding. Patient was offered transfusion of prbc, but has refused. He wants to have cbc rechecked at dialysis tomorrow and if lower, will consider prbc transfusion  6. Substance abuse. Continue to follow with the methadone clinic after discharge.  Discharge Instructions  Discharge Instructions    Diet - low sodium heart healthy    Complete by:  As directed    Increase activity slowly    Complete by:  As directed        Medication List    STOP taking these medications   predniSONE 10 MG tablet Commonly known as:  DELTASONE   triamcinolone cream 0.1 % Commonly known as:  KENALOG     TAKE these medications   amLODipine 5 MG tablet Commonly known as:  NORVASC Take 5 mg by mouth daily.   cyclobenzaprine 5 MG tablet Commonly known as:  FLEXERIL Take 1 tablet (5 mg total) by mouth 3 (three) times daily as needed for muscle spasms.   furosemide 80 MG tablet Commonly known as:  LASIX Take 80 mg by mouth daily.   guaiFENesin 600 MG 12 hr tablet Commonly known as:  MUCINEX Take 1 tablet (600 mg total) by mouth 2 (two) times daily.   hydrALAZINE 50 MG tablet Commonly known as:  APRESOLINE Take 1 tablet (50 mg total) by mouth every 8 (eight) hours.   labetalol 300 MG tablet Commonly known as:  NORMODYNE Take 1 tablet (300 mg total) by mouth 2 (two) times daily.   levofloxacin 500 MG tablet Commonly known as:  LEVAQUIN Take 1 tablet (500 mg total) by mouth every other day.   METHADONE HCL PO Take 100 mg by  mouth daily.   multivitamin with minerals Tabs tablet Take 1 tablet by mouth daily.   sevelamer carbonate 800 MG tablet Commonly known as:  RENVELA Take 800-1,600 mg by mouth 3 (three) times daily with meals. Take 2 tablets with meals, and 1 tablet with snacks       Allergies  Allergen Reactions  . Penicillins Anaphylaxis    Child hood allergy,  swelling of throat as a child Has patient had a PCN reaction causing immediate rash, facial/tongue/throat swelling, SOB or lightheadedness with hypotension: unknown Has patient had a PCN reaction causing severe rash involving mucus membranes or skin necrosis: unknown Has patient had a PCN reaction that required hospitalization unknown Has patient had a PCN reaction occurring within the last 10 years:  unknown    Consultations:  Nephrology   Procedures/Studies: Dg Chest 2 View  Result Date: 07/25/2016 CLINICAL DATA:  Shortness of breath, high fever. EXAM: CHEST  2 VIEW COMPARISON:  Chest radiograph 01/14/2016 FINDINGS: Dual-lumen central venous catheter from right internal jugular approach terminates at the cavoatrial junction and right atrium. Cardiomediastinal silhouette is normal. Mediastinal contours appear intact. There is no evidence of pleural effusion or pneumothorax. There is dense opacification of the left lower lobe. Osseous structures are without acute abnormality. Soft tissues are grossly normal. IMPRESSION: Dense airspace consolidation of the left lower lobe consistent with lobar pneumonia. Electronically Signed   By: Fidela Salisbury M.D.   On: 07/25/2016 15:24       Subjective: Feeling better no shortness of breath. Minimal cough  Discharge Exam: Vitals:   07/27/16 0546 07/27/16 0754  BP: (!) 168/96   Pulse: 88 68  Resp: 20 14  Temp: 98.5 F (36.9 C)    Vitals:   07/26/16 2305 07/26/16 2317 07/27/16 0546 07/27/16 0754  BP: (!) 162/97  (!) 168/96   Pulse: 82  88 68  Resp: 16  20 14   Temp: 98.2 F (36.8 C)  98.5 F (36.9 C)   TempSrc: Oral  Oral   SpO2: 97% 98% 98% 94%  Weight:   84.8 kg (186 lb 15.2 oz)   Height:        General: Pt is alert, awake, not in acute distress Cardiovascular: RRR, S1/S2 +, no rubs, no gallops Respiratory: CTA bilaterally, no wheezing, no rhonchi Abdominal: Soft, NT, ND, bowel sounds + Extremities: no edema, no  cyanosis    The results of significant diagnostics from this hospitalization (including imaging, microbiology, ancillary and laboratory) are listed below for reference.     Microbiology: Recent Results (from the past 240 hour(s))  Culture, blood (routine x 2) Call MD if unable to obtain prior to antibiotics being given     Status: None (Preliminary result)   Collection Time: 07/25/16  2:47 PM  Result Value Ref Range Status   Specimen Description RIGHT ANTECUBITAL  Final   Special Requests BOTTLES DRAWN AEROBIC AND ANAEROBIC [place]  Final   Culture NO GROWTH 2 DAYS  Final   Report Status PENDING  Incomplete  Culture, blood (routine x 2) Call MD if unable to obtain prior to antibiotics being given     Status: None (Preliminary result)   Collection Time: 07/25/16 11:13 PM  Result Value Ref Range Status   Specimen Description LEFT ANTECUBITAL  Final   Special Requests BOTTLES DRAWN AEROBIC AND ANAEROBIC [place]  Final   Culture NO GROWTH 2 DAYS  Final   Report Status PENDING  Incomplete     Labs: BNP (last  3 results) No results for input(s): BNP in the last 8760 hours. Basic Metabolic Panel:  Recent Labs Lab 07/25/16 1509 07/26/16 0614 07/27/16 0550  NA 134* 132* 135  K 3.7 5.2* 3.2*  CL 102 104 99*  CO2 21* 17* 27  GLUCOSE 86 63* 113*  BUN 99* 112* 45*  CREATININE 8.49* 9.01* 5.01*  CALCIUM 8.1* 7.0* 7.6*  PHOS  --   --  3.7   Liver Function Tests:  Recent Labs Lab 07/27/16 0550  ALBUMIN 2.4*   No results for input(s): LIPASE, AMYLASE in the last 168 hours. No results for input(s): AMMONIA in the last 168 hours. CBC:  Recent Labs Lab 07/25/16 1509 07/26/16 0614 07/27/16 0550  WBC 26.5* 28.1* 16.2*  NEUTROABS  --  24.7*  --   HGB 9.4* 8.4* 7.4*  HCT 28.9* 25.7* 22.6*  MCV 84.3 84.8 84.0  PLT 354 277 234   Cardiac Enzymes: No results for input(s): CKTOTAL, CKMB, CKMBINDEX, TROPONINI in the last 168 hours. BNP: Invalid input(s): POCBNP CBG: No  results for input(s): GLUCAP in the last 168 hours. D-Dimer No results for input(s): DDIMER in the last 72 hours. Hgb A1c No results for input(s): HGBA1C in the last 72 hours. Lipid Profile No results for input(s): CHOL, HDL, LDLCALC, TRIG, CHOLHDL, LDLDIRECT in the last 72 hours. Thyroid function studies No results for input(s): TSH, T4TOTAL, T3FREE, THYROIDAB in the last 72 hours.  Invalid input(s): FREET3 Anemia work up No results for input(s): VITAMINB12, FOLATE, FERRITIN, TIBC, IRON, RETICCTPCT in the last 72 hours. Urinalysis    Component Value Date/Time   COLORURINE YELLOW 03/31/2016 1150   APPEARANCEUR CLEAR 03/31/2016 1150   LABSPEC 1.020 03/31/2016 1150   PHURINE 6.0 03/31/2016 1150   GLUCOSEU NEGATIVE 03/31/2016 1150   HGBUR SMALL (A) 03/31/2016 1150   BILIRUBINUR NEGATIVE 03/31/2016 1150   KETONESUR NEGATIVE 03/31/2016 1150   PROTEINUR >300 (A) 03/31/2016 1150   UROBILINOGEN 0.2 07/08/2015 1029   NITRITE NEGATIVE 03/31/2016 1150   LEUKOCYTESUR NEGATIVE 03/31/2016 1150   Sepsis Labs Invalid input(s): PROCALCITONIN,  WBC,  LACTICIDVEN Microbiology Recent Results (from the past 240 hour(s))  Culture, blood (routine x 2) Call MD if unable to obtain prior to antibiotics being given     Status: None (Preliminary result)   Collection Time: 07/25/16  2:47 PM  Result Value Ref Range Status   Specimen Description RIGHT ANTECUBITAL  Final   Special Requests BOTTLES DRAWN AEROBIC AND ANAEROBIC 6CC EACH  Final   Culture NO GROWTH 2 DAYS  Final   Report Status PENDING  Incomplete  Culture, blood (routine x 2) Call MD if unable to obtain prior to antibiotics being given     Status: None (Preliminary result)   Collection Time: 07/25/16 11:13 PM  Result Value Ref Range Status   Specimen Description LEFT ANTECUBITAL  Final   Special Requests BOTTLES DRAWN AEROBIC AND ANAEROBIC [place]  Final   Culture NO GROWTH 2 DAYS  Final   Report Status PENDING  Incomplete     Time  coordinating discharge: Over 30 minutes  SIGNED:   Kathie Dike, MD  [facility] 07/27/2016, 12:56 PM Pager   If 7PM-7AM, please contact night-coverage [email or web address] Password [email or web address]

## 2016-07-27 NOTE — Care Management Note (Signed)
Case Management Note  Patient Details  Name: Terrance Reynolds MRN: [id number] Date of Birth: 11-09-1980  Pt admitted for HCAP. Chart reviewed for CM needs. Pt is from home, lives with family and is ind with ADL's. Pt has PCP, transportation and insurance with drug coverage. He plans to return home with self care. Pt discharging home today. No CM needs.   Expected Discharge Date:    07/27/2016              Expected Discharge Plan:  Home/Self Care  In-House Referral:  NA  Discharge planning Services  CM Consult  Post Acute Care Choice:  NA Choice offered to:  NA  DME Arranged:    DME Agency:     HH Arranged:    HH Agency:     Status of Service:  Completed, signed off  If discussed at [employer] of Stay Meetings, dates discussed:    Additional Comments:  Sherald Barge, RN 07/27/2016, 1:02 PM

## 2016-07-30 LAB — CULTURE, BLOOD (ROUTINE X 2)
CULTURE: NO GROWTH
Culture: NO GROWTH

## 2016-08-13 ENCOUNTER — Emergency Department (HOSPITAL_COMMUNITY)
Admission: EM | Admit: 2016-08-13 | Discharge: 2016-08-13 | Disposition: A | Discharge status: Home / Self Care | Payer: Medicaid Other | Attending: Emergency Medicine | Admitting: Emergency Medicine

## 2016-08-13 ENCOUNTER — Encounter (HOSPITAL_COMMUNITY): Payer: Self-pay | Admitting: Emergency Medicine

## 2016-08-13 ENCOUNTER — Emergency Department (HOSPITAL_COMMUNITY): Payer: Medicaid Other

## 2016-08-13 DIAGNOSIS — M5441 Lumbago with sciatica, right side: Secondary | ICD-10-CM | POA: Diagnosis not present

## 2016-08-13 DIAGNOSIS — F17210 Nicotine dependence, cigarettes, uncomplicated: Secondary | ICD-10-CM | POA: Insufficient documentation

## 2016-08-13 DIAGNOSIS — N186 End stage renal disease: Secondary | ICD-10-CM | POA: Insufficient documentation

## 2016-08-13 DIAGNOSIS — J40 Bronchitis, not specified as acute or chronic: Secondary | ICD-10-CM | POA: Diagnosis not present

## 2016-08-13 DIAGNOSIS — I120 Hypertensive chronic kidney disease with stage 5 chronic kidney disease or end stage renal disease: Secondary | ICD-10-CM | POA: Diagnosis not present

## 2016-08-13 DIAGNOSIS — G8929 Other chronic pain: Secondary | ICD-10-CM | POA: Insufficient documentation

## 2016-08-13 DIAGNOSIS — M545 Low back pain: Secondary | ICD-10-CM | POA: Diagnosis present

## 2016-08-13 DIAGNOSIS — Z79899 Other long term (current) drug therapy: Secondary | ICD-10-CM | POA: Diagnosis not present

## 2016-08-13 DIAGNOSIS — Z992 Dependence on renal dialysis: Secondary | ICD-10-CM | POA: Insufficient documentation

## 2016-08-13 MED ORDER — PREDNISONE 10 MG PO TABS: ORAL_TABLET | ORAL | 0 refills | Status: AC

## 2016-08-13 MED ORDER — ALBUTEROL SULFATE (2.5 MG/3ML) 0.083% IN NEBU
2.5000 mg | INHALATION_SOLUTION | Freq: Once | RESPIRATORY_TRACT | Status: AC
  Administered 2016-08-13: 2.5 mg via RESPIRATORY_TRACT
  Filled 2016-08-13: qty 3

## 2016-08-13 MED ORDER — IPRATROPIUM-ALBUTEROL 0.5-2.5 (3) MG/3ML IN SOLN
3.0000 mL | Freq: Once | RESPIRATORY_TRACT | Status: AC
  Administered 2016-08-13: 3 mL via RESPIRATORY_TRACT
  Filled 2016-08-13: qty 3

## 2016-08-13 MED ORDER — ALBUTEROL SULFATE HFA 108 (90 BASE) MCG/ACT IN AERS: 1.0000 | INHALATION_SPRAY | Freq: Four times a day (QID) | RESPIRATORY_TRACT | 0 refills | Status: AC | PRN

## 2016-08-13 MED ORDER — KETOROLAC TROMETHAMINE 60 MG/2ML IM SOLN
60.0000 mg | Freq: Once | INTRAMUSCULAR | Status: AC
  Administered 2016-08-13: 60 mg via INTRAMUSCULAR
  Filled 2016-08-13: qty 2

## 2016-08-13 MED ORDER — CYCLOBENZAPRINE HCL 10 MG PO TABS
10.0000 mg | ORAL_TABLET | Freq: Once | ORAL | Status: AC
  Administered 2016-08-13: 10 mg via ORAL
  Filled 2016-08-13: qty 1

## 2016-08-13 MED ORDER — CYCLOBENZAPRINE HCL 10 MG PO TABS: 10.0000 mg | ORAL_TABLET | Freq: Three times a day (TID) | ORAL | 0 refills | Status: AC | PRN

## 2016-08-13 NOTE — ED Notes (Signed)
RT paged.

## 2016-08-13 NOTE — Discharge Instructions (Signed)
Follow-up with your primary doctor regarding better control of your high blood pressure.  You can contact the neurosurgeon group listed to arrange a follow-up appt.

## 2016-08-13 NOTE — ED Notes (Signed)
Terrance Reynolds at bedside. 

## 2016-08-13 NOTE — ED Provider Notes (Signed)
[facility] DEPT Provider Note   CSN: [id number] Arrival date & time: 08/13/16  1119     History   Chief Complaint Chief Complaint  Patient presents with  . Back Pain  . Cough    HPI Terrance Reynolds is a 35 y.o. male.  HPI  Terrance Reynolds is a 35 y.o. male who presents to the Emergency Department complaining of recurrent low back pain that has been worsening for 2-3 days.  He states pain is similar to previous and radiates into his right leg.  He states that prednisone usually helps.  He denies recent injury, numbness or weakness of the extremity, abd pain, urine or bowel changes.  He also complains of cough and wheezing for 2 days. Cough has been productive of green sputum.  Coughing worse with exertion.  Denies fever chest pain and shortness of breath.  He states that he was recently admitted for PNA and thinks that his cough has not completely resolved.     Past Medical History:  Diagnosis Date  . Acute on chronic renal failure (HCC)    ESRD TThSAT  . Anemia   . Anxiety   . Arthritis   . CKD (chronic kidney disease)   . Constipation   . Dialysis patient ([place])   . Diastolic dysfunction 1/82/9937.   Grade 2 per echo  . GERD (gastroesophageal reflux disease)   . Headache    Migraines- due to headache  . Hepatitis 2016   hepatitis c  . Hypertension   . 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 Migraines   . Psoriasis   . Shortness of breath dyspnea    when I had a lot of fluid on  . Substance abuse    methadone clinic  . Thrombocytopenia [facility])     Patient Active Problem List   Diagnosis Date Noted  . HCAP (healthcare-associated pneumonia) 07/25/2016  . Tobacco use disorder 07/25/2016  . Complication of vascular access for dialysis 06/30/2016  . Numbness-Left wrist 07/03/2015  . ESRD (end stage renal disease) ([place])   . Renal failure 04/14/2015  . Chronic kidney disease, stage V ([place]) 04/14/2015  . Metabolic acidosis 16/96/7893  . Pulmonary  vascular congestion 04/14/2015  . Bilateral leg edema 04/14/2015  . Malignant hypertension 04/14/2015  . 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 Diastolic dysfunction 81/10/7508  . Headache   . Hepatitis C 04/02/2015  . Acute renal failure ([place]) 04/01/2015  . Acute on chronic renal failure ([place]) 04/01/2015  . Hypertensive crisis 04/01/2015  . Elevated troponin 04/01/2015  . Dyspnea 04/01/2015  . Leg edema 04/01/2015  . Anemia 04/01/2015  . Thrombocytopenia ([place]) 04/01/2015  . Substance abuse 04/01/2015    Past Surgical History:  Procedure Laterality Date  . AV FISTULA PLACEMENT Left 06/18/2015   Procedure: LEFT RADIOCEPHALIC ARTERIOVENOUS (AV) FISTULA CREATION;  Surgeon: Elam Dutch, MD;  Location: [place];  Service: Vascular;  Laterality: Left;  . AV FISTULA PLACEMENT Left 12/12/2015   Procedure: INSERTION OF ARTERIOVENOUS (AV) GORE-TEX GRAFT [place]  ( BRACH/AXILLARY GRAFT );  Surgeon: Katha Cabal, MD;  Location: [facility];  Service: Vascular;  Laterality: Left;  . [place] REMOVAL Left 06/30/2016   Procedure: REMOVAL OF ARTERIOVENOUS GORETEX GRAFT ([place]);  Surgeon: Katha Cabal, MD;  Location: [facility];  Service: Vascular;  Laterality: Left;  . BASCILIC VEIN TRANSPOSITION Left 07/09/2015   Procedure: LEFT BRACHIOCEPHALIC ARTERIOVENOUS FISTULA;  Surgeon: Elam Dutch, MD;  Location: [place];  Service: Vascular;  Laterality: Left;  . CAPD INSERTION N/A 12/12/2015   Procedure: Removal of peritoneal dialysis catheter;  Surgeon: Katha Cabal, MD;  Location: [facility];  Service: Vascular;  Laterality: N/A;  . HEMATOMA EVACUATION Left 05/14/2016   Procedure: EVACUATION HEMATOMA ( DRAINAGE );  Surgeon: Katha Cabal, MD;  Location: [facility];  Service: Vascular;  Laterality: Left;  . INSERTION OF DIALYSIS CATHETER N/A 09/03/2015   Procedure: INSERTION OF PERITONEAL DIALYSIS CATHETER;  Surgeon: Katha Cabal, MD;  Location: [facility];  Service: Vascular;   Laterality: N/A;  . PERIPHERAL VASCULAR CATHETERIZATION Left 04/23/2016   Procedure: A/V Shuntogram/Fistulagram;  Surgeon: Katha Cabal, MD;  Location: [facility];  Service: Cardiovascular;  Laterality: Left;  . PERIPHERAL VASCULAR CATHETERIZATION N/A 04/23/2016   Procedure: A/V Shunt Intervention;  Surgeon: Katha Cabal, MD;  Location: [place] [facility];  Service: Cardiovascular;  Laterality: N/A;  . PERIPHERAL VASCULAR CATHETERIZATION Right 06/08/2016   Procedure: A/V Shuntogram/Fistulagram;  Surgeon: Katha Cabal, MD;  Location: [place] [facility];  Service: Cardiovascular;  Laterality: Right;  . PERIPHERAL VASCULAR CATHETERIZATION N/A 06/08/2016   Procedure: A/V Shunt Intervention;  Surgeon: Katha Cabal, MD;  Location: [place] [facility];  Service: Cardiovascular;  Laterality: N/A;  . permacath Right   . REVISION OF ARTERIOVENOUS GORETEX GRAFT Left 05/14/2016   Procedure: REVISION OF ARTERIOVENOUS GORETEX GRAFT ( [place] );  Surgeon: Katha Cabal, MD;  Location: [facility];  Service: Vascular;  Laterality: Left;       Home Medications    Prior to Admission medications   Medication Sig Start Date End Date Taking? Authorizing Provider  amLODipine (NORVASC) 5 MG tablet Take 5 mg by mouth daily.   Yes Historical Provider, MD  furosemide (LASIX) 80 MG tablet Take 80 mg by mouth daily.    Yes Historical Provider, MD  guaiFENesin (MUCINEX) 600 MG 12 hr tablet Take 1 tablet (600 mg total) by mouth 2 (two) times daily. 07/27/16  Yes Kathie Dike, MD  hydrALAZINE (APRESOLINE) 50 MG tablet Take 1 tablet (50 mg total) by mouth every 8 (eight) hours. 04/17/15  Yes Lezlie Octave Black, NP  labetalol (NORMODYNE) 300 MG tablet Take 1 tablet (300 mg total) by mouth 2 (two) times daily. 04/17/15  Yes Lezlie Octave Black, NP  methadone (DOLOPHINE) 10 MG tablet Take 10 tablets (100 mg total) by mouth at bedtime. Patient taking differently: Take 120 mg by mouth daily.  07/27/16  Yes  Kathie Dike, MD  Multiple Vitamin (MULTIVITAMIN WITH MINERALS) TABS tablet Take 1 tablet by mouth daily.   Yes Historical Provider, MD  PRESCRIPTION MEDICATION Apply 1 application topically daily as needed. Cream used for psoriasis.   Yes Historical Provider, MD  sevelamer carbonate (RENVELA) 800 MG tablet Take 800-1,600 mg by mouth 3 (three) times daily with meals. Take 2 tablets with meals, and 1 tablet with snacks   Yes Historical Provider, MD  cyclobenzaprine (FLEXERIL) 5 MG tablet Take 1 tablet (5 mg total) by mouth 3 (three) times daily as needed for muscle spasms. 07/19/16   Elnora Morrison, MD  levofloxacin (LEVAQUIN) 500 MG tablet Take 1 tablet (500 mg total) by mouth every other day. 07/27/16   Kathie Dike, MD    Family History Family History  Problem Relation Age of Onset  . Diabetes Mother   . Heart disease Father   . Hypertension Father     Social History Social History  Substance Use Topics  . Smoking status:  Current Every Day Smoker    Packs/day: 1.00    Years: 15.00    Types: Cigarettes  . Smokeless tobacco: Never Used     Comment: 10 years  off and on  . Alcohol use No     Allergies   Penicillins   Review of Systems Review of Systems  Constitutional: Negative for fever.  Respiratory: Positive for wheezing. Negative for shortness of breath.   Gastrointestinal: Negative for abdominal pain, constipation and vomiting.  Genitourinary: Negative for decreased urine volume, difficulty urinating, dysuria, flank pain and hematuria.  Musculoskeletal: Positive for back pain. Negative for joint swelling.  Skin: Negative for rash.  Neurological: Negative for weakness and numbness.  All other systems reviewed and are negative.    Physical Exam Updated Vital Signs BP (!) 192/121 (BP Location: Left Arm)   Pulse 94   Temp 98.1 F (36.7 C) (Oral)   Resp 20   SpO2 98%   Physical Exam  Constitutional: He is oriented to person, place, and time. He appears  well-developed and well-nourished. No distress.  HENT:  Head: Normocephalic and atraumatic.  Neck: Normal range of motion. Neck supple.  Cardiovascular: Normal rate, regular rhythm and intact distal pulses.   No murmur heard. Pulmonary/Chest: Effort normal. No respiratory distress. He has wheezes.  Abdominal: Soft. He exhibits no distension. There is no tenderness.  Musculoskeletal: He exhibits tenderness. He exhibits no edema.       Lumbar back: He exhibits tenderness and pain. He exhibits normal range of motion, no swelling, no deformity, no laceration and normal pulse.  ttp of the lower lumbar paraspinal muscles and right Si joint space. DP pulses are brisk and symmetrical.  Distal sensation intact.  Pt has 5/5 strength against resistance of bilateral lower extremities.     Neurological: He is alert and oriented to person, place, and time. He has normal strength. No sensory deficit. He exhibits normal muscle tone. Coordination and gait normal.  Reflex Scores:      Patellar reflexes are 2+ on the right side and 2+ on the left side.      Achilles reflexes are 2+ on the right side and 2+ on the left side. Skin: Skin is warm and dry. No rash noted.  Nursing note and vitals reviewed.    ED Treatments / Results  Labs (all labs ordered are listed, but only abnormal results are displayed) Labs Reviewed - No data to display  EKG  EKG Interpretation None       Radiology Dg Chest 2 View  Result Date: 08/13/2016 CLINICAL DATA:  Recent pneumonia. Productive cough. Smoking history. EXAM: CHEST  2 VIEW COMPARISON:  Two-view chest x-ray 07/25/2016 FINDINGS: The heart size is normal. Dialysis catheter is stable. The lungs are now clear. Previously seen left lower lobe pneumonia has cleared. Emphysematous changes are again noted. Mild rightward curvature is present in the mid thoracic spine. IMPRESSION: 1. Interval clearing of left lower lobe airspace disease. 2. Emphysema. Electronically  Signed   By: San Morelle M.D.   On: 08/13/2016 12:16    Procedures Procedures (including critical care time)  Medications Ordered in ED Medications  ipratropium-albuterol (DUONEB) 0.5-2.5 (3) MG/3ML nebulizer solution 3 mL (3 mLs Nebulization Given 08/13/16 1305)  albuterol (PROVENTIL) (2.5 MG/3ML) 0.083% nebulizer solution 2.5 mg (2.5 mg Nebulization Given 08/13/16 1305)  cyclobenzaprine (FLEXERIL) tablet 10 mg (10 mg Oral Given 08/13/16 1247)  ketorolac (TORADOL) injection 60 mg (60 mg Intramuscular Given 08/13/16 1248)     Initial  Impression / Assessment and Plan / ED Course  I have reviewed the triage vital signs and the nursing notes.  Pertinent labs & imaging results that were available during my care of the patient were reviewed by me and considered in my medical decision making (see chart for details).  Clinical Course    Pt requesting steroids stating that is only thing that helps his back pain.  I have reviewed his medical records and last received oral steroids one month ago with single IM injection 3 weeks ago.  I have discussed at length the risks of excessive steroid use and pt verbalized understanding.  Given that he is wheezing and has bronchospasm, I think a short course is reasonable.  He understands that he will need to make arrangements for further care of his back pain needs to come from neurosurgery or PMD.    Final Clinical Impressions(s) / ED Diagnoses   Final diagnoses:  Chronic midline low back pain with right-sided sciatica  Bronchitis    New Prescriptions New Prescriptions   No medications on file     Kem Parkinson, Hershal Coria 08/16/16 2320    Milton Ferguson, MD 08/17/16 1512

## 2016-08-13 NOTE — ED Triage Notes (Signed)
Pt c/o chronic lower back pain worse x 2 days. Pt also c/o productive cough with green sputum x 2 days.

## 2016-09-23 IMAGING — XA DG C-ARM 1-60 MIN-NO REPORT
2 series · 5 of 5 positions shown · non-contrast
Comparison: none

[Series 4: ortho standard · 1 of 1 slices shown (1 of 2)]
[im 1/1]
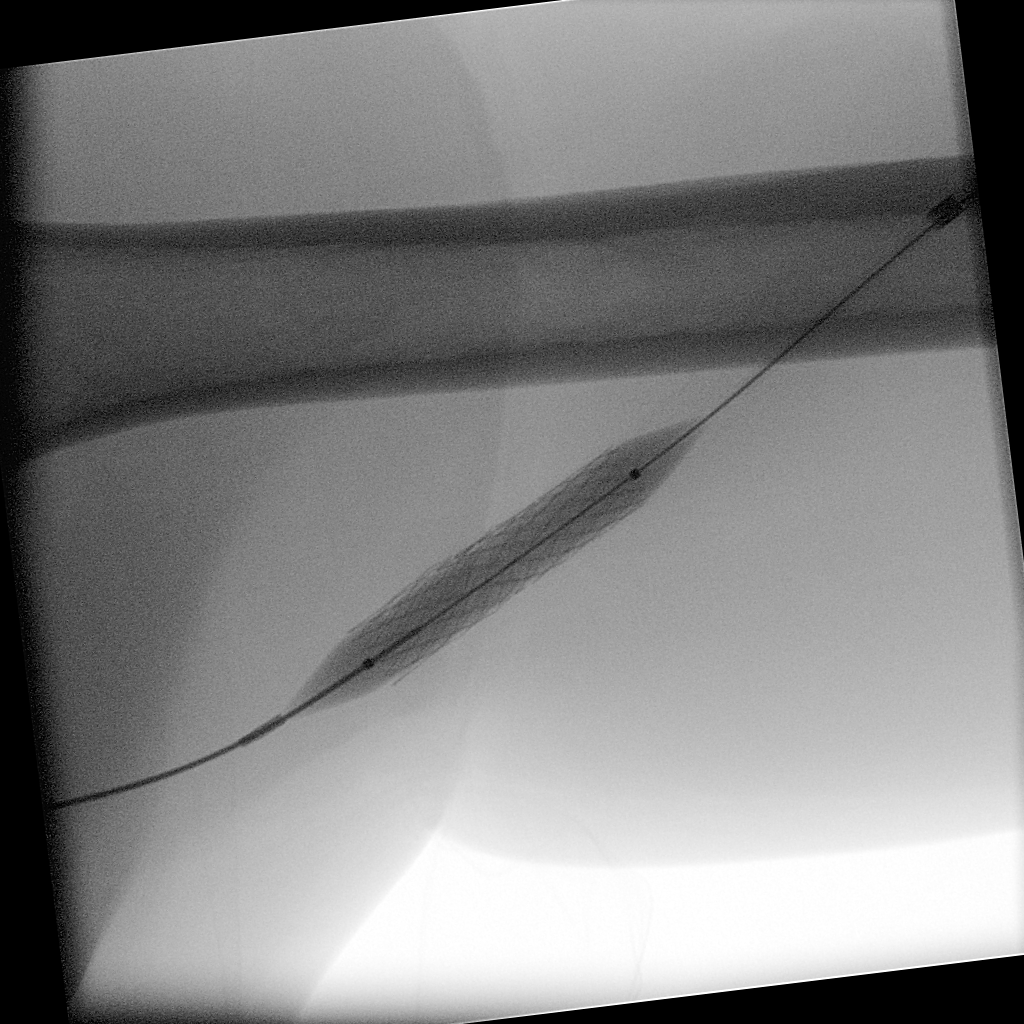

[Series 5: ortho standard · 4 of 61 frames shown (2 of 2)]
[frame 10/61]
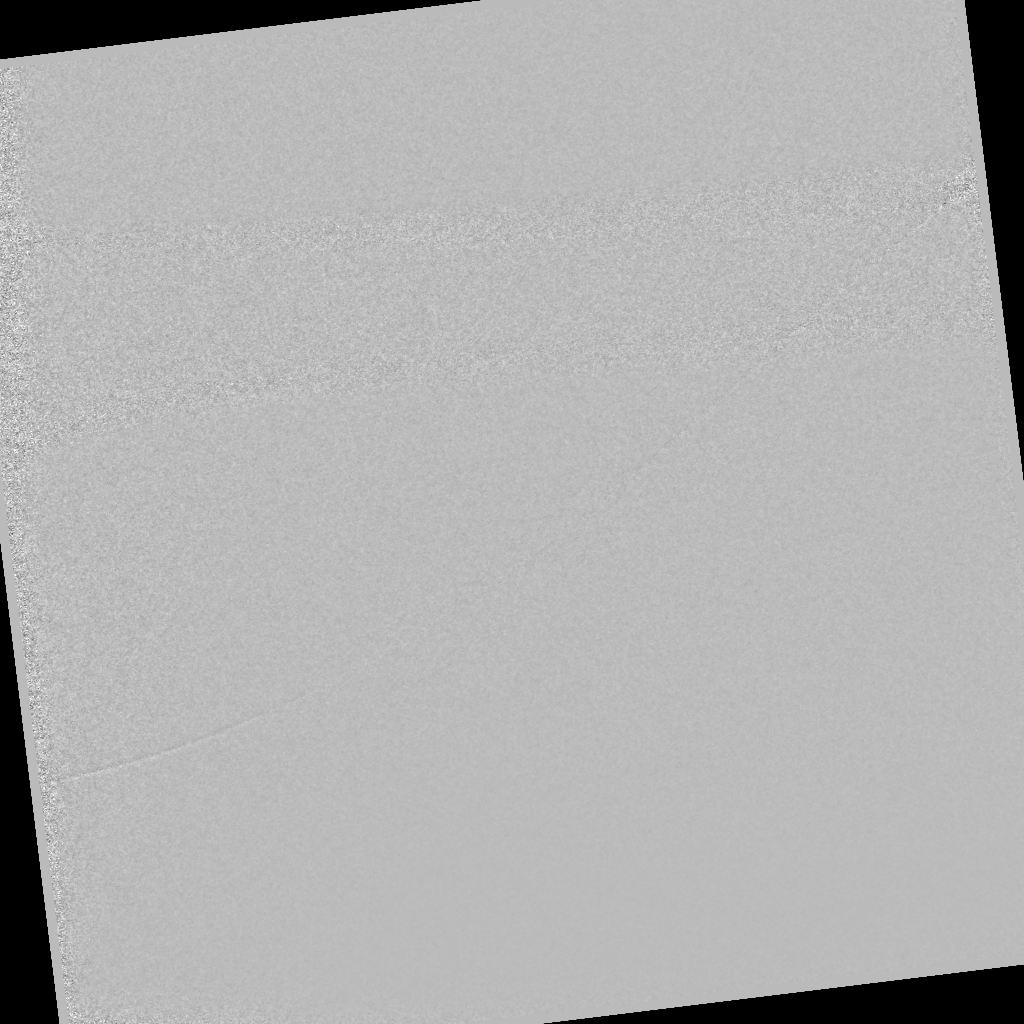
[frame 31/61]
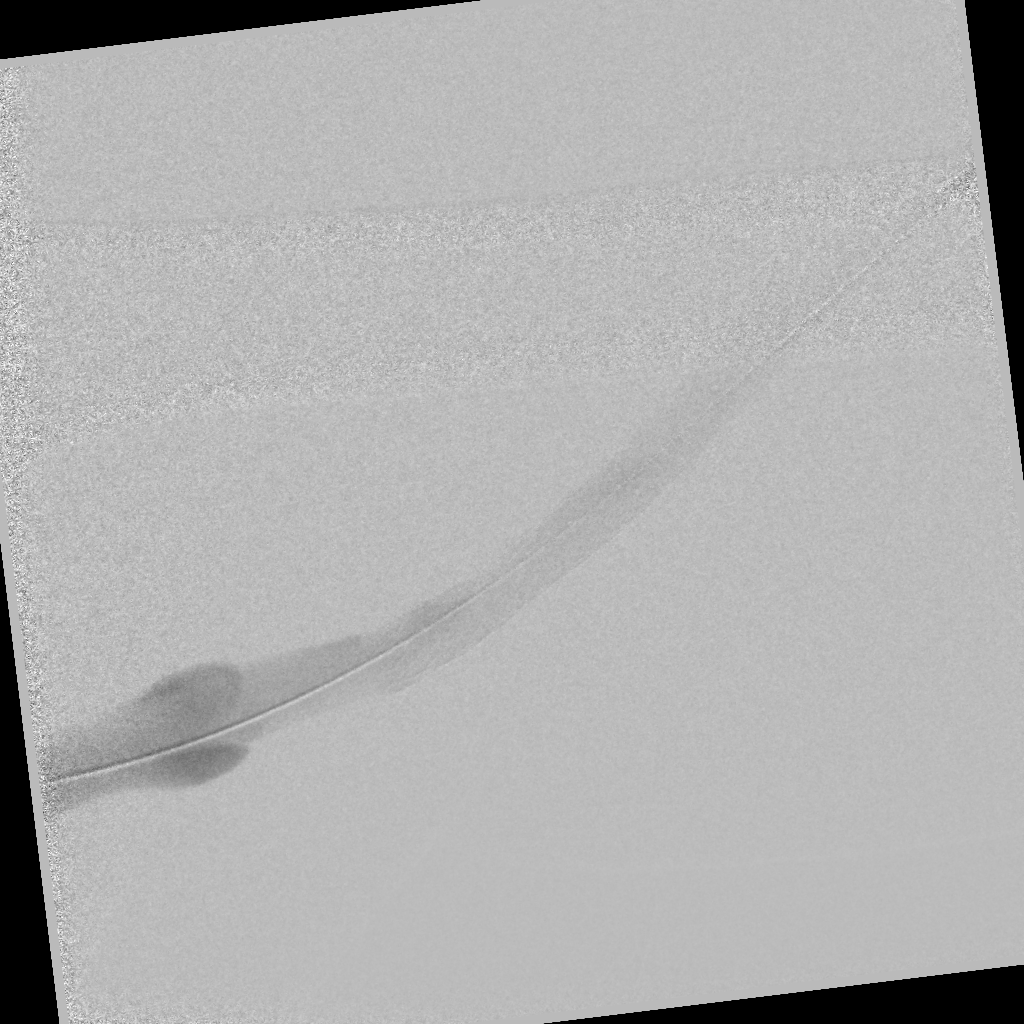
[frame 52/61]
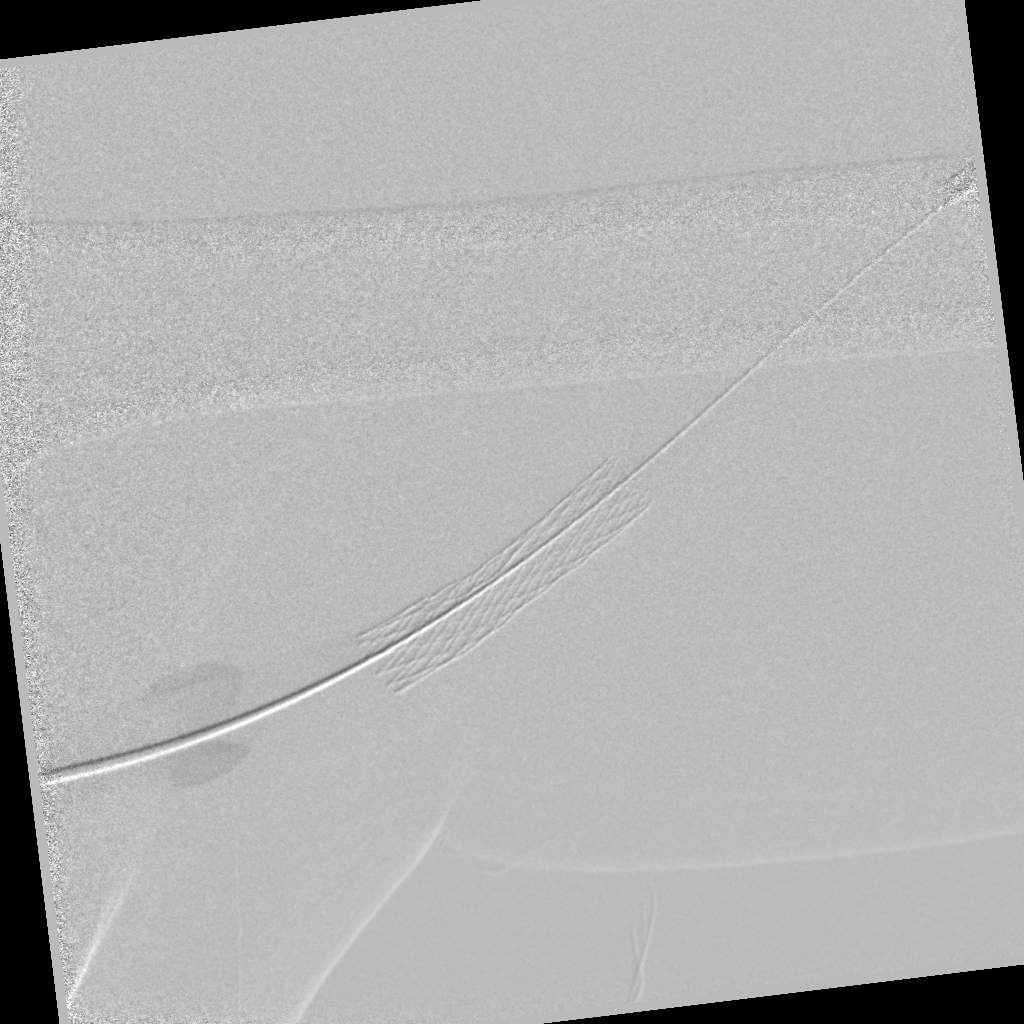
[frame 61/61]
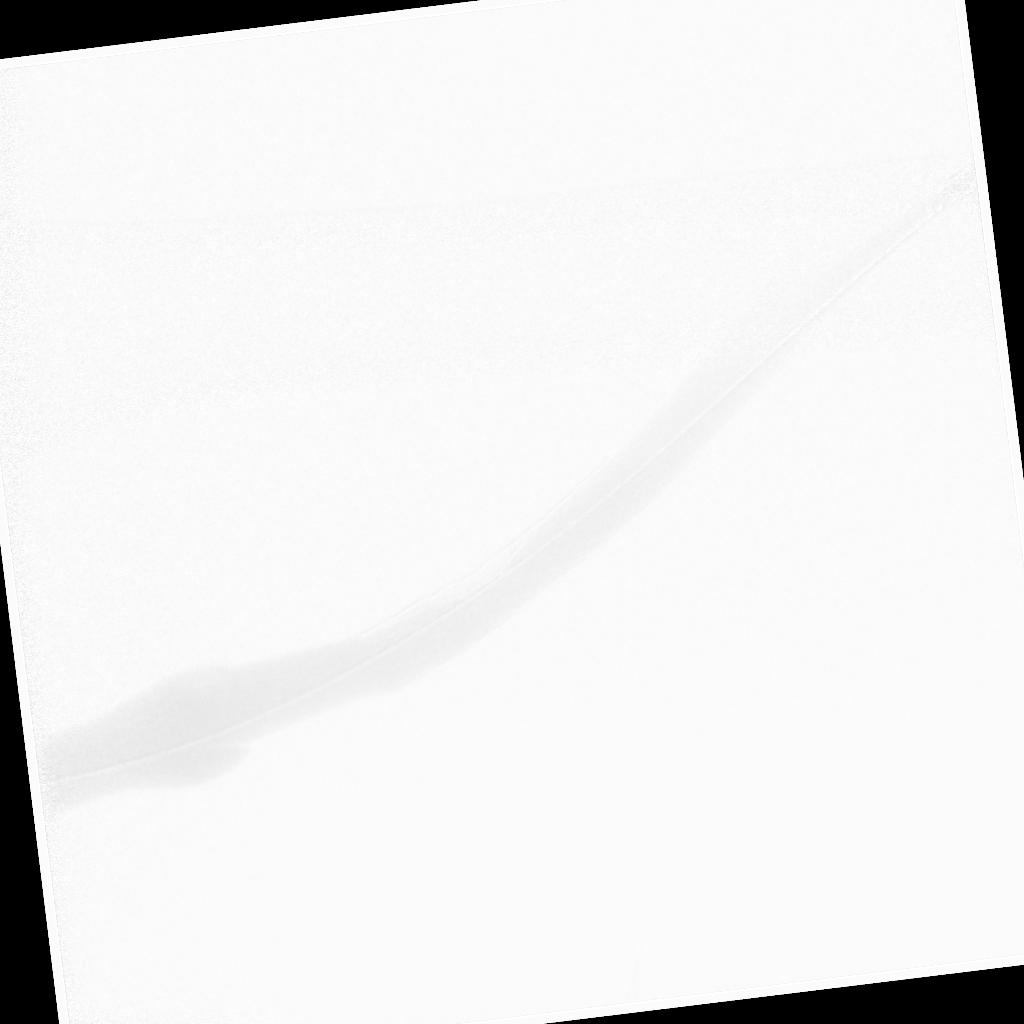

[5 of 5 positions shown; findings below may reference images not displayed]

:
Fluoroscopy was utilized by the requesting physician. No
radiographic interpretation.

## 2016-12-23 IMAGING — DX DG CHEST 2V
2 series · 2 of 2 positions shown · non-contrast
Comparison: Two-view chest x-ray 07/25/2016

CLINICAL DATA: Recent pneumonia. Productive cough. Smoking history.

EXAM:
CHEST  2 VIEW

[chest pa]
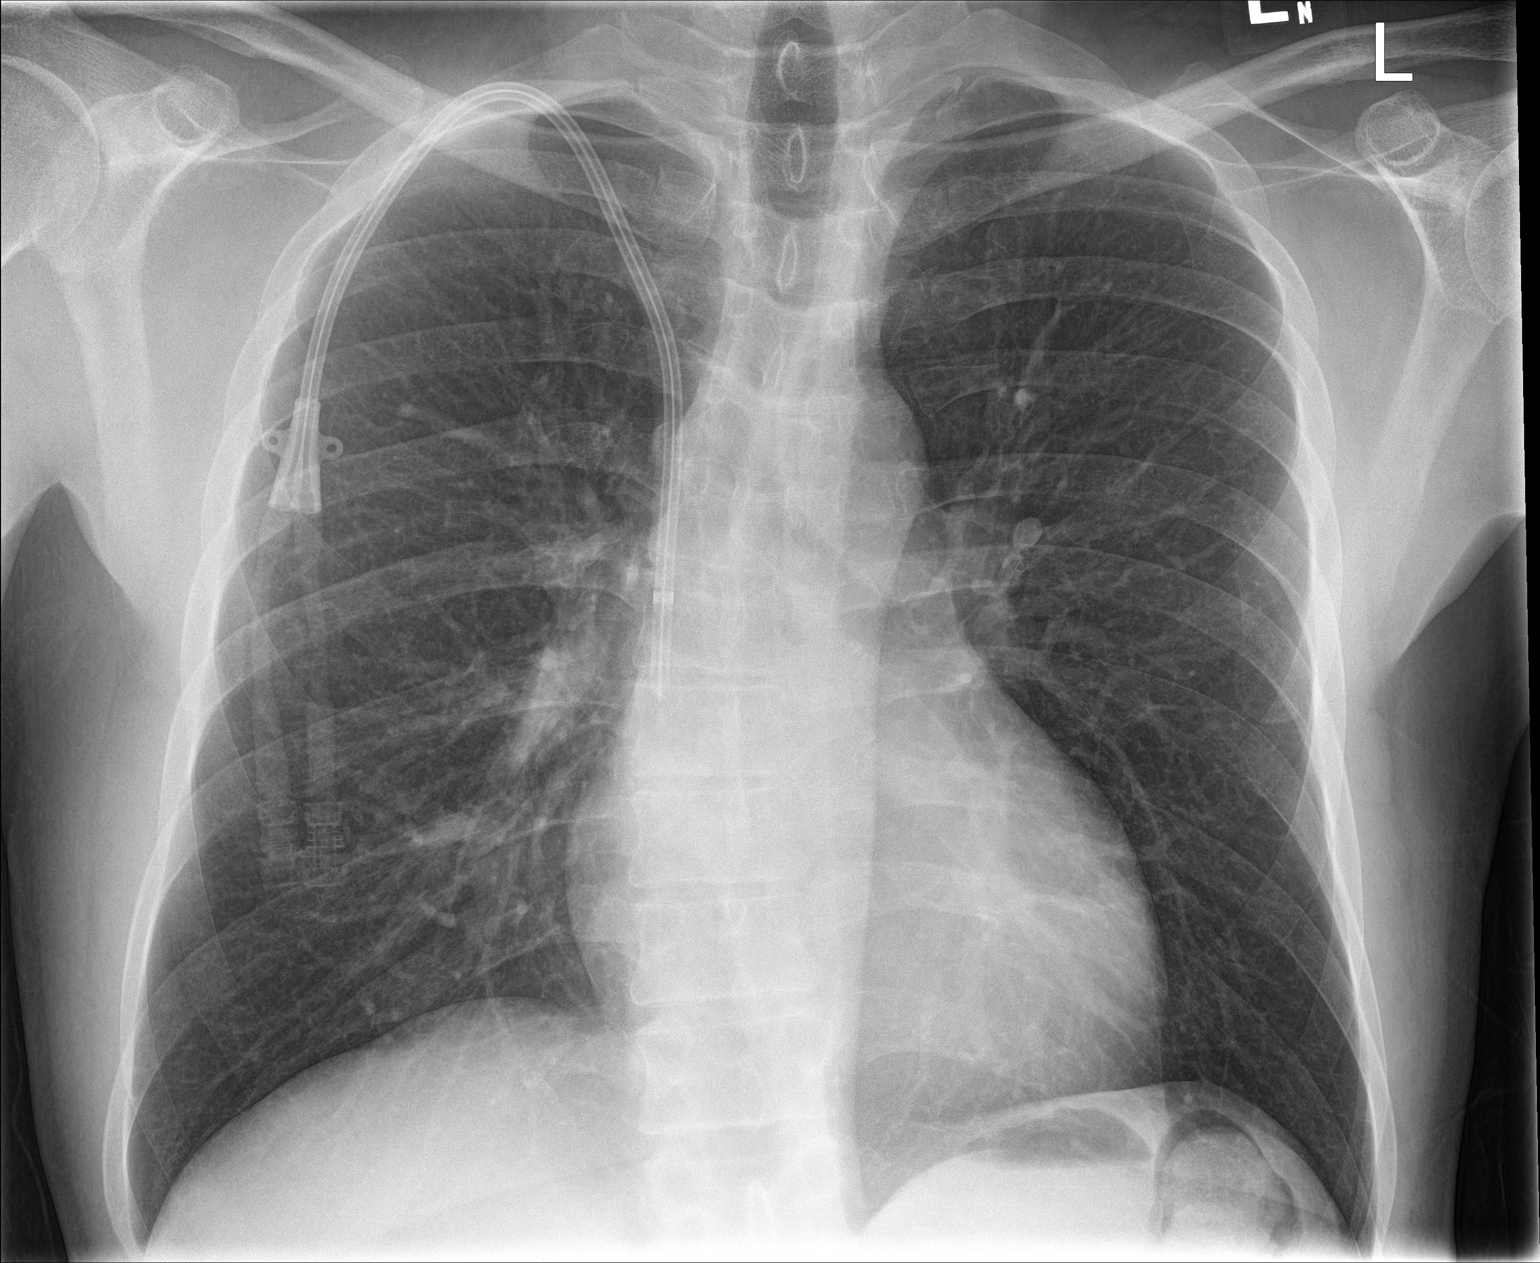

[chest lat]
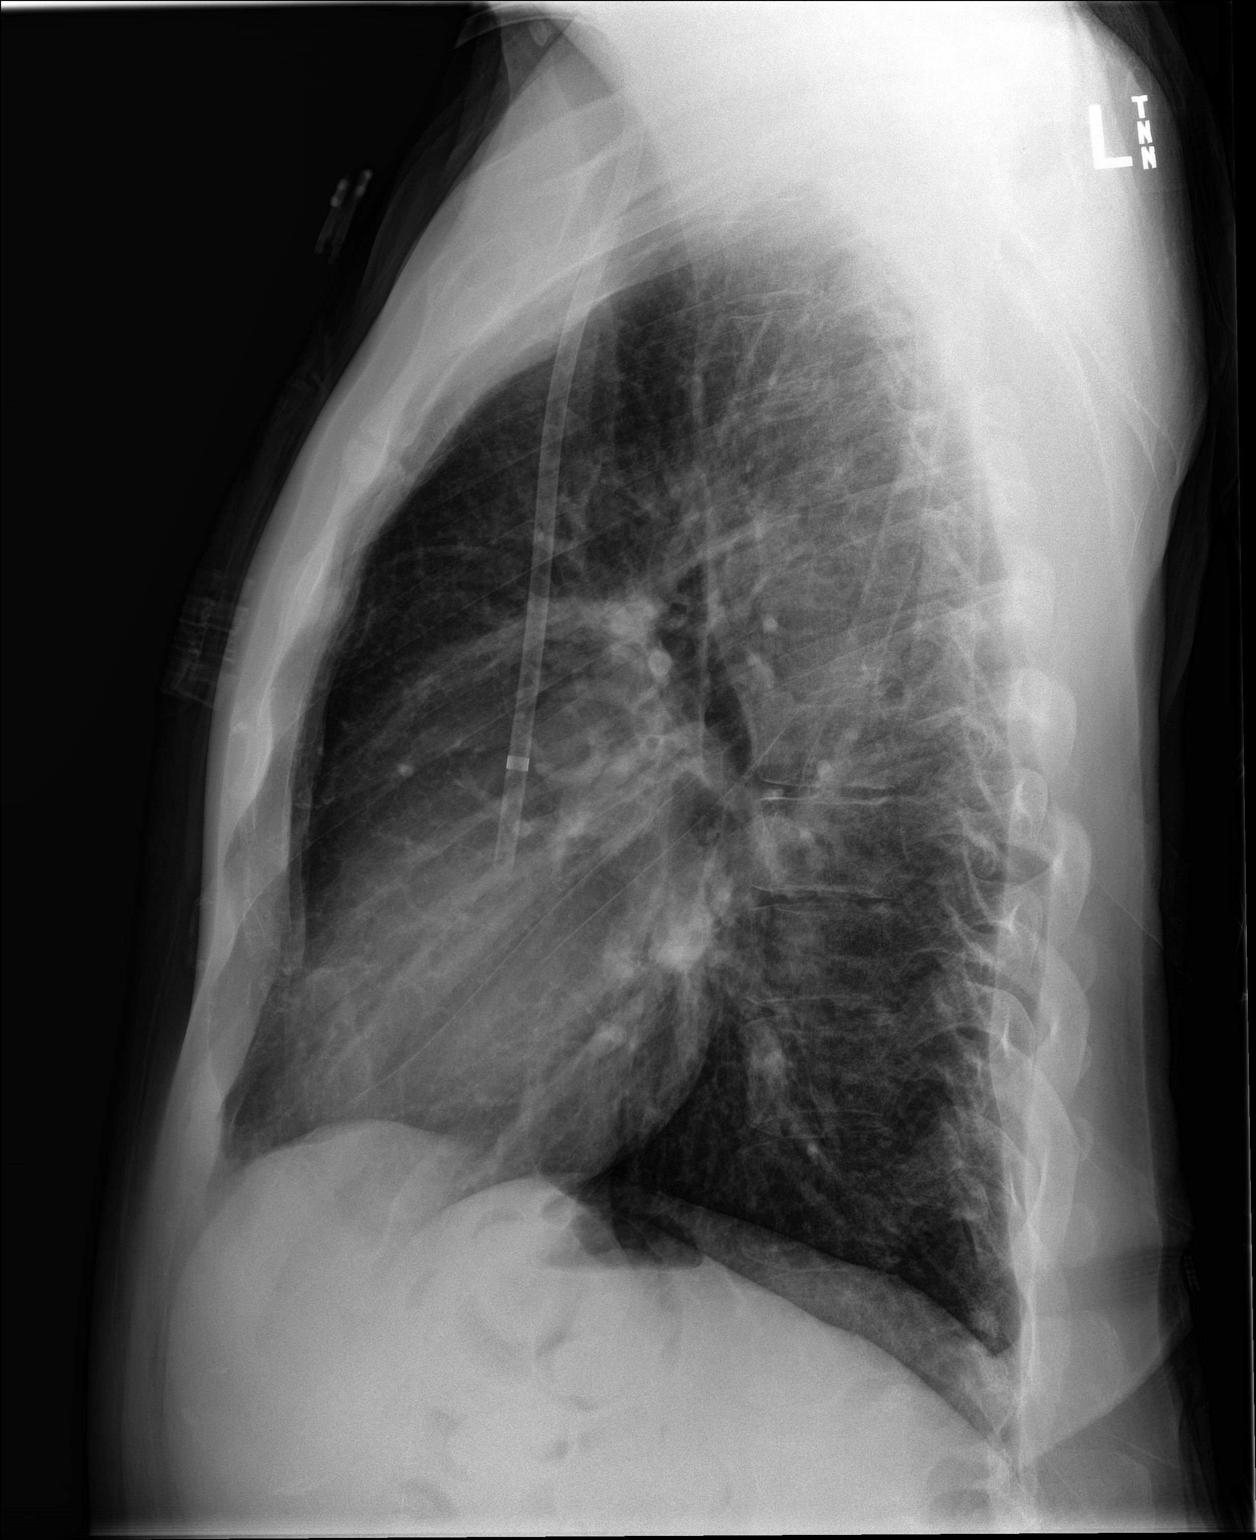

[2 of 2 positions shown; findings below may reference images not displayed]

FINDINGS: The heart size is normal. Dialysis catheter is stable. The lungs are
now clear. Previously seen left lower lobe pneumonia has cleared.
Emphysematous changes are again noted. Mild rightward curvature is
present in the mid thoracic spine.
IMPRESSION: 1. Interval clearing of left lower lobe airspace disease.
2. Emphysema.

## 2017-07-15 ENCOUNTER — Telehealth (INDEPENDENT_AMBULATORY_CARE_PROVIDER_SITE_OTHER): Payer: Self-pay | Admitting: Vascular Surgery

## 2017-07-15 NOTE — Telephone Encounter (Signed)
JESSICA CALLED WANTING Terrance Reynolds'S MEDICAL RECORDS ON WHERE HE HAD A PERMA CATH PUT IN BY DR [facility]. HER NUMBER IS [phone]

## 2017-07-15 NOTE — Telephone Encounter (Signed)
I fax over the op note the dialysis center was requesting

## 2017-07-15 NOTE — Telephone Encounter (Deleted)
I spoke with one of the nurses from [facility] and they called asking if the patient should keep her appointment with our office on 07/21/17 because she she will be getting a procedure done by their office on the same day.They have receive the clearance to get the surgery but the patient wasn't sure if she should keep the appointment with [facility].I told the nurse that will have to speak with [facility] and I will give there office a call on Monday.

## 2019-01-18 ENCOUNTER — Encounter (HOSPITAL_COMMUNITY): Payer: Self-pay

## 2019-01-18 ENCOUNTER — Emergency Department (HOSPITAL_COMMUNITY)
Admission: EM | Admit: 2019-01-18 | Discharge: 2019-01-18 | Disposition: A | Discharge status: Home / Self Care | Payer: Medicaid Other | Attending: Emergency Medicine | Admitting: Emergency Medicine

## 2019-01-18 ENCOUNTER — Emergency Department (HOSPITAL_COMMUNITY): Payer: Medicaid Other

## 2019-01-18 ENCOUNTER — Other Ambulatory Visit: Payer: Self-pay

## 2019-01-18 DIAGNOSIS — M79672 Pain in left foot: Secondary | ICD-10-CM | POA: Diagnosis not present

## 2019-01-18 DIAGNOSIS — N186 End stage renal disease: Secondary | ICD-10-CM | POA: Insufficient documentation

## 2019-01-18 DIAGNOSIS — F17210 Nicotine dependence, cigarettes, uncomplicated: Secondary | ICD-10-CM | POA: Insufficient documentation

## 2019-01-18 DIAGNOSIS — R21 Rash and other nonspecific skin eruption: Secondary | ICD-10-CM | POA: Insufficient documentation

## 2019-01-18 DIAGNOSIS — Z79899 Other long term (current) drug therapy: Secondary | ICD-10-CM | POA: Diagnosis not present

## 2019-01-18 DIAGNOSIS — Z992 Dependence on renal dialysis: Secondary | ICD-10-CM | POA: Insufficient documentation

## 2019-01-18 DIAGNOSIS — I120 Hypertensive chronic kidney disease with stage 5 chronic kidney disease or end stage renal disease: Secondary | ICD-10-CM | POA: Insufficient documentation

## 2019-01-18 LAB — CBC WITH DIFFERENTIAL/PLATELET
Abs Immature Granulocytes: 0.02 10*3/uL (ref 0.00–0.07)
Basophils Absolute: 0 10*3/uL (ref 0.0–0.1)
Basophils Relative: 1 %
Eosinophils Absolute: 0.3 10*3/uL (ref 0.0–0.5)
Eosinophils Relative: 5 %
HCT: 29 % — ABNORMAL LOW (ref 39.0–52.0)
Hemoglobin: 9 g/dL — ABNORMAL LOW (ref 13.0–17.0)
Immature Granulocytes: 0 %
Lymphocytes Relative: 21 %
Lymphs Abs: 1.3 10*3/uL (ref 0.7–4.0)
MCH: 29 pg (ref 26.0–34.0)
MCHC: 31 g/dL (ref 30.0–36.0)
MCV: 93.5 fL (ref 80.0–100.0)
Monocytes Absolute: 0.7 10*3/uL (ref 0.1–1.0)
Monocytes Relative: 12 %
Neutro Abs: 3.8 10*3/uL (ref 1.7–7.7)
Neutrophils Relative %: 61 %
Platelets: 194 10*3/uL (ref 150–400)
RBC: 3.1 MIL/uL — ABNORMAL LOW (ref 4.22–5.81)
RDW: 12.6 % (ref 11.5–15.5)
WBC: 6.2 10*3/uL (ref 4.0–10.5)
nRBC: 0 % (ref 0.0–0.2)

## 2019-01-18 LAB — BASIC METABOLIC PANEL
Anion gap: 16 — ABNORMAL HIGH (ref 5–15)
BUN: 57 mg/dL — ABNORMAL HIGH (ref 6–20)
CO2: 25 mmol/L (ref 22–32)
Calcium: 9.6 mg/dL (ref 8.9–10.3)
Chloride: 92 mmol/L — ABNORMAL LOW (ref 98–111)
Creatinine, Ser: 9.2 mg/dL — ABNORMAL HIGH (ref 0.61–1.24)
GFR calc Af Amer: 7 mL/min — ABNORMAL LOW (ref >60)
GFR calc non Af Amer: 6 mL/min — ABNORMAL LOW (ref >60)
Glucose, Bld: 115 mg/dL — ABNORMAL HIGH (ref 70–99)
Potassium: 5.9 mmol/L — ABNORMAL HIGH (ref 3.5–5.1)
Sodium: 133 mmol/L — ABNORMAL LOW (ref 135–145)

## 2019-01-18 NOTE — ED Provider Notes (Signed)
[facility] EMERGENCY DEPARTMENT Provider Note   CSN: [id number] Arrival date & time: 01/18/19  3710    History   Chief Complaint Chief Complaint  Patient presents with  . Foot Pain    HPI Terrance Reynolds is a 38 y.o. male.     HPI   Terrance Reynolds is a 38 y.o. male with a PMH of Hep C, ESRD on dialysis, psoriasis, thrombocytopenia, HTN and methadone therapy, who presents to the Emergency Department complaining of pain and numbness of the left foot.  symptoms have been present for two weeks.  Describes a "numb and tingly" sensation to his foot that is constant and pain along the lateral foot that worsens with weight bearing.  He has also noticed a red rash to the anterior left lower leg and to the dorsal great toe with chronic "dry skin.".  Rash is non-tender and does not itch.  He denies known injury, discoloration of his toes, fever, chills or joint pains.  He is scheduled for his routine dialysis today at 2:00 pm.     Past Medical History:  Diagnosis Date  . Acute on chronic renal failure (HCC)    ESRD TThSAT  . Anemia   . Anxiety   . Arthritis   . CKD (chronic kidney disease)   . Constipation   . Dialysis patient ([place])   . Diastolic dysfunction 04/12/9484.   Grade 2 per echo  . GERD (gastroesophageal reflux disease)   . Headache    Migraines- due to headache  . Hepatitis 2016   hepatitis c  . Hypertension   . 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 Migraines   . Psoriasis   . Shortness of breath dyspnea    when I had a lot of fluid on  . Substance abuse ([place])    methadone clinic  . Thrombocytopenia [facility])     Patient Active Problem List   Diagnosis Date Noted  . HCAP (healthcare-associated pneumonia) 07/25/2016  . Tobacco use disorder 07/25/2016  . Complication of vascular access for dialysis 06/30/2016  . Numbness-Left wrist 07/03/2015  . ESRD (end stage renal disease) ([place])   . Renal failure 04/14/2015  . Chronic kidney disease, stage V  ([place]) 04/14/2015  . Metabolic acidosis 46/27/0350  . Pulmonary vascular congestion 04/14/2015  . Bilateral leg edema 04/14/2015  . Malignant hypertension 04/14/2015  . 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 Diastolic dysfunction 09/38/1829  . Headache   . Hepatitis C 04/02/2015  . Acute renal failure ([place]) 04/01/2015  . Acute on chronic renal failure ([place]) 04/01/2015  . Hypertensive crisis 04/01/2015  . Elevated troponin 04/01/2015  . Dyspnea 04/01/2015  . Leg edema 04/01/2015  . Anemia 04/01/2015  . Thrombocytopenia ([place]) 04/01/2015  . Substance abuse ([place]) 04/01/2015    Past Surgical History:  Procedure Laterality Date  . AV FISTULA PLACEMENT Left 06/18/2015   Procedure: LEFT RADIOCEPHALIC ARTERIOVENOUS (AV) FISTULA CREATION;  Surgeon: Elam Dutch, MD;  Location: [place];  Service: Vascular;  Laterality: Left;  . AV FISTULA PLACEMENT Left 12/12/2015   Procedure: INSERTION OF ARTERIOVENOUS (AV) GORE-TEX GRAFT [place]  ( BRACH/AXILLARY GRAFT );  Surgeon: Katha Cabal, MD;  Location: [facility];  Service: Vascular;  Laterality: Left;  . [place] REMOVAL Left 06/30/2016   Procedure: REMOVAL OF ARTERIOVENOUS GORETEX GRAFT ([place]);  Surgeon: Katha Cabal, MD;  Location: [facility];  Service: Vascular;  Laterality: Left;  . BASCILIC VEIN TRANSPOSITION Left 07/09/2015   Procedure: LEFT  BRACHIOCEPHALIC ARTERIOVENOUS FISTULA;  Surgeon: Elam Dutch, MD;  Location: [place];  Service: Vascular;  Laterality: Left;  . CAPD INSERTION N/A 12/12/2015   Procedure: Removal of peritoneal dialysis catheter;  Surgeon: Katha Cabal, MD;  Location: [facility];  Service: Vascular;  Laterality: N/A;  . HEMATOMA EVACUATION Left 05/14/2016   Procedure: EVACUATION HEMATOMA ( DRAINAGE );  Surgeon: Katha Cabal, MD;  Location: [facility];  Service: Vascular;  Laterality: Left;  . INSERTION OF DIALYSIS CATHETER N/A 09/03/2015   Procedure: INSERTION OF PERITONEAL DIALYSIS CATHETER;  Surgeon:  Katha Cabal, MD;  Location: [facility];  Service: Vascular;  Laterality: N/A;  . PERIPHERAL VASCULAR CATHETERIZATION Left 04/23/2016   Procedure: A/V Shuntogram/Fistulagram;  Surgeon: Katha Cabal, MD;  Location: [place] [facility];  Service: Cardiovascular;  Laterality: Left;  . PERIPHERAL VASCULAR CATHETERIZATION N/A 04/23/2016   Procedure: A/V Shunt Intervention;  Surgeon: Katha Cabal, MD;  Location: [place] [facility];  Service: Cardiovascular;  Laterality: N/A;  . PERIPHERAL VASCULAR CATHETERIZATION Right 06/08/2016   Procedure: A/V Shuntogram/Fistulagram;  Surgeon: Katha Cabal, MD;  Location: [place] [facility];  Service: Cardiovascular;  Laterality: Right;  . PERIPHERAL VASCULAR CATHETERIZATION N/A 06/08/2016   Procedure: A/V Shunt Intervention;  Surgeon: Katha Cabal, MD;  Location: [place] [facility];  Service: Cardiovascular;  Laterality: N/A;  . permacath Right   . REVISION OF ARTERIOVENOUS GORETEX GRAFT Left 05/14/2016   Procedure: REVISION OF ARTERIOVENOUS GORETEX GRAFT ( [place] );  Surgeon: Katha Cabal, MD;  Location: [facility];  Service: Vascular;  Laterality: Left;        Home Medications    Prior to Admission medications   Medication Sig Start Date End Date Taking? Authorizing Provider  albuterol (PROVENTIL HFA;VENTOLIN HFA) 108 (90 Base) MCG/ACT inhaler Inhale 1-2 puffs into the lungs every 6 (six) hours as needed for wheezing or shortness of breath. 08/13/16   Boubacar Lerette, PA-C  amLODipine (NORVASC) 5 MG tablet Take 5 mg by mouth daily.    [provider]  cyclobenzaprine (FLEXERIL) 10 MG tablet Take 1 tablet (10 mg total) by mouth 3 (three) times daily as needed. 08/13/16   Oskar Cretella, PA-C  furosemide (LASIX) 80 MG tablet Take 80 mg by mouth daily.     [provider]  guaiFENesin (MUCINEX) 600 MG 12 hr tablet Take 1 tablet (600 mg total) by mouth 2 (two) times daily. 07/27/16   Kathie Dike, MD  hydrALAZINE  (APRESOLINE) 50 MG tablet Take 1 tablet (50 mg total) by mouth every 8 (eight) hours. 04/17/15   Black, Lezlie Octave, NP  labetalol (NORMODYNE) 300 MG tablet Take 1 tablet (300 mg total) by mouth 2 (two) times daily. 04/17/15   Black, Lezlie Octave, NP  levofloxacin (LEVAQUIN) 500 MG tablet Take 1 tablet (500 mg total) by mouth every other day. 07/27/16   Kathie Dike, MD  methadone (DOLOPHINE) 10 MG tablet Take 10 tablets (100 mg total) by mouth at bedtime. Patient taking differently: Take 120 mg by mouth daily.  07/27/16   Kathie Dike, MD  Multiple Vitamin (MULTIVITAMIN WITH MINERALS) TABS tablet Take 1 tablet by mouth daily.    [provider]  predniSONE (DELTASONE) 10 MG tablet Take 6 tablets day one, 5 tablets day two, 4 tablets day three, 3 tablets day four, 2 tablets day five, then 1 tablet day six 08/13/16   Malaya Cagley, PA-C  PRESCRIPTION MEDICATION Apply 1 application topically daily as needed. Cream  used for psoriasis.    [provider]  sevelamer carbonate (RENVELA) 800 MG tablet Take 800-1,600 mg by mouth 3 (three) times daily with meals. Take 2 tablets with meals, and 1 tablet with snacks    [provider]    Family History Family History  Problem Relation Age of Onset  . Diabetes Mother   . Heart disease Father   . Hypertension Father     Social History Social History   Tobacco Use  . Smoking status: Current Every Day Smoker    Packs/day: 1.00    Years: 15.00    Pack years: 15.00    Types: Cigarettes  . Smokeless tobacco: Never Used  . Tobacco comment: 10 years  off and on  Substance Use Topics  . Alcohol use: Not Currently  . Drug use: No    Comment: former IV drug user     Allergies   Penicillins   Review of Systems Review of Systems  Constitutional: Negative for chills and fever.  Respiratory: Negative for chest tightness and shortness of breath.   Cardiovascular: Negative for chest pain.  Musculoskeletal: Positive for  arthralgias (left foot pain and swelling). Negative for joint swelling.  Skin: Positive for rash. Negative for color change and wound.  Neurological: Positive for numbness (numbness and tingling of the toes of the left foot.). Negative for dizziness and weakness.     Physical Exam Updated Vital Signs BP (!) 187/106 (BP Location: Right Arm)   Pulse 87   Temp 99 F (37.2 C)   Resp 18   Ht 6\' 2"  (1.88 m)   Wt 81.6 kg   SpO2 97%   BMI 23.11 kg/m   Physical Exam Vitals signs and nursing note reviewed.  Constitutional:      Appearance: Normal appearance.  HENT:     Head: Atraumatic.  Cardiovascular:     Rate and Rhythm: Normal rate and regular rhythm.     Pulses: Normal pulses.     Comments: DP and PT pulses are strong and symmetrical bilaterally Pulmonary:     Effort: Pulmonary effort is normal.  Musculoskeletal: Normal range of motion.        General: No signs of injury.     Comments: Focal ttp of the lateral right foot.  No bony deformity.  Mild BLE edema.  No calf pain or edema.  Toes are warm and pink.    Skin:    General: Skin is warm.     Capillary Refill: Capillary refill takes less than 2 seconds.     Findings: Rash present.     Comments: erythematous, non-blanching rash to the left lower extremity.  Rash is nearly circumferential.  A smaller, similar appearing area to the dorsal left great toe at the base of the nail.  Remaining skin is warm and dry  Neurological:     General: No focal deficit present.     Mental Status: He is alert.     Sensory: No sensory deficit.     Motor: No weakness.     Comments: Gross sensation of the LE is intact.          ED Treatments / Results  Labs (all labs ordered are listed, but only abnormal results are displayed) Labs Reviewed  BASIC METABOLIC PANEL - Abnormal; Notable for the following components:      Result Value   Sodium 133 (*)    Potassium 5.9 (*)    Chloride 92 (*)    Glucose,  Bld 115 (*)    BUN 57 (*)     Creatinine, Ser 9.20 (*)    GFR calc non Af Amer 6 (*)    GFR calc Af Amer 7 (*)    Anion gap 16 (*)    All other components within normal limits  CBC WITH DIFFERENTIAL/PLATELET - Abnormal; Notable for the following components:   RBC 3.10 (*)    Hemoglobin 9.0 (*)    HCT 29.0 (*)    All other components within normal limits    EKG None  Radiology Dg Foot Complete Left  Result Date: 01/18/2019 CLINICAL DATA:  Pain and left foot for 2 weeks. EXAM: LEFT FOOT - COMPLETE 3+ VIEW COMPARISON:  None. FINDINGS: There is no evidence of fracture or dislocation. There is no evidence of significant arthropathy. No bony lesions or destruction. Soft tissues are unremarkable. IMPRESSION: Negative. Electronically Signed   By: Aletta Edouard M.D.   On: 01/18/2019 10:10    Procedures Procedures (including critical care time)  Medications Ordered in ED Medications - No data to display   Initial Impression / Assessment and Plan / ED Course  I have reviewed the triage vital signs and the nursing notes.  Pertinent labs & imaging results that were available during my care of the patient were reviewed by me and considered in my medical decision making (see chart for details).        Pt is 38 yo with multiple comorbities. and persistent rash to the LLE that is mostly circumferential.  No calf pain or edema to suggest DVT.  Extremity is NV intact, no motor or gross sensory deficits on my exam. He has a hx of psoriasis and sx's may be related to atypical psoriasis, also vasculitis considered. Discussed findings with Dr. Laverta Baltimore.  Pt has PCP and he is otherwise well appearing.  I feel he is appropriate for d/c home and he agrees to close PCP f/u and close observation. Restrict return precautions discussed.   Pt has scheduled appt for his dialysis this afternoon, and labs today support the need    Final Clinical Impressions(s) / ED Diagnoses   Final diagnoses:  Foot pain, left  Rash/skin eruption  End  stage renal disease on dialysis [facility])    ED Discharge Orders    None       Kem Parkinson, PA-C 01/18/19 1419    Long, Wonda Old[facility], MD 01/20/19 [id number]

## 2019-01-18 NOTE — Discharge Instructions (Addendum)
Be sure to follow-up with your primary provider next week for recheck.  Wear supportive shoes and elevate your leg when possible.  Return to ER for any worsening symptoms.  I have listed a local PCP and dermatologist for you as well

## 2019-01-18 NOTE — ED Triage Notes (Signed)
Pt reports pain in left foot for past 2 weeks.  Reports feet have been numb for " a while."  Also reports extremely dry skin.   Pt on dialysis, last treatment was Tuesday.  Is supposed to go to dialysis today at 2.

## 2019-09-06 ENCOUNTER — Other Ambulatory Visit (HOSPITAL_COMMUNITY): Payer: Self-pay | Admitting: Internal Medicine

## 2019-09-06 DIAGNOSIS — M7989 Other specified soft tissue disorders: Secondary | ICD-10-CM

## 2019-09-11 ENCOUNTER — Encounter (HOSPITAL_COMMUNITY): Payer: Self-pay

## 2019-09-11 ENCOUNTER — Ambulatory Visit (HOSPITAL_COMMUNITY): Admission: RE | Admit: 2019-09-11 | Payer: Medicaid Other | Source: Ambulatory Visit

## 2019-10-05 ENCOUNTER — Ambulatory Visit (HOSPITAL_COMMUNITY): Admission: RE | Admit: 2019-10-05 | Payer: Medicaid Other | Source: Ambulatory Visit

## 2020-07-18 DEATH — deceased
